# Patient Record
Sex: Female | Born: 1935 | Race: White | Hispanic: No | Marital: Married | Smoking: Never smoker
Health system: Southern US, Community
[De-identification: ages and names within clinical notes are randomized; demographics above are authoritative.]

## PROBLEM LIST (undated history)

## (undated) DIAGNOSIS — E785 Hyperlipidemia, unspecified: Secondary | ICD-10-CM

## (undated) DIAGNOSIS — I341 Nonrheumatic mitral (valve) prolapse: Secondary | ICD-10-CM

## (undated) DIAGNOSIS — Z950 Presence of cardiac pacemaker: Secondary | ICD-10-CM

## (undated) DIAGNOSIS — I4892 Unspecified atrial flutter: Secondary | ICD-10-CM

## (undated) DIAGNOSIS — I4891 Unspecified atrial fibrillation: Secondary | ICD-10-CM

## (undated) DIAGNOSIS — K219 Gastro-esophageal reflux disease without esophagitis: Secondary | ICD-10-CM

## (undated) DIAGNOSIS — I422 Other hypertrophic cardiomyopathy: Secondary | ICD-10-CM

## (undated) DIAGNOSIS — R569 Unspecified convulsions: Secondary | ICD-10-CM

## (undated) DIAGNOSIS — I495 Sick sinus syndrome: Secondary | ICD-10-CM

## (undated) DIAGNOSIS — C4491 Basal cell carcinoma of skin, unspecified: Secondary | ICD-10-CM

## (undated) DIAGNOSIS — Z8679 Personal history of other diseases of the circulatory system: Secondary | ICD-10-CM

## (undated) DIAGNOSIS — I471 Supraventricular tachycardia, unspecified: Secondary | ICD-10-CM

## (undated) DIAGNOSIS — I517 Cardiomegaly: Secondary | ICD-10-CM

## (undated) HISTORY — DX: Supraventricular tachycardia: I47.1

## (undated) HISTORY — DX: Presence of cardiac pacemaker: Z95.0

## (undated) HISTORY — PX: BASAL CELL CARCINOMA EXCISION: SHX1214

## (undated) HISTORY — DX: Sick sinus syndrome: I49.5

## (undated) HISTORY — DX: Supraventricular tachycardia, unspecified: I47.10

## (undated) HISTORY — DX: Other hypertrophic cardiomyopathy: I42.2

## (undated) HISTORY — DX: Hyperlipidemia, unspecified: E78.5

## (undated) HISTORY — DX: Basal cell carcinoma of skin, unspecified: C44.91

## (undated) HISTORY — DX: Personal history of other diseases of the circulatory system: Z86.79

## (undated) HISTORY — DX: Unspecified atrial flutter: I48.92

## (undated) HISTORY — DX: Gastro-esophageal reflux disease without esophagitis: K21.9

## (undated) HISTORY — PX: ABDOMINAL HYSTERECTOMY: SHX81

## (undated) HISTORY — DX: Cardiomegaly: I51.7

---

## 2001-04-03 ENCOUNTER — Encounter: Payer: Self-pay | Admitting: Emergency Medicine

## 2001-04-03 ENCOUNTER — Emergency Department (HOSPITAL_COMMUNITY): Admission: EM | Admit: 2001-04-03 | Discharge: 2001-04-03 | Payer: Self-pay | Admitting: Emergency Medicine

## 2002-03-29 ENCOUNTER — Encounter: Payer: Self-pay | Admitting: General Surgery

## 2002-03-30 ENCOUNTER — Observation Stay (HOSPITAL_COMMUNITY): Admission: RE | Admit: 2002-03-30 | Discharge: 2002-03-30 | Payer: Self-pay | Admitting: General Surgery

## 2002-03-30 ENCOUNTER — Encounter: Payer: Self-pay | Admitting: General Surgery

## 2006-07-12 ENCOUNTER — Ambulatory Visit: Payer: Self-pay | Admitting: *Deleted

## 2006-07-19 ENCOUNTER — Ambulatory Visit: Payer: Self-pay

## 2006-07-19 ENCOUNTER — Encounter: Payer: Self-pay | Admitting: Cardiovascular Disease

## 2006-07-19 ENCOUNTER — Ambulatory Visit: Payer: Self-pay | Admitting: *Deleted

## 2006-08-18 ENCOUNTER — Ambulatory Visit: Payer: Self-pay | Admitting: *Deleted

## 2009-03-28 ENCOUNTER — Encounter: Payer: Self-pay | Admitting: Internal Medicine

## 2009-04-04 ENCOUNTER — Encounter: Payer: Self-pay | Admitting: Internal Medicine

## 2009-05-18 ENCOUNTER — Encounter: Payer: Self-pay | Admitting: Internal Medicine

## 2009-07-06 ENCOUNTER — Encounter: Payer: Self-pay | Admitting: Internal Medicine

## 2009-09-11 ENCOUNTER — Encounter: Payer: Self-pay | Admitting: Internal Medicine

## 2009-10-01 ENCOUNTER — Ambulatory Visit: Payer: Self-pay | Admitting: Internal Medicine

## 2009-10-01 ENCOUNTER — Encounter (INDEPENDENT_AMBULATORY_CARE_PROVIDER_SITE_OTHER): Payer: Self-pay | Admitting: *Deleted

## 2009-10-01 DIAGNOSIS — R002 Palpitations: Secondary | ICD-10-CM

## 2009-10-01 HISTORY — DX: Palpitations: R00.2

## 2009-10-30 ENCOUNTER — Telehealth (INDEPENDENT_AMBULATORY_CARE_PROVIDER_SITE_OTHER): Payer: Self-pay | Admitting: *Deleted

## 2009-11-15 ENCOUNTER — Ambulatory Visit: Payer: Self-pay

## 2009-11-15 ENCOUNTER — Ambulatory Visit: Payer: Self-pay | Admitting: Internal Medicine

## 2010-01-06 ENCOUNTER — Telehealth: Payer: Self-pay | Admitting: Internal Medicine

## 2010-08-13 ENCOUNTER — Encounter: Payer: Self-pay | Admitting: Internal Medicine

## 2010-09-15 ENCOUNTER — Telehealth: Payer: Self-pay | Admitting: Internal Medicine

## 2010-10-07 ENCOUNTER — Telehealth: Payer: Self-pay | Admitting: Internal Medicine

## 2010-11-17 ENCOUNTER — Ambulatory Visit
Admission: RE | Admit: 2010-11-17 | Discharge: 2010-11-17 | Payer: Self-pay | Source: Home / Self Care | Attending: Internal Medicine | Admitting: Internal Medicine

## 2010-11-17 DIAGNOSIS — Q254 Other congenital malformations of aorta: Secondary | ICD-10-CM | POA: Insufficient documentation

## 2010-11-17 DIAGNOSIS — I10 Essential (primary) hypertension: Secondary | ICD-10-CM | POA: Insufficient documentation

## 2010-11-17 DIAGNOSIS — I471 Supraventricular tachycardia: Secondary | ICD-10-CM | POA: Insufficient documentation

## 2010-12-01 ENCOUNTER — Encounter: Payer: Self-pay | Admitting: Internal Medicine

## 2010-12-01 ENCOUNTER — Ambulatory Visit: Admission: RE | Admit: 2010-12-01 | Discharge: 2010-12-01 | Payer: Self-pay | Source: Home / Self Care

## 2010-12-02 NOTE — Progress Notes (Signed)
Summary: Carla Flowers** Monitor report   Phone Note Call from Patient Call back at Home Phone [phone] Call back at [phone]   Caller: Patient Reason for Call: Talk to Nurse Summary of Call: wants to make sure Monitor report was faxed to Dr Joellyn Quails Initial call taken by: Migdalia Dk,  January 06, 2010 12:24 PM  Follow-up for Phone Call        Pt wanting monitor results sent to another MD. The results I received is only for a baseline transmission. Per Carla Flowers, she was told the pt didn't wear the monitor and sent it back. Per the pt she wore it from Jan. 14 until Feb 11th or 12th. I will check with Carla Flowers and call the pt back.  Follow-up by: Duncan Dull, RN, BSN,  January 06, 2010 12:53 PM  Additional Follow-up for Phone Call Additional follow up Details #1::        Carla Flowers called the company and per the company the pt did not send in any events and they never received the monitor back. Passed this information along to the pt and she said she did not hit the button because she never had any events. I explained to her if there are no events then there is nothing to send to another MD. I gave her the phone number [phone] to call the company. She states she did send it back.  Additional Follow-up by: Duncan Dull, RN, BSN,  January 07, 2010 9:31 AM     Appended Document: changed provider to see dr. Vertell Novak - this patient apparently wants to switch over to see me. I don't know her and her issues look to be rhythm related. I'm not inclined to see her unless there's something I'm missing? Kathlene November  Appended Document: change provider spoke w/pt and explained that her needs required an EP Cardiologist. Pt very concerned that Dr. Excell Seltzer would not want to see her on 1/5  and will call back. Claris Gladden, RN, BSN

## 2010-12-02 NOTE — Assessment & Plan Note (Signed)
Summary: 6WK F/U EVENT MONITOR   CC:  6 week follow up on event monitor.  Pt did not get the event monitor as she wanted to wait until after Christmas.  .  History of Present Illness:  Mrs Erkkila is seen in followup for "SVT" and palpitatoins, the latter which have been quite disruptive to life. An event recorder scheduled was delayed by the patient untila fter Christmas, but she came anyway.l  ASerendipitously she had another episode of her "SVT aweek or so ago, but what she describes is 180bpm with BP 180 and spell that lasted 15 minutes or so, but in fact was 5 secs with a pause follwoed by 5-10 sec with a pause     We reviewed records from [facility] which unfortunately do not have any of the rhythm issues to rview  Current Medications (verified): 1)  Simvastatin 20 Mg Tabs (Simvastatin) .... Take One Tablet At Bedtime 2)  Coenzyme Q10 100 Mg Caps (Coenzyme Q10) .... Take One Capsule Once Daily 3)  Vitamin D (Ergocalciferol) 50000 Unit Caps (Ergocalciferol) .... Take One Capsule Once Weekly 4)  Metoprolol Succinate 25 Mg Xr24h-Tab (Metoprolol Succinate) .... Take One Tablet Once Daily 5)  Mag-Ox 400 400 Mg Tabs (Magnesium Oxide) .... Take One Tablet Daily 6)  Levetiracetam 500 Mg Tabs (Levetiracetam) .... Take One Tablet Two Times A Day 7)  Aspirin 325 Mg Tabs (Aspirin) .... Once Daily  Allergies (verified): 1)  ! Sulfa 2)  ! Cortisone 3)  ! Macrobid 4)  ! Codeine  Past History:  Past Medical History: Last updated: 10/01/2009 hyperlipidemia seizure disorder??   PSVT GERD Hx recurrent UTI and gallstones palpitations question PVCs rheumatic fever  Past Surgical History: Last updated: 10/01/2009 hysterectomy  Family History: Last updated: 09/30/2009 Son with Epilepsy  Social History: Last updated: 10/01/2009 Married-one son Retired  Tobacco Use - No.  Alcohol Use - no  Vital Signs:  Patient profile:   77 year old female Height:      65 inches Weight:       162 pounds Pulse rate:   66 / minute Pulse rhythm:   regular BP sitting:   128 / 78  (left arm) Cuff size:   regular  Vitals Entered By: Judithe Modest CMA (November 15, 2009 2:15 PM)  Physical Exam  General:  The patient was alert and oriented in no acute distress. HEENT Normal.  Neck veins were flat, carotids were brisk.  Lungs were clear.  Heart sounds were regular without murmurs or gallops.  Abdomen was soft with active bowel sounds. There is no clubbing cyanosis or edema. Skin Warm and dry    Impression & Recommendations:  Problem # 1:  PALPITATIONS (ICD-785.1) We will get hte event recorder.  The data from [facility] were unhelpful  The interrupted tachypalpiations could be anything, atrail tach, fib, svt  will have to wait and see Her updated medication list for this problem includes:    Metoprolol Succinate 25 Mg Xr24h-tab (Metoprolol succinate) .Marland Kitchen... Take one tablet once daily    Aspirin 325 Mg Tabs (Aspirin) ..... Once daily  Patient Instructions: 1)  HAVE MONITOR PLACED TODAY. 2)  Your physician recommends that you schedule a follow-up appointment in: 5 WEEKS

## 2010-12-02 NOTE — Progress Notes (Signed)
Summary: Questions about appt  Phone Note Call from Patient Call back at Home Phone [phone] Call back at [phone]   Caller: Patient Summary of Call: Pt calling with questions about seeing Dr.Cooper Initial call taken by: Judie Grieve,  October 07, 2010 9:16 AM  Follow-up for Phone Call        pt will see another EP MD in the practice and have scheduled an appt for 1/16 w/Dr. Johney Frame.  Follow-up by: Claris Gladden RN,  October 07, 2010 9:34 AM

## 2010-12-04 NOTE — Progress Notes (Addendum)
Summary: change Carla Flowers  Phone Note Call from Patient Call back at Home Phone [phone]   Caller: Patient Reason for Call: Talk to Nurse Details for Reason: pt would like to changed from dr. Graciela Husbands to see dr. Excell Seltzer. pls advise.  Initial call taken by: Lorne Skeens,  September 15, 2010 9:50 AM  Follow-up for Phone Call        spoke w/pt and adv that it is ok to switch to Dr. Excell Seltzer. Follow-up by: Claris Gladden RN,  September 15, 2010 11:48 AM     Appended Document: changed Carla Flowers to see dr. Vertell Novak - this patient apparently wants to switch over to see me. I don't know her and her issues look to be rhythm related. I'm not inclined to see her unless there's something I'm missing? Kathlene November  Appended Document: change Carla Flowers spoke w/pt and explained that her needs required an EP Cardiologist. Pt very concerned that Dr. Excell Seltzer would not want to see her on 1/5  and will call back. Claris Gladden, RN, BSN

## 2010-12-04 NOTE — Assessment & Plan Note (Signed)
Summary: nep-previously saw Dr. Graciela Husbands   Visit Type:  Follow-up Primary Provider:  Dr Fernande Boyden   History of Present Illness: The patient presents today for routine electrophysiology followup. She was last seen by Dr Graciela Husbands one year ago for adenosine sensitive SVT.  She reports doing very well with metoprolol and has had no further symptomatic SVT over the past year.   The patient denies symptoms of palpitations, chest pain, shortness of breath, orthopnea, PND, lower extremity edema, dizziness, presyncope, syncope, or neurologic sequela. The patient is tolerating medications without difficulties and is otherwise without complaint today.   She recently had an echo obtained by her PCP which revealed EF 60-65% with mild asymmetric septal hypertrophy (septum 1.3-1.5cm) with no sam or LVOT obstruction.  She also had diastolic dysfunction.  Her RV size and function were normal.  She was noted to have mild MR with redundant subvalvular apparatus.  Her ascending aorta was 3.7cm in size.  She is referred back to our office for further evaluation of these findings.  Current Medications (verified): 1)  Simvastatin 20 Mg Tabs (Simvastatin) .... Take One Tablet At Bedtime 2)  Coenzyme Q10 100 Mg Caps (Coenzyme Q10) .... Take One Capsule Once Daily 3)  Vitamin D (Ergocalciferol) 50000 Unit Caps (Ergocalciferol) .... Take One Capsule Once Weekly 4)  Metoprolol Succinate 25 Mg Xr24h-Tab (Metoprolol Succinate) .... Take One Tablet Once Daily 5)  Mag-Ox 400 400 Mg Tabs (Magnesium Oxide) .... Take One Tablet Daily 6)  Levetiracetam 500 Mg Tabs (Levetiracetam) .... Take One Tablet Two Times A Day 7)  Aspirin 325 Mg Tabs (Aspirin) .... Once Daily  Allergies: 1)  ! Sulfa 2)  ! Cortisone 3)  ! Macrobid 4)  ! Codeine  Past History:  Past Medical History: hyperlipidemia seizure disorder??   PSVT prevsiously responded to adenosine GERD Hx recurrent UTI and gallstones palpitations question  PVCs rheumatic fever ascending aorta 3.7cm asymmetric septal hypertrophy on echo  09/03/10  Past Surgical History: Reviewed history from 10/01/2009 and no changes required. hysterectomy  Family History: Son with Epilepsy denies h/o arrhythmia, sudden death, aortic aneurysm, or hypertrophy  Social History: Reviewed history from 10/01/2009 and no changes required. Married-one son Retired  Tobacco Use - No.  Alcohol Use - no  Review of Systems       All systems are reviewed and negative except as listed in the HPI.   Vital Signs:  Patient profile:   77 year old female Height:      65 inches Weight:      163 pounds BMI:     27.22 Pulse rate:   64 / minute BP sitting:   146 / 70  (left arm)  Vitals Entered By: Laurance Flatten CMA (November 17, 2010 9:01 AM)  Physical Exam  General:  Well developed, well nourished, in no acute distress. Head:  normocephalic and atraumatic Eyes:  PERRLA/EOM intact; conjunctiva and lids normal. Mouth:  Teeth, gums and palate normal. Oral mucosa normal. Neck:  Neck supple, no JVD. No masses, thyromegaly or abnormal cervical nodes. Lungs:  Clear bilaterally to auscultation and percussion. Heart:  Non-displaced PMI, chest non-tender; regular rate and rhythm, S1, S2 without murmurs, rubs or gallops. Carotid upstroke normal, no bruit. Normal abdominal aortic size, no bruits. Femorals normal pulses, no bruits. Pedals normal pulses. No edema, no varicosities. Abdomen:  Bowel sounds positive; abdomen soft and non-tender without masses, organomegaly, or hernias noted. No hepatosplenomegaly. Msk:  Back normal, normal gait. Muscle strength and tone normal. Extremities:  No clubbing or cyanosis. Neurologic:  Alert and oriented x 3. Skin:  Intact without lesions or rashes. Psych:  Normal affect.   EKG  Procedure date:  09/03/2010  Findings:      EF 60-65% with mild asymmetric septal hypertrophy (septum 1.3-1.5cm) with no sam or LVOT obstruction.  She  also had diastolic dysfunction.  Her RV size and function were normal.  She was noted to have mild MR with redundant subvalvular apparatus.  Her ascending aorta was 3.7cm in size.   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  We will perform ultrasound to formally evaluate her aorta. We will continue beta blocker therapy.  Problem # 3:  HYPERTENSION, BENIGN (ICD-401.1) stable her echo reveals mild asymmetric septal LVH, though she does not have obstructive CM We will continue to follow with serial echos We will likely repeat echo upon return.  Other Orders: Abdominal Aorta Duplex (Abd Aorta Duplex)  Patient Instructions: 1)  Your physician wants you to follow-up in: 6 months with Dr Jacquiline Doe will receive a reminder letter in the mail two months in advance. If you don't receive a letter, please call our office to schedule the follow-up appointment. 2)  Your physician has requested that you have an abdominal aorta duplex. During this test, an ultrasound is used to evaluate the aorta. Allow 30 minutes for this exam. Do not eat after midnight the day before and avoid carbonated beverages. There are no restrictions or special instructions. 3)  Your physician has requested that you limit the intake of sodium (salt) in your diet to four grams daily. Please see [facility] handout.

## 2010-12-08 ENCOUNTER — Telehealth: Payer: Self-pay | Admitting: Internal Medicine

## 2010-12-10 NOTE — Letter (Signed)
Summary: [facility] 2010-2011  [facility] 2010-2011   Imported By: Marylou Mccoy 11/27/2010 12:30:46  _____________________________________________________________________  External Attachment:    Type:   Image     Comment:   External Document

## 2010-12-18 NOTE — Progress Notes (Signed)
Summary: pt rtn call from last week  Phone Note Call from Patient Call back at [phone]   Caller: Patient Reason for Call: Talk to Nurse, Talk to Doctor, Lab or Test Results Summary of Call: pt rtn call from friday to get test results Initial call taken by: Omer Jack,  December 08, 2010 8:41 AM  Follow-up for Phone Call        Phone Call Completed PT AWARE OF U/S RESULTS. Follow-up by: Scherrie Bateman, LPN,  December 08, 2010 8:48 AM

## 2011-03-20 NOTE — Consult Note (Signed)
[facility]  Patient:    Carla Flowers, Carla Flowers Visit Number: [id number] MRN: [id number]          Service Type: SUR Location: 4W [id number] Attending Physician:  Henrene Dodge Dictated by:   Oley Balm Sung Amabile, M.D. [facility] Proc. Date: 03/30/02 Admit Date:  03/30/2002 Discharge Date: 03/30/2002   CC:         Anselm Pancoast. Zachery Dakins, M.D.  Jamey Reas, M.D., [facility], [address], [address],             [place], [place] [address]   Consultation Report  DATE OF BIRTH:  20-Aug-1932  REQUESTING PHYSICIAN:  Anselm Pancoast. Zachery Dakins, M.D.  REASON FOR CONSULTATION:  Right lower lobe infiltrate on preoperative chest x-ray.  HISTORY OF PRESENT ILLNESS:  Carla Flowers is a previously healthy 77 year old woman whose history of present illness dates back to three or four days ago when she developed right upper quadrant and right lower thoracic pain.  This was a sharp pain radiating to her right shoulder.  It was associated with nausea and vomiting.  She had fevers to 101 degrees.  She was seen in the emergency department at [facility] in [place], [place].  At that time, apparently no chest x-ray was performed.  She states that she did have "blood work" done and was told that she suffered from a gallbladder problem.  She was started on Augmentin and saw her Primary Care Physician, Dr. Cyndia Bent, on the following day.  Dr. Cyndia Bent referred her to Dr. Zachery Dakins on the day prior to this consultation, and Dr. Zachery Dakins felt that she needed cholecystectomy.  Preoperative chest x-ray and blood work were performed yesterday and she presented for surgery on the morning of this consultation. The anesthesiologist noted the right lower lobe infiltrate on chest x-ray and was concerned about pneumonia.  Subsequently, an ultrasound of the gallbladder was obtained as was a CT scan of the chest to better delineate the  primary process here.  I am asked to decide on proper management of apparent pneumonia now.  The patient describes no hemoptysis or sputum production.  She describes pleuritic chest pain underneath her right breast and radiating to her right shoulder.  She has no lower extremity edema or calf tenderness.  PAST MEDICAL HISTORY:  Also notable for mitral valve prolapse and gastroesophageal reflux disease.  CURRENT MEDICATIONS: 1. Protonix. 2. Premarin. 3. Multivitamin.  ALLERGIES: 1. MACROBID. 2. SULFA. 3. ERYTHROMYCIN and related medications.  SOCIAL HISTORY:  No significant occupational exposures.  She has never smoked.  FAMILY HISTORY:  Noncontributory.  REVIEW OF SYSTEMS:  As per the history of present illness, and otherwise is negative.  PHYSICAL EXAMINATION:  VITAL SIGNS:  Presently afebrile with normal vital signs.  Room air oxygen saturation is 89%.  GENERAL:  She is in no acute cardiac or respiratory distress.  HEAD/NECK:  No acute abnormalities.  Jugular venous pulsations are normal. There is no palpable adenopathy.  CHEST:  Dullness to percussion in the right base.  Breath sounds are full with faint crackles heard in the left base, and bronchial breath sounds noted in the right base.  No wheezes are noted.  CARDIAC:  Regular rate and rhythm with a soft systolic murmur heard along the left sternal border and radiating to the apex.  ABDOMEN:  Soft with normal bowel sounds.  Murphy sign is positive.  EXTREMITIES:  No cyanosis, clubbing or edema.  NEUROLOGIC:  No deficits.  DATA:  CHEST X-RAY:  Right lower lobe atelectasis versus infiltrate.  CT SCAN:  Very definite consolidative changes in the right lower lobe and right middle lobe with air bronchograms.  There is a very small right pleural effusion also noted.  GALLBLADDER ULTRASOUND:  Reviewed with radiology and she has gallstones but no definite findings of acute inflammation.  IMPRESSION:  She  either has cholecystitis with right lower lobe atelectasis and a sympathetic right effusion, or her initial presentation is explained by right lower lobe and right middle lobe pneumonia.  Given the gallbladder findings and the CT scan findings, I favor that this is pneumonia.  Of course, she does require cholecystectomy at some point due to the gallstones.  PLAN/RECOMMENDATIONS: 1. Tequin 400 mg a day for seven days. 2. I recommend that we postpone her procedure until early next week.  I have    discussed this with Dr. Zachery Dakins.  We both feel comfortable sending her    home as long as we assure her that she must call back if her symptoms worse    at any time.  She is to call back to Dr. Vella Redhead office on the day    following this consultation to finalize the plans for cholecystectomy next    week.  It is important to note that her chest x-ray will not have cleared    by that time but she will have had sufficient antibiotics in her to safely    undergo cholecystectomy.  If there is any question regarding this, I am    more than happy to see her again in consultation. 3. She does have quite severe pleuritic chest pain.  I recommend that she use    an over the counter nonsteroidal such as Aleve or Advil.  In addition, I    will give her a small amount of Percocet for pain that breaks through the    nonsteroidals.  She also might require the opiate analgesics to achieve a    good nights rest. Dictated by:   Oley Balm. Sung Amabile, M.D. [facility] Attending Physician:  Henrene Dodge DD:  03/30/02 TD:  03/31/02 Job: [id number]

## 2011-03-20 NOTE — Assessment & Plan Note (Signed)
[facility]                              CARDIOLOGY OFFICE NOTE   NAME:Flowers, Carla FOODY                     MRN:          [id number]  DATE:07/12/2006                            DOB:          1932-05-17    The patient is a very pleasant, 77 year old, white female, with a long  history of mitral valve prolapse, tachy-palpitations, which have increased  recently.  She was most recently seen in the Cardiology Clinic by Dr. Loraine Leriche  Pulsipher.  She also has some hypertension.  Most recent stress Cardiolite  in 2001 was negative.  The patient states that she has noticed increasing  symptoms of palpitations with some presyncopal symptoms.  This starts out as  skipped beats and then appears to be a regular rapid rate.  She states that  cold milk helps that tentatively.  It usually slows initially and then  stops.  She has no chest discomfort.   PAST MEDICAL HISTORY:  1. Total hysterectomy in 1986.  2. Polypectomies in the past.  3. Hypertension in the past three months.   ALLERGIES:  1. SULFA.  2. MACROBID.  3. CORTISONE.   She does not smoke, drink, or use other drugs.  Her only medication is  aspirin 81.   REVIEW OF SYSTEMS:  Dizziness and presyncope most of her life sometimes  associated with arrhythmia.  ENT:  History of tinnitus.  CARDIORESPIRATORY:  As noted in the Present Illness.  GI:  Constipation and reflux; gallstones.  GU:  Frequency.  GYNECOLOGIC:  Complete hysterectomy.  Weight has been  stable.  MUSCULOSKELETAL:  History of arthritis.  Seasonal allergies.  The  patient has had difficulty with her memory recently.  She has had  hyperlipidemia but is not agreeable to taking a Statin as yet.   SOCIAL HISTORY:  She is retired.  She is married and has four children.   FAMILY HISTORY:  Her father died of a CVA at 11.  Mother died at 31 of heart  failure and hypertension.  One brother died in a motor vehicle accident.  One sister is 73 and  has mitral valve prolapse.   PHYSICAL EXAMINATION:  VITAL SIGNS:  Blood pressure 160/88, pulse 64 and in  normal sinus rhythm.  GENERAL APPEARANCE:  Normal.  NECK:  JVD is not elevated.  Carotid pulses are palpable without bruit.  LUNGS:  Clear.  CARDIAC:  1/6 short systolic murmur at the apex, I do not hear a click or a  gallop.  ABDOMEN:  Unremarkable.  EXTREMITIES:  Normal.  NEUROLOGIC:  Unremarkable.   ADDITIONAL DATA:  Laboratory data from [facility] reveals a  normal renal profile.  Enzymes were negative.  EKG revealed nonspecific ST T  abnormality in 1 and aVL, and the lateral leads.  Chest x-ray was  unremarkable.  CT scan of the head revealed no significant change.  A  carotid duplex ultrasound revealed no carotid disease or no hemodynamically  significant lesions.   IMPRESSION:  1. Tachy-palpitations.  2. History of mitral valve prolapse not documented recently.  3. Hyperlipidemia, untreated.  4.  Hypertension.   The patient is scheduled for a stress test next week.  I also plan to get a  two-D echo and an event monitor, and I will see the patient back in two to  three weeks.  At that time, hopefully we can initiate a Statin as well as  antihypertensive therapy, and document her arrhythmia.                              Cecil Cranker, MD, [facility]    EJL/MedQ  DD:  07/12/2006  DT:  07/12/2006  Job #:  [id number]   cc:   Carla Flowers, M.D.

## 2011-03-20 NOTE — H&P (Signed)
[facility]  Patient:    Carla Flowers, Carla Flowers Visit Number: [id number] MRN: [id number]          Service Type: SUR Location: [id number] Attending Physician:  Henrene Dodge Dictated by:   Anselm Pancoast. Zachery Dakins, M.D. Admit Date:  03/30/2002 Discharge Date: 03/30/2002                           History and Physical  CHIEF COMPLAINT:  Gallstones with pain.  HISTORY OF PRESENT ILLNESS:  Carla Flowers is a 77 year old Caucasian female who lives now in [place], previously lived here in [place], and has a known history of gallstones since an x-ray in 1997.  The patient started having pain in the upper right quadrant of the abdomen on Sunday, and the following day had no improvement.  She was seen at the [facility] by Dr. Cyndia Bent who describes severe pain in the right upper abdomen under the rib cage and radiating towards her back, hurts to breathe, and made the diagnosis of acute cholelithiasis.  White count was 13,400.  She desired to come to [facility] for gallbladder surgery.  She was seen in the office yesterday afternoon at approximately 4 p.m.  On examination, she was afebrile with tenderness in the right upper quadrant of her abdomen.  She did not appear toxic.  She has been on Augmentin, she thinks 500 mg q.8h, is probably 875 mg.  She does not have the tablets.  She wanted to proceed with gallbladder management.  The patient questioned whether or not the stones could still be present.  I assured her they were, but she desired a repeat ultrasound, and I sent her over to x-ray for her preoperative lab studies, chest x-ray, plus I made arrangements to repeat her ultrasound of her gallbladder.  This was performed.  The gallbladder shows a big dilated gallbladder with numerous stones, one impacted in the neck.  The ultrasound otherwise was normal in regards to her liver.  Chest x-ray showed atelectasis versus early pneumonia  right lower lobe, and the x-ray report was called late in the evening.  I received a message at approximately midnight.  Dr. Gladis Riffle, the anesthesiologist, stated at that time that he was going to counsel her, and I recommended we reassess her in the morning when she arrives for her surgery as she had been scheduled for 7:30, and make the decision at that time.  The patient arrived.  Her laboratory studies done yesterday showed a white count of 12,400.  She does not smoke cigarettes.  She has not had a recent chest x-ray, and she has not had any sputum production or shortness of breath.  She says it hurts when she takes a deep breath, and the radiologist, Dr. Ronney Asters, today interpreted the films as atelectasis of the right lower lobe, cannot rule out pneumonia, but not that of a pneumonia as was questioned by the radiologist last night.  The patient has had no chronic problems with pneumonias definite COPD.  SOCIAL HISTORY:  She used to work at [employer].  I think her husband also worked at [employer], but they retired and moved to the [place], [place], area several years ago.  PAST SURGICAL HISTORY:  She has had GYN surgery through a lower midline incision, hysterectomy.  PRESENT MEDICATIONS: 1. Premarin 0.625 mg daily. 2. Protonix daily.  PAST MEDICAL HISTORY:  There is a questionable history of hiatal hernia.  ALLERGIES:  She says CORTISONE, SULFA, and "MYCINS" cause problems.  REVIEW OF SYSTEMS:  Musculoskeletal: She denies problems.  Urinary: She denies urinary symptoms.  GI: She does have a history of diverticulosis but says she has not had any episodes of acute diverticulitis or been treated with antibiotics for lower abdominal pain.  PHYSICAL EXAMINATION:  GENERAL:  She is an elderly female, cooperative, in no acute distress at this time.  VITAL SIGNS:  Here in the room, temperature 97.2, pulse 80, respirations 16, blood pressure 130/90, O2 saturation 93%.   She  is 5 feet 3 inches in height and weighs 161 pounds.  HEENT:  Appears adequately hydrated.  There is no cough or sputum production.  NECK:  No cervical, supraclavicular, or lymphadenopathy.  LUNGS:  Clear breath sounds with the exception that she has decreased to absent breath sounds in the right posterior base.  CARDIAC:  Normal sinus rhythm.  ABDOMEN:  She has a flat abdomen.  She is mildly tender with pressure in the right upper quadrant, kind of a weak Murphy sign.  She was described as having a fairly marked Murphy sign when she was seen in the physicians office on Monday.  The lower abdomen is not tender.  RECTAL:  No blood.  EXTREMITIES:  Lower extremities have adequate peripheral pulses.  No pedal edema.  CNS:  Physiologic.  DIAGNOSTIC DATA:  Laboratory studies this morning include white count of 12,600, hematocrit 37.8.  Urinalysis not done.  PT and PTT are normal.  CMET normal with glucose 123.  Liver function studies all normal with BUN 7, creatinine 0.9.  ADMISSION IMPRESSION: 1. Chronic cholelithiasis with subacute cholecystitis with secondary    splinting. 2. Probable atelectasis secondary to #1.  Doubt this is a primary pneumonia.  PLAN:  Since anesthesia refuses to put her to sleep because of the questionable findings, we have arranged for Dr. Sherene Sires or associates to see her. She has been started on antibiotics; I am going to treat her with Zosyn. Attempted to get a sputum production, but the respiratory therapist who worked with her said there is no sputum to produce, and this was after an incentive spirometer, flutter valve treatment, etc.  Dr. Sherene Sires has recommended that we do a spiral CT.  Question is whether or not the patient will be added back to the OR schedule later today after it has been decided that she does or does not have a pneumonia.  I favor this being atelectasis secondary to splinting from acute gallbladder.  This is now the fifth day that the  patient has had gallbladder pain. Dictated by:   Anselm Pancoast. Zachery Dakins, M.D.  Attending Physician:  Henrene Dodge DD:  03/30/02 TD:  03/31/02 Job: [id number]

## 2011-03-20 NOTE — Letter (Signed)
August 18, 2006     Larita Fife, M.D.  [address], [place]  [address]   RE:  [name], [name]  MRN:  [id number]  /  DOB:  [birth year]   Dear Dr. Cyndia Bent,   It was pleasure to see this patient, Carla Flowers, for followup.  As you  know, she is a very pleasant, 77 year old, white female with  tachypalpitations, systolic murmur, and hypertension related to  hyperlipidemia.  Following her cardiac evaluation July 12, 2006, she  underwent a stress Myoview which was normal.  The echocardiogram revealed  normal LV function with mild increase in LV wall thickness.  There was mild  aortic dilatation without mitral regurgitation.  No evidence of mitral valve  prolapse.  The patient states she has felt much better, has had no  tachypalpitations at her visit.  Baseline event monitor revealed no  arrhythmia.   PHYSICAL EXAM:  Blood pressure 120/82.  Pulse 60, normal sinus rhythm.  GENERAL APPEARANCE:  Normal.  JVD is not elevated.  Carotid pulses are palpated without bruits.  LUNGS:  Clear.  CARDIAC:  A 1/6 short systolic ejection murmur at the aortic area.   1. Hyperlipidemia.  2. Labile hypertension.  3. History of tachypalpitations but none in the past month or so.   I have reassured the patient concerning this.  I have suggested that she see  you for followup in order to be started on a statin as you recommended.        Should she have any recurrent symptoms, I would be happy to see her at any  other time in the future.  Thank you for the opportunity to share this nice  patient's care.    Sincerely,     ______________________________  E. Graceann Congress, MD, [facility]    EJL/MedQ  /  Job #:  [id number]  DD:  08/18/2006 / DT:  08/19/2006

## 2011-12-10 DIAGNOSIS — E559 Vitamin D deficiency, unspecified: Secondary | ICD-10-CM | POA: Insufficient documentation

## 2011-12-10 DIAGNOSIS — I341 Nonrheumatic mitral (valve) prolapse: Secondary | ICD-10-CM | POA: Insufficient documentation

## 2012-01-06 ENCOUNTER — Other Ambulatory Visit: Payer: Self-pay

## 2012-01-06 ENCOUNTER — Emergency Department (HOSPITAL_COMMUNITY)
Admission: EM | Admit: 2012-01-06 | Discharge: 2012-01-07 | Disposition: A | Discharge status: Home / Self Care | Payer: Medicare Other | Attending: Emergency Medicine | Admitting: Emergency Medicine

## 2012-01-06 ENCOUNTER — Encounter (HOSPITAL_COMMUNITY): Payer: Self-pay | Admitting: Emergency Medicine

## 2012-01-06 DIAGNOSIS — R079 Chest pain, unspecified: Secondary | ICD-10-CM | POA: Insufficient documentation

## 2012-01-06 DIAGNOSIS — R002 Palpitations: Secondary | ICD-10-CM | POA: Insufficient documentation

## 2012-01-06 DIAGNOSIS — G40909 Epilepsy, unspecified, not intractable, without status epilepticus: Secondary | ICD-10-CM | POA: Insufficient documentation

## 2012-01-06 DIAGNOSIS — R0602 Shortness of breath: Secondary | ICD-10-CM | POA: Insufficient documentation

## 2012-01-06 DIAGNOSIS — R000 Tachycardia, unspecified: Secondary | ICD-10-CM | POA: Insufficient documentation

## 2012-01-06 DIAGNOSIS — I471 Supraventricular tachycardia: Secondary | ICD-10-CM

## 2012-01-06 HISTORY — DX: Unspecified convulsions: R56.9

## 2012-01-06 HISTORY — DX: Nonrheumatic mitral (valve) prolapse: I34.1

## 2012-01-06 LAB — CBC
HCT: 43.5 % (ref 36.0–46.0)
Hemoglobin: 14.7 g/dL (ref 12.0–15.0)
RBC: 4.73 MIL/uL (ref 3.87–5.11)
RDW: 12.8 % (ref 11.5–15.5)
WBC: 11 10*3/uL — ABNORMAL HIGH (ref 4.0–10.5)

## 2012-01-06 LAB — POCT I-STAT TROPONIN I: Troponin i, poc: 0.05 ng/mL (ref 0.00–0.08)

## 2012-01-06 LAB — DIFFERENTIAL
Basophils Absolute: 0 10*3/uL (ref 0.0–0.1)
Lymphocytes Relative: 21 % (ref 12–46)
Monocytes Absolute: 0.4 10*3/uL (ref 0.1–1.0)
Neutro Abs: 8.2 10*3/uL — ABNORMAL HIGH (ref 1.7–7.7)

## 2012-01-06 LAB — BASIC METABOLIC PANEL
Chloride: 104 mEq/L (ref 96–112)
Creatinine, Ser: 1.08 mg/dL (ref 0.50–1.10)
GFR calc Af Amer: 55 mL/min — ABNORMAL LOW (ref >90)
Potassium: 4.1 mEq/L (ref 3.5–5.1)
Sodium: 140 mEq/L (ref 135–145)

## 2012-01-06 MED ORDER — ADENOSINE 6 MG/2ML IV SOLN
INTRAVENOUS | Status: AC
  Filled 2012-01-06: qty 4

## 2012-01-06 MED ORDER — DILTIAZEM HCL 25 MG/5ML IV SOLN
INTRAVENOUS | Status: AC
  Administered 2012-01-06 (×2): 15 mg via INTRAVENOUS
  Filled 2012-01-06: qty 5

## 2012-01-06 NOTE — ED Notes (Signed)
PT. REPORTS SUBSTERNAL CHEST PAIN WITH PALPITATIONS AND VOMITTING ONSET THIS EVENING , DENIES DIAPHORESIS OR COUGH.

## 2012-01-06 NOTE — ED Notes (Signed)
15 mg Diltiazem bolus did not work.  Pushing second 15 mg bolus per Dr Weldon Inches.

## 2012-01-06 NOTE — ED Notes (Signed)
HR responding with 2nd bolus of 15 mg.  Reducing from 170's to 80's.  Pt stating she feels better.

## 2012-01-07 MED ORDER — DILTIAZEM HCL 50 MG/10ML IV SOLN
15.0000 mg | Freq: Once | INTRAVENOUS | Status: DC
  Administered 2012-01-06: 15 mg via INTRAVENOUS

## 2012-01-07 MED ORDER — DILTIAZEM HCL 50 MG/10ML IV SOLN: 15.0000 mg | Freq: Once | INTRAVENOUS | Status: DC

## 2012-01-07 MED ORDER — DILTIAZEM HCL 100 MG IV SOLR
5.0000 mg/h | INTRAVENOUS | Status: AC
  Administered 2012-01-07: 5 mg/h via INTRAVENOUS
  Filled 2012-01-07: qty 100

## 2012-01-07 MED ORDER — METOPROLOL TARTRATE 50 MG PO TABS: ORAL_TABLET | ORAL | Status: DC

## 2012-01-07 MED ORDER — DILTIAZEM LOAD VIA INFUSION
15.0000 mg | Freq: Once | INTRAVENOUS | Status: DC
  Filled 2012-01-07: qty 15

## 2012-01-07 NOTE — Discharge Instructions (Signed)
Your heart rate is controlled.  Increase your Toprol to 25 mg per day.  Followup with your cardiologist, for reevaluation.  Return for worse or uncontrolled symptoms

## 2012-01-07 NOTE — ED Notes (Signed)
Depart entered on wrong patient.

## 2012-01-07 NOTE — ED Provider Notes (Signed)
History     CSN: [id number]  Arrival date & time 01/06/12  2312   First MD Initiated Contact with Patient 01/06/12 2323      Chief Complaint  Patient presents with  . Chest Pain    (Consider location/radiation/quality/duration/timing/severity/associated sxs/prior treatment) Patient is a 78 y.o. female presenting with chest pain. The history is provided by the patient. The history is limited by the condition of the patient.  Chest Pain Primary symptoms include shortness of breath and palpitations. Pertinent negatives for primary symptoms include no fever, no nausea and no vomiting.  The palpitations also occurred with shortness of breath.    the patient is a 78 year old, female, with a history of mitral valve prolapse, and seizures, who presents to emergency department complaining of a rapid heartbeat and chest pain, with mild shortness of breath.  She denies nausea, vomiting, diaphoresis, leg pain or swelling.  Her symptoms began while she was eating dinner approximately 3 hours prior to arrival in the emergency department.  She has had similar episodes in past and uses 12-1/2 mg a month.  Toprol to control her heart rate.  She denies recent illness.  She does not use caffeinated beverages or smoke cigarettes.  She denies lightheadedness.  Level V caveat applies for urgent need for intervention  Past Medical History  Diagnosis Date  . Mitral valve prolapse   . Seizure     Past Surgical History  Procedure Date  . Abdominal hysterectomy     No family history on file.  History  Substance Use Topics  . Smoking status: Not on file  . Smokeless tobacco: Not on file  . Alcohol Use:     OB History    Grav Para Term Preterm Abortions TAB SAB Ect Mult Living                  Review of Systems  Constitutional: Negative for fever and chills.  Respiratory: Positive for shortness of breath.   Cardiovascular: Positive for chest pain and palpitations.  Gastrointestinal: Negative  for nausea and vomiting.  Neurological: Negative for headaches.  Psychiatric/Behavioral: Negative for confusion.  All other systems reviewed and are negative.    Allergies  Codeine; Cortisone; Nitrofurantoin; and Sulfonamide derivatives  Home Medications  No current outpatient prescriptions on file.  BP 101/56  Pulse 64  Resp 16  SpO2 96%  Physical Exam  Vitals reviewed. Constitutional: She is oriented to person, place, and time. She appears well-developed and well-nourished.  HENT:  Head: Normocephalic and atraumatic.  Eyes: Pupils are equal, round, and reactive to light.  Neck: Normal range of motion.  Cardiovascular: Regular rhythm and normal heart sounds.   No murmur heard.      Tachycardia  Pulmonary/Chest: Effort normal and breath sounds normal. No respiratory distress. She has no wheezes. She has no rales.  Abdominal: Soft. She exhibits no distension and no mass. There is no tenderness. There is no rebound and no guarding.  Musculoskeletal: Normal range of motion. She exhibits no edema and no tenderness.  Neurological: She is alert and oriented to person, place, and time. No cranial nerve deficit.  Skin: Skin is warm and dry. No rash noted. No erythema.  Psychiatric: She has a normal mood and affect. Her behavior is normal.    ED Course  Procedures (including critical care time) The patient is a 78 year old female, who presents with PSVT.  After auscultating her carotid arteries and not hearing, elderly.  I attempted to perform carotid massage.  This was ineffective in controlling her heart rate.  Then, I had the patient Valsalva, it also was ineffective.  We gave the patient.  15 mg bolus of diltiazem.  Her heart rate, then decreased to the 70s, and she had a normal sinus rhythm.  Her pressure remains greater than 100.  Her symptoms resolved.  Labs Reviewed  BASIC METABOLIC PANEL - Abnormal; Notable for the following:    Glucose, Bld 153 (*)    GFR calc non Af Amer  48 (*)    GFR calc Af Amer 55 (*)    All other components within normal limits  CBC - Abnormal; Notable for the following:    WBC 11.0 (*)    All other components within normal limits  DIFFERENTIAL - Abnormal; Notable for the following:    Neutro Abs 8.2 (*)    All other components within normal limits  PROTIME-INR  POCT I-STAT TROPONIN I   No results found.   No diagnosis found.  I spoke with the cardiologist, Dr. Charm Barges.  He agreed with the treatment that I had provided to the patient and agreed with discharge and followup in the office.   MDM  PSVT Controlled with diltiazem.  Symptoms resolved.  No signs of cardiac ischemia        Nicholes Stairs, MD 01/07/12 0120

## 2012-01-21 ENCOUNTER — Ambulatory Visit (INDEPENDENT_AMBULATORY_CARE_PROVIDER_SITE_OTHER): Payer: Medicare Other | Admitting: Internal Medicine

## 2012-01-21 ENCOUNTER — Encounter: Payer: Self-pay | Admitting: Internal Medicine

## 2012-01-21 VITALS — BP 114/62 | HR 56 | Wt 160.0 lb

## 2012-01-21 DIAGNOSIS — I10 Essential (primary) hypertension: Secondary | ICD-10-CM

## 2012-01-21 DIAGNOSIS — I471 Supraventricular tachycardia, unspecified: Secondary | ICD-10-CM

## 2012-01-21 NOTE — Patient Instructions (Signed)
Your physician wants you to follow-up in: 6 months with Dr. Allred. You will receive a reminder letter in the mail two months in advance. If you don't receive a letter, please call our office to schedule the follow-up appointment.  

## 2012-01-24 ENCOUNTER — Encounter: Payer: Self-pay | Admitting: Internal Medicine

## 2012-01-24 NOTE — Assessment & Plan Note (Signed)
Stable No change required today  

## 2012-01-24 NOTE — Assessment & Plan Note (Signed)
She has had documented short RP SVT for which she was in the ER 01/06/12. Therapeutic strategies for supraventricular tachycardia including medicine and ablation were discussed in detail with the patient today. Risk, benefits, and alternatives to EP study and radiofrequency ablation were also discussed in detail today.  At this time, I would favor ablation.  Unfortunately, she remains very clear in her decision to decline ablation.  We will therefore continue her current medical strategy. She will contact my office should she decide to pursue ablation.

## 2012-01-24 NOTE — Progress Notes (Signed)
PCP:  Eartha Inch, MD, MD  The patient presents today for routine cardiology followup.  Since last being seen in our clinic, the patient reports doing reasonably well.  She developed recurrent sustained SVT 01/06/12 for which she presented to the ER.  This is her first episode of SVT since I saw her last.  She received diltiazem per ER report with resolution.  I do not see that adenosine was tried, though her tachycardia has previously been adenosine sensitive. Since the episode, she has done well.   Today, she denies symptoms of  Further palpitations, chest pain, shortness of breath, orthopnea, PND, lower extremity edema, dizziness, presyncope, or syncope.  The patient feels that she is tolerating medications without difficulties and is otherwise without complaint today.   Past Medical History  Diagnosis Date  . Mitral valve prolapse   . Seizure   . SVT (supraventricular tachycardia)     documented short RP tachycardia, previously adenosine sensitive  . GERD (gastroesophageal reflux disease)   . Hyperlipidemia   . Asymmetric septal hypertrophy   . H/O: rheumatic fever    Past Surgical History  Procedure Date  . Abdominal hysterectomy     Current Outpatient Prescriptions  Medication Sig Dispense Refill  . aspirin 325 MG tablet Take 325 mg by mouth daily.      . calcium-vitamin D (OSCAL WITH D) 250-125 MG-UNIT per tablet Take 1 tablet by mouth every 21 ( twenty-one) days.      Marland Kitchen co-enzyme Q-10 30 MG capsule Take 30 mg by mouth daily.      Marland Kitchen levETIRAcetam (KEPPRA) 500 MG tablet Take 500 mg by mouth Twice daily.      . magnesium oxide (MAG-OX) 400 MG tablet Take 400 mg by mouth daily.      . metoprolol (LOPRESSOR) 50 MG tablet Take 1 tablet by mouth daily  14 tablet  0  . simvastatin (ZOCOR) 20 MG tablet Take 20 mg by mouth every evening.        Allergies  Allergen Reactions  . Codeine Other (See Comments)    unknown  . Cortisone Other (See Comments)    Unknown   .  Nitrofurantoin Other (See Comments)    aches  . Sulfonamide Derivatives Other (See Comments)    unknown    History   Social History  . Marital Status: Single    Spouse Name: N/A    Number of Children: N/A  . Years of Education: N/A   Occupational History  . Not on file.   Social History Main Topics  . Smoking status: Never Smoker   . Smokeless tobacco: Not on file  . Alcohol Use: No  . Drug Use: No  . Sexually Active: Not on file   Other Topics Concern  . Not on file   Social History Narrative  . No narrative on file    Physical Exam: Filed Vitals:   01/21/12 1154  BP: 114/62  Pulse: 56  Weight: 160 lb (72.576 kg)    GEN- The patient is well appearing, alert and oriented x 3 today.   Head- normocephalic, atraumatic Eyes-  Sclera clear, conjunctiva pink Ears- hearing intact Oropharynx- clear Neck- supple, no JVP Lymph- no cervical lymphadenopathy Lungs- Clear to ausculation bilaterally, normal work of breathing Heart- Regular rate and rhythm, no murmurs, rubs or gallops, PMI not laterally displaced GI- soft, NT, ND, + BS Extremities- no clubbing, cyanosis, or edema  ekg 01/06/12- short RP SVT  Assessment and Plan:

## 2012-07-20 ENCOUNTER — Ambulatory Visit: Payer: Medicare Other | Admitting: Internal Medicine

## 2012-08-19 ENCOUNTER — Ambulatory Visit: Payer: Medicare Other | Admitting: Internal Medicine

## 2012-08-22 ENCOUNTER — Ambulatory Visit (INDEPENDENT_AMBULATORY_CARE_PROVIDER_SITE_OTHER): Payer: Medicare Other | Admitting: Internal Medicine

## 2012-08-22 VITALS — BP 140/72 | HR 54 | Ht 65.0 in | Wt 162.0 lb

## 2012-08-22 DIAGNOSIS — I471 Supraventricular tachycardia: Secondary | ICD-10-CM

## 2012-08-22 NOTE — Patient Instructions (Signed)
Your physician wants you to follow-up in: 12 months with Dr Allred You will receive a reminder letter in the mail two months in advance. If you don't receive a letter, please call our office to schedule the follow-up appointment.  

## 2012-08-22 NOTE — Progress Notes (Signed)
PCP: Eartha Inch, MD and also Dr Terrill Mohr  Carla Flowers is a 78 y.o. female who presents today for routine electrophysiology followup.  Since last being seen in our clinic, the patient reports doing well. She remains active for her age.  She had an episode of SVT 7/13 for which she required adenosine to terminate. She has had shorter episodes which have all terminated with vagal maneuvers.   Today, she denies symptoms of chest pain, shortness of breath,  lower extremity edema, dizziness, presyncope, or syncope.  The patient is otherwise without complaint today.   Past Medical History  Diagnosis Date  . Mitral valve prolapse   . Seizure   . SVT (supraventricular tachycardia)     documented short RP tachycardia, previously adenosine sensitive  . GERD (gastroesophageal reflux disease)   . Hyperlipidemia   . Asymmetric septal hypertrophy   . H/O: rheumatic fever    Past Surgical History  Procedure Date  . Abdominal hysterectomy     Current Outpatient Prescriptions  Medication Sig Dispense Refill  . aspirin 325 MG tablet Take 325 mg by mouth daily.      . calcium-vitamin D (OSCAL WITH D) 250-125 MG-UNIT per tablet Take 1 tablet by mouth every 14 (fourteen) days.       Marland Kitchen co-enzyme Q-10 30 MG capsule Take 30 mg by mouth daily.      Marland Kitchen levETIRAcetam (KEPPRA) 500 MG tablet Take 500 mg by mouth Twice daily.      . magnesium oxide (MAG-OX) 400 MG tablet Take 400 mg by mouth daily.      . metoprolol (LOPRESSOR) 50 MG tablet Take 12.5 mg by mouth 2 (two) times daily.      . simvastatin (ZOCOR) 20 MG tablet Take 20 mg by mouth every evening.      Marland Kitchen DISCONTD: metoprolol (LOPRESSOR) 50 MG tablet Take 1 tablet by mouth daily  14 tablet  0    Physical Exam: Filed Vitals:   08/22/12 1042  BP: 140/72  Pulse: 54  Height: 5\' 5"  (1.651 m)  Weight: 162 lb (73.483 kg)  SpO2: 95%    GEN- The patient is well appearing, alert and oriented x 3 today.   Head- normocephalic, atraumatic Eyes-   Sclera clear, conjunctiva pink Ears- hearing intact Oropharynx- clear Lungs- Clear to ausculation bilaterally, normal work of breathing Heart- Regular rate and rhythm, no murmurs, rubs or gallops, PMI not laterally displaced GI- soft, NT, ND, + BS Extremities- no clubbing, cyanosis, or edema  ekg today reveals sinus bradycardia 54 bpm, incomplete RBBB, lateral T wave changes  Assessment and Plan:

## 2012-08-22 NOTE — Assessment & Plan Note (Signed)
She has had documented short RP SVT for which she has required adenosine previously. Therapeutic strategies for supraventricular tachycardia including medicine and ablation were discussed in detail with the patient today. Risk, benefits, and alternatives to EP study and radiofrequency ablation were also discussed in detail today.  At this time, I would favor ablation.  Unfortunately, she remains very clear in her decision to decline ablation.  We will therefore continue her current medical strategy.  She will contact my office should she decide to pursue ablation.  Return in 1year

## 2013-05-01 ENCOUNTER — Ambulatory Visit (INDEPENDENT_AMBULATORY_CARE_PROVIDER_SITE_OTHER): Payer: Medicare Other | Admitting: Internal Medicine

## 2013-05-01 ENCOUNTER — Encounter: Payer: Self-pay | Admitting: Internal Medicine

## 2013-05-01 VITALS — BP 128/62 | HR 50 | Ht 65.5 in | Wt 165.6 lb

## 2013-05-01 DIAGNOSIS — I495 Sick sinus syndrome: Secondary | ICD-10-CM

## 2013-05-01 DIAGNOSIS — I471 Supraventricular tachycardia: Secondary | ICD-10-CM

## 2013-05-01 DIAGNOSIS — E785 Hyperlipidemia, unspecified: Secondary | ICD-10-CM

## 2013-05-01 MED ORDER — METOPROLOL TARTRATE 25 MG PO TABS: ORAL_TABLET | ORAL | Status: DC

## 2013-05-01 NOTE — Patient Instructions (Addendum)
Your physician recommends that you schedule a follow-up appointment in 3 months with Dr Allred    

## 2013-05-07 DIAGNOSIS — E785 Hyperlipidemia, unspecified: Secondary | ICD-10-CM | POA: Insufficient documentation

## 2013-05-07 DIAGNOSIS — I495 Sick sinus syndrome: Secondary | ICD-10-CM | POA: Insufficient documentation

## 2013-05-07 NOTE — Progress Notes (Signed)
PCP: Eartha Inch, MD and also Dr Terrill Mohr  Carla Flowers is a 79 y.o. female who presents today for routine electrophysiology followup.  Since last being seen in our clinic, the patient reports doing reasonably well. She remains active for her age.  She had an episode of SVT 5/14 for which she required adenosine to terminate.  Medical therapy with toprol has caused occasional bradycardia.  She has since reduced her toprol and feels that her bradycardic symptoms are limited.   Today, she denies symptoms of chest pain, shortness of breath,  lower extremity edema, dizziness, presyncope, or syncope.  The patient is otherwise without complaint today.   Past Medical History  Diagnosis Date  . Mitral valve prolapse   . Seizure   . SVT (supraventricular tachycardia)     documented short RP tachycardia, previously adenosine sensitive  . GERD (gastroesophageal reflux disease)   . Hyperlipidemia   . Asymmetric septal hypertrophy   . H/O: rheumatic fever    Past Surgical History  Procedure Laterality Date  . Abdominal hysterectomy      Current Outpatient Prescriptions  Medication Sig Dispense Refill  . aspirin 325 MG tablet Take 325 mg by mouth daily.      . calcium-vitamin D (OSCAL WITH D) 250-125 MG-UNIT per tablet Take 1 tablet by mouth every 14 (fourteen) days.       Marland Kitchen co-enzyme Q-10 30 MG capsule Take 30 mg by mouth daily.      Marland Kitchen levETIRAcetam (KEPPRA) 500 MG tablet Take 500 mg by mouth Twice daily.      . magnesium oxide (MAG-OX) 400 MG tablet Take 400 mg by mouth daily.      . metoprolol (LOPRESSOR) 25 MG tablet Take 25mg  in the am and 12.5mg  in the pm      . simvastatin (ZOCOR) 20 MG tablet Take 20 mg by mouth every evening.       No current facility-administered medications for this visit.    Physical Exam: Filed Vitals:   05/01/13 1420  BP: 128/62  Pulse: 50  Height: 5' 5.5" (1.664 m)  Weight: 165 lb 9.6 oz (75.116 kg)    GEN- The patient is well appearing, alert  and oriented x 3 today.   Head- normocephalic, atraumatic Eyes-  Sclera clear, conjunctiva pink Ears- hearing intact Oropharynx- clear Lungs- Clear to ausculation bilaterally, normal work of breathing Heart- Regular rate and rhythm, no murmurs, rubs or gallops, PMI not laterally displaced GI- soft, NT, ND, + BS Extremities- no clubbing, cyanosis, or edema  ekg today reveals sinus bradycardia 50 bpm, incomplete RBBB,LVH  lateral T wave changes (similar to prior ekg)  Assessment and Plan:  1. SVT She continues to have episodes of adenosine sensitive SVT. Therapeutic strategies for supraventricular tachycardia including medicine and ablation were discussed in detail with the patient today. Risk, benefits, and alternatives to EP study and radiofrequency ablation were also discussed in detail today. She remains clear that she would like to avoid procedures at this time.  2. Tachy/brady She has SVT for which she takes metoprolol.  On metoprolol, she has had bradycardia.  The appropriate treatment coarse for her would be to proceed with ablation for SVT with subsequent stopping metoprolol.  Off of beta blockers, she has no symptoms of bradycardia and her bradycardia improves.  It would be inappropriate I believe to pursue pacemaker implantation at this time as this would be a more high risk procedure than ablation (which would provide definitive treatment for her  recurrent SVT).  I have again recommended ablation.  She is not ready to proceed.  She will continue her present watchful waiting strategy and will contact me if she decides to proceed with ablation.  3. HL Stable No change required today

## 2013-06-15 ENCOUNTER — Encounter: Payer: Self-pay | Admitting: Internal Medicine

## 2013-08-24 ENCOUNTER — Ambulatory Visit: Payer: Medicare Other | Admitting: Internal Medicine

## 2013-08-28 ENCOUNTER — Ambulatory Visit: Payer: Medicare Other | Admitting: Internal Medicine

## 2013-10-02 ENCOUNTER — Ambulatory Visit (INDEPENDENT_AMBULATORY_CARE_PROVIDER_SITE_OTHER): Payer: Medicare Other | Admitting: Internal Medicine

## 2013-10-02 ENCOUNTER — Encounter: Payer: Self-pay | Admitting: Internal Medicine

## 2013-10-02 ENCOUNTER — Encounter (INDEPENDENT_AMBULATORY_CARE_PROVIDER_SITE_OTHER): Payer: Self-pay

## 2013-10-02 VITALS — BP 142/72 | HR 58 | Ht 65.5 in | Wt 172.0 lb

## 2013-10-02 DIAGNOSIS — I10 Essential (primary) hypertension: Secondary | ICD-10-CM

## 2013-10-02 DIAGNOSIS — R079 Chest pain, unspecified: Secondary | ICD-10-CM

## 2013-10-02 DIAGNOSIS — R0602 Shortness of breath: Secondary | ICD-10-CM | POA: Insufficient documentation

## 2013-10-02 DIAGNOSIS — E785 Hyperlipidemia, unspecified: Secondary | ICD-10-CM

## 2013-10-02 DIAGNOSIS — I471 Supraventricular tachycardia: Secondary | ICD-10-CM

## 2013-10-02 NOTE — Patient Instructions (Signed)
Your physician wants you to follow-up in: 6 months Norma Fredrickson.NP and 12 months with Dr Jacquiline Doe will receive a reminder letter in the mail two months in advance. If you don't receive a letter, please call our office to schedule the follow-up appointment.  Your physician has requested that you have an echocardiogram. Echocardiography is a painless test that uses sound waves to create images of your heart. It provides your doctor with information about the size and shape of your heart and how well your heart's chambers and valves are working. This procedure takes approximately one hour. There are no restrictions for this procedure.  Your physician has requested that you have a lexiscan myoview. For further information please visit [email or web address] Please follow instruction sheet, as given.

## 2013-10-02 NOTE — Progress Notes (Signed)
PCP: Eartha Inch, MD and also Dr Terrill Mohr  [name] is a 79 y.o. female who presents today for routine electrophysiology followup.  Since last being seen in our clinic, the patient reports doing reasonably well. She remains active for her age.  She has had no further SVT.  Her concern today is with SOB with exertion.  Though this is chronic, she feels that this has worsened recently.  She also reports chest discomfort.  This occurs mostly at night and is occasionally improved with belching. Today, she denies symptoms of  lower extremity edema, dizziness, presyncope, or syncope.  The patient is otherwise without complaint today.   Past Medical History  Diagnosis Date  . Mitral valve prolapse   . Seizure   . SVT (supraventricular tachycardia)     documented short RP tachycardia, previously adenosine sensitive  . GERD (gastroesophageal reflux disease)   . Hyperlipidemia   . Asymmetric septal hypertrophy   . H/O: rheumatic fever    Past Surgical History  Procedure Laterality Date  . Abdominal hysterectomy      Current Outpatient Prescriptions  Medication Sig Dispense Refill  . aspirin 325 MG tablet Take 325 mg by mouth daily.      . calcium-vitamin D (OSCAL WITH D) 250-125 MG-UNIT per tablet Take 1 tablet by mouth every 14 (fourteen) days.       Marland Kitchen co-enzyme Q-10 30 MG capsule Take 30 mg by mouth daily.      . cyanocobalamin 100 MCG tablet Take 100 mcg by mouth daily.      Marland Kitchen levETIRAcetam (KEPPRA) 500 MG tablet Take 500 mg by mouth Twice daily.      Marland Kitchen LORazepam (ATIVAN) 0.5 MG tablet Take 0.5 mg by mouth as needed for seizure.      . magnesium oxide (MAG-OX) 400 MG tablet Take 400 mg by mouth daily.      . metoprolol (LOPRESSOR) 25 MG tablet Take 25mg  in the am and 12.5mg  in the pm      . simvastatin (ZOCOR) 20 MG tablet Take 20 mg by mouth every evening.       No current facility-administered medications for this visit.    Physical Exam: Filed Vitals:   10/02/13 1002   BP: 142/72  Pulse: 58  Height: 5' 5.5" (1.664 m)  Weight: 172 lb (78.019 kg)    GEN- The patient is well appearing, alert and oriented x 3 today.   Head- normocephalic, atraumatic Eyes-  Sclera clear, conjunctiva pink Ears- hearing intact Oropharynx- clear Lungs- Clear to ausculation bilaterally, normal work of breathing Heart- Regular rate and rhythm, no murmurs, rubs or gallops, PMI not laterally displaced GI- soft, NT, ND, + BS Extremities- no clubbing, cyanosis, or edema  ekg today reveals sinus bradycardia 58 bpm, incomplete RBBB,LVH  lateral T wave changes (similar to prior ekg)  Assessment and Plan:  1. SVT Well controlled She wishes to avoid ablation  2. SOB/ chest discomfort Further CV risk stratification is recommended I will obtain an echo to evaluate her SOB In addition, I will obtain a lexiscan myoview to evaluate for ischemia.  Her baseline ekg is abnormal.  3. HL Stable No change required today   Return to see Norma Fredrickson in 6 months (unless above tests are abnormal) I will see again in 1 year

## 2013-10-29 ENCOUNTER — Emergency Department (HOSPITAL_COMMUNITY)
Admission: EM | Admit: 2013-10-29 | Discharge: 2013-10-29 | Disposition: A | Discharge status: Home / Self Care | Payer: Medicare Other | Attending: Emergency Medicine | Admitting: Emergency Medicine

## 2013-10-29 ENCOUNTER — Emergency Department (HOSPITAL_COMMUNITY): Payer: Medicare Other

## 2013-10-29 ENCOUNTER — Encounter (HOSPITAL_COMMUNITY): Payer: Self-pay | Admitting: Emergency Medicine

## 2013-10-29 DIAGNOSIS — Z8619 Personal history of other infectious and parasitic diseases: Secondary | ICD-10-CM | POA: Insufficient documentation

## 2013-10-29 DIAGNOSIS — I471 Supraventricular tachycardia, unspecified: Secondary | ICD-10-CM | POA: Insufficient documentation

## 2013-10-29 DIAGNOSIS — R110 Nausea: Secondary | ICD-10-CM | POA: Insufficient documentation

## 2013-10-29 DIAGNOSIS — Z888 Allergy status to other drugs, medicaments and biological substances status: Secondary | ICD-10-CM | POA: Insufficient documentation

## 2013-10-29 DIAGNOSIS — I059 Rheumatic mitral valve disease, unspecified: Secondary | ICD-10-CM | POA: Insufficient documentation

## 2013-10-29 DIAGNOSIS — Z8679 Personal history of other diseases of the circulatory system: Secondary | ICD-10-CM | POA: Insufficient documentation

## 2013-10-29 DIAGNOSIS — K219 Gastro-esophageal reflux disease without esophagitis: Secondary | ICD-10-CM | POA: Insufficient documentation

## 2013-10-29 DIAGNOSIS — R079 Chest pain, unspecified: Secondary | ICD-10-CM | POA: Insufficient documentation

## 2013-10-29 DIAGNOSIS — I422 Other hypertrophic cardiomyopathy: Secondary | ICD-10-CM | POA: Insufficient documentation

## 2013-10-29 DIAGNOSIS — R609 Edema, unspecified: Secondary | ICD-10-CM | POA: Insufficient documentation

## 2013-10-29 DIAGNOSIS — E785 Hyperlipidemia, unspecified: Secondary | ICD-10-CM | POA: Insufficient documentation

## 2013-10-29 DIAGNOSIS — I4510 Unspecified right bundle-branch block: Secondary | ICD-10-CM | POA: Insufficient documentation

## 2013-10-29 DIAGNOSIS — R569 Unspecified convulsions: Secondary | ICD-10-CM | POA: Insufficient documentation

## 2013-10-29 DIAGNOSIS — Z79899 Other long term (current) drug therapy: Secondary | ICD-10-CM | POA: Insufficient documentation

## 2013-10-29 DIAGNOSIS — Z882 Allergy status to sulfonamides status: Secondary | ICD-10-CM | POA: Insufficient documentation

## 2013-10-29 DIAGNOSIS — Z7982 Long term (current) use of aspirin: Secondary | ICD-10-CM | POA: Insufficient documentation

## 2013-10-29 DIAGNOSIS — Z885 Allergy status to narcotic agent status: Secondary | ICD-10-CM | POA: Insufficient documentation

## 2013-10-29 LAB — BASIC METABOLIC PANEL
CO2: 25 mEq/L (ref 19–32)
Creatinine, Ser: 0.88 mg/dL (ref 0.50–1.10)
GFR calc Af Amer: 71 mL/min — ABNORMAL LOW (ref >90)
GFR calc non Af Amer: 61 mL/min — ABNORMAL LOW (ref >90)
Glucose, Bld: 163 mg/dL — ABNORMAL HIGH (ref 70–99)
Potassium: 3.6 mEq/L (ref 3.5–5.1)
Sodium: 138 mEq/L (ref 135–145)

## 2013-10-29 LAB — CBC
Hemoglobin: 14.5 g/dL (ref 12.0–15.0)
MCHC: 33.1 g/dL (ref 30.0–36.0)
Platelets: 223 10*3/uL (ref 150–400)
RBC: 4.65 MIL/uL (ref 3.87–5.11)

## 2013-10-29 LAB — POCT I-STAT TROPONIN I: Troponin i, poc: 0.01 ng/mL (ref 0.00–0.08)

## 2013-10-29 MED ORDER — ADENOSINE 6 MG/2ML IV SOLN
INTRAVENOUS | Status: AC
  Administered 2013-10-29: 12 mg
  Filled 2013-10-29: qty 4

## 2013-10-29 MED ORDER — ADENOSINE 6 MG/2ML IV SOLN
INTRAVENOUS | Status: AC
  Administered 2013-10-29: 6 mg
  Filled 2013-10-29: qty 2

## 2013-10-29 NOTE — ED Notes (Signed)
Pts HR noted to be in the 170s and in SVT. EDP made aware. Zoll pads applied and 2 liters of O2 given via nasal cannula. Pt pale but alert and oriented. Pt beared down as though to have a BM per request. Pt still in SVT in the 170s. EDP verbally ordered 6 mg of Adenocard. 6 mg given rapid IV push with 10 cc flush immediately after. Pt noted to still be in SVT still at a rate of 160s-170s. EDP verbally ordered 12 mg of Adenocard. 12 mg given rapid IV push with saline flush immediately after. Pts then converted to NSR at a rate of 70s. EDP at bedside throughout and airway maintained. VSS at this time. Skin color now normal color and pt reports she "feels better."

## 2013-10-29 NOTE — ED Provider Notes (Signed)
CSN: [id number]     Arrival date & time 10/29/13  1549 History   First MD Initiated Contact with Patient 10/29/13 1609     Chief Complaint  Patient presents with  . Palpitations   (Consider location/radiation/quality/duration/timing/severity/associated sxs/prior Treatment) Patient is a 79 y.o. female presenting with palpitations. The history is provided by the patient.  Palpitations Palpitations quality:  Regular Onset quality:  Sudden Duration:  1 hour Timing:  Intermittent Progression:  Unchanged Chronicity:  Recurrent Context comment:  While standing in her kitchen Relieved by:  Nothing Worsened by:  Nothing tried Ineffective treatments: pt took an extra half table of her 25mg  lopressor. Associated symptoms: chest pain and nausea   Associated symptoms: no back pain, no cough, no dizziness, no shortness of breath and no vomiting     Past Medical History  Diagnosis Date  . Mitral valve prolapse   . Seizure   . SVT (supraventricular tachycardia)     documented short RP tachycardia, previously adenosine sensitive  . GERD (gastroesophageal reflux disease)   . Hyperlipidemia   . Asymmetric septal hypertrophy   . H/O: rheumatic fever    Past Surgical History  Procedure Laterality Date  . Abdominal hysterectomy     History reviewed. No pertinent family history. History  Substance Use Topics  . Smoking status: Never Smoker   . Smokeless tobacco: Not on file  . Alcohol Use: No   OB History   Grav Para Term Preterm Abortions TAB SAB Ect Mult Living                 Review of Systems  Constitutional: Negative for fever and fatigue.  HENT: Negative for congestion and drooling.   Eyes: Negative for pain.  Respiratory: Negative for cough and shortness of breath.   Cardiovascular: Positive for chest pain and palpitations.  Gastrointestinal: Positive for nausea. Negative for vomiting, abdominal pain and diarrhea.  Genitourinary: Negative for dysuria and hematuria.   Musculoskeletal: Negative for back pain, gait problem and neck pain.  Skin: Negative for color change.  Neurological: Negative for dizziness and headaches.  Hematological: Negative for adenopathy.  Psychiatric/Behavioral: Negative for behavioral problems.  All other systems reviewed and are negative.    Allergies  Codeine; Cortisone; Nitrofurantoin; and Sulfonamide derivatives  Home Medications   Current Outpatient Rx  Name  Route  Sig  Dispense  Refill  . aspirin 325 MG tablet   Oral   Take 325 mg by mouth daily.         . calcium-vitamin D (OSCAL WITH D) 250-125 MG-UNIT per tablet   Oral   Take 1 tablet by mouth every 14 (fourteen) days.          Marland Kitchen co-enzyme Q-10 30 MG capsule   Oral   Take 30 mg by mouth daily.         . cyanocobalamin 100 MCG tablet   Oral   Take 100 mcg by mouth daily.         Marland Kitchen levETIRAcetam (KEPPRA) 500 MG tablet   Oral   Take 500 mg by mouth Twice daily.         Marland Kitchen LORazepam (ATIVAN) 0.5 MG tablet   Oral   Take 0.5 mg by mouth as needed for seizure.         . magnesium oxide (MAG-OX) 400 MG tablet   Oral   Take 400 mg by mouth daily.         . metoprolol (LOPRESSOR) 25 MG tablet  Take 25mg  in the am and 12.5mg  in the pm         . simvastatin (ZOCOR) 20 MG tablet   Oral   Take 20 mg by mouth every evening.          BP 141/76  Pulse 169  Temp(Src) 97.8 F (36.6 C) (Oral)  Resp 19  SpO2 86% Physical Exam  Nursing note and vitals reviewed. Constitutional: She is oriented to person, place, and time. She appears well-developed and well-nourished.  Pale appearing  HENT:  Head: Normocephalic.  Mouth/Throat: Oropharynx is clear and moist. No oropharyngeal exudate.  Eyes: Conjunctivae and EOM are normal. Pupils are equal, round, and reactive to light.  Neck: Normal range of motion. Neck supple.  Cardiovascular: Regular rhythm, normal heart sounds and intact distal pulses.  Exam reveals no gallop and no friction  rub.   No murmur heard. HR 170, SVT  Pulmonary/Chest: Effort normal and breath sounds normal. No respiratory distress. She has no wheezes.  Abdominal: Soft. Bowel sounds are normal. There is no tenderness. There is no rebound and no guarding.  Musculoskeletal: Normal range of motion. She exhibits edema (trace pitting edema in bilateral lower distal extremities.). She exhibits no tenderness.  Neurological: She is alert and oriented to person, place, and time.  Skin: Skin is warm and dry.  Psychiatric: She has a normal mood and affect. Her behavior is normal.    ED Course  Procedures (including critical care time) Labs Review Labs Reviewed  BASIC METABOLIC PANEL - Abnormal; Notable for the following:    Glucose, Bld 163 (*)    GFR calc non Af Amer 61 (*)    GFR calc Af Amer 71 (*)    All other components within normal limits  CBC  POCT I-STAT TROPONIN I   Imaging Review Dg Chest 2 View  10/29/2013   CLINICAL DATA:  Cardiac palpitations  EXAM: CHEST  2 VIEW  COMPARISON:  Report of prior chest radiograph Mar 29, 2002 available; images from that study not available.  FINDINGS: There is underlying emphysematous change. There is no edema or consolidation. Heart is borderline prominent with normal pulmonary vascularity. No adenopathy. There is atherosclerotic change in the aorta. No bone lesions.  IMPRESSION: No edema or consolidation.  Underlying emphysematous change.   Electronically Signed   By: Bretta Bang M.D.   On: 10/29/2013 18:39    EKG Interpretation    Date/Time:  Sunday October 29 2013 15:52:57 EST Ventricular Rate:  71 PR Interval:  176 QRS Duration: 102 QT Interval:  420 QTC Calculation: 456 R Axis:   -16 Text Interpretation:  Normal sinus rhythm Incomplete right bundle branch block Left ventricular hypertrophy with repolarization abnormality No significant change since last tracing Confirmed by Glendola Friedhoff  MD, [place] ([id number]) on 10/29/2013 4:02:34 PM            CRITICAL CARE Performed by: Purvis Sheffield, S Total critical care time: 30 min Critical care time was exclusive of separately billable procedures and treating other patients. Critical care was necessary to treat or prevent imminent or life-threatening deterioration. Critical care was time spent personally by me on the following activities: development of treatment plan with patient and/or surrogate as well as nursing, discussions with consultants, evaluation of patient's response to treatment, examination of patient, obtaining history from patient or surrogate, ordering and performing treatments and interventions, ordering and review of laboratory studies, ordering and review of radiographic studies, pulse oximetry and re-evaluation of patient's condition.  MDM  1. SVT (supraventricular tachycardia)    4:25 PM 79 y.o. female with a history of SVT presents with palpitations. She states that at approximately 2:30 PM she was standing in her kitchen when she had sudden onset palpitations. She also had a dull chest ache which began with palpitations that radiated to her left arm. She states that her symptoms lasted approximately one hour and spontaneously abated when she got to the hospital. Her initial EKG showed a normal sinus rhythm. While waiting to be seen she states that she became flushed and began having palpitations again. Her EKG now shows SVT with a heart rate of 170. An initial 6 mg of adenosine was given without any resolution. She was then given 12 mg of adenosine with resolution of her SVT. She currently denies any chest pain but feels nauseous. Will get screening labwork.    7:40 PM: Pt remains asx and has been monitored here for 2-3 hours. I discussed case w/ on call cards ([place]). Family comfortable going home, pt remains asx. Will rec close f/u w/ her cardiologist Dr. Johney Frame.  I have discussed the diagnosis/risks/treatment options with the patient and family and believe the pt  to be eligible for discharge home to follow-up with her cardiologist within the next 1-2 days. We also discussed returning to the ED immediately if new or worsening sx occur. We discussed the sx which are most concerning (e.g., return of palp's, cp, sob, dizziness, syncope) that necessitate immediate return. Any new prescriptions provided to the patient are listed below.   Junius Argyle, MD 10/30/13 1049

## 2013-10-29 NOTE — ED Notes (Signed)
Pt c/o palpitations today with hx of SVT; pt sts took extra toprol today after started; pt sts CP during even but denies at present; right after triage pt had possible syncopal episode with some twitching per EMT; pt to [facility] for eval

## 2013-12-07 ENCOUNTER — Telehealth: Payer: Self-pay | Admitting: Internal Medicine

## 2013-12-07 NOTE — Telephone Encounter (Signed)
New message    Have appt on Monday---have questions and want to talk to a nurse

## 2013-12-07 NOTE — Telephone Encounter (Signed)
Spoke with patient, she is concerned about having the chemical stress test as she has read on the Internet that it can be "deadly and cause a heat attack"  She was under the impression that she was having both the echo and the stress test the same day.  I let her know the echo is scheduled for Monday at 8:30 and the stress test Wed at 7:15  She can discuss her concerns with them on Mon and out what she wants to do.  She will decide what she wants to do and will prob not have the stress test but proceed with echo

## 2013-12-11 ENCOUNTER — Ambulatory Visit (HOSPITAL_COMMUNITY): Payer: Medicare Other | Attending: Cardiology | Admitting: Radiology

## 2013-12-11 ENCOUNTER — Encounter: Payer: Self-pay | Admitting: Cardiology

## 2013-12-11 DIAGNOSIS — R0602 Shortness of breath: Secondary | ICD-10-CM

## 2013-12-11 DIAGNOSIS — I079 Rheumatic tricuspid valve disease, unspecified: Secondary | ICD-10-CM | POA: Insufficient documentation

## 2013-12-11 DIAGNOSIS — I379 Nonrheumatic pulmonary valve disorder, unspecified: Secondary | ICD-10-CM | POA: Insufficient documentation

## 2013-12-11 DIAGNOSIS — I059 Rheumatic mitral valve disease, unspecified: Secondary | ICD-10-CM | POA: Insufficient documentation

## 2013-12-11 DIAGNOSIS — I10 Essential (primary) hypertension: Secondary | ICD-10-CM

## 2013-12-11 DIAGNOSIS — I359 Nonrheumatic aortic valve disorder, unspecified: Secondary | ICD-10-CM | POA: Insufficient documentation

## 2013-12-11 NOTE — Progress Notes (Signed)
Echocardiogram performed.  

## 2013-12-12 ENCOUNTER — Encounter: Payer: Self-pay | Admitting: Cardiology

## 2013-12-13 ENCOUNTER — Encounter (HOSPITAL_COMMUNITY): Payer: Medicare Other

## 2014-03-07 ENCOUNTER — Telehealth: Payer: Self-pay | Admitting: Internal Medicine

## 2014-03-07 NOTE — Telephone Encounter (Signed)
Left message for patient to return my call.

## 2014-03-07 NOTE — Telephone Encounter (Signed)
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  Having surgery soon. Talk to a nurse

## 2014-03-07 NOTE — Telephone Encounter (Signed)
Spoke with patient and she is having a basal cell taken off her nose.  Wants to make sure this is okay.  This is to be done on 03/13/14 in the office.  Will use Novocain to numb area and remove.  Just worried due to her history of SVT.   Please advise if okay

## 2014-03-07 NOTE — Telephone Encounter (Signed)
Patient is returning your call. Please call back.  °

## 2014-03-11 NOTE — Telephone Encounter (Signed)
OK to remove BCC.  Would avoid epinephrine.

## 2014-03-13 NOTE — Telephone Encounter (Signed)
Had it done today.  She is doing okay.

## 2014-04-26 ENCOUNTER — Telehealth: Payer: Self-pay | Admitting: Internal Medicine

## 2014-04-26 NOTE — Telephone Encounter (Signed)
New Message  Pt called states that she had an SVT episode last night// [place]. She says they gave her medicine to slow her heart rate. Pt requests a call back to discuss further with the nurse. Unable to make an appt because she will be there for 5-6 more months. Please call

## 2014-04-27 NOTE — Telephone Encounter (Signed)
Spoke with patient and [place].  She will call to make an appointment for Oct in Aug so she can be sure to get in.  She will go to [place] if continues to have problems

## 2014-07-04 DIAGNOSIS — R778 Other specified abnormalities of plasma proteins: Secondary | ICD-10-CM | POA: Insufficient documentation

## 2014-07-04 DIAGNOSIS — R7989 Other specified abnormal findings of blood chemistry: Secondary | ICD-10-CM

## 2014-07-30 ENCOUNTER — Encounter: Payer: Self-pay | Admitting: Cardiology

## 2014-07-30 ENCOUNTER — Ambulatory Visit (INDEPENDENT_AMBULATORY_CARE_PROVIDER_SITE_OTHER): Payer: Medicare Other | Admitting: Cardiology

## 2014-07-30 VITALS — BP 110/60 | HR 60 | Ht 60.5 in | Wt 161.0 lb

## 2014-07-30 DIAGNOSIS — I10 Essential (primary) hypertension: Secondary | ICD-10-CM

## 2014-07-30 MED ORDER — DILTIAZEM HCL ER COATED BEADS 120 MG PO CP24: 120.0000 mg | ORAL_CAPSULE | Freq: Every day | ORAL | Status: DC

## 2014-07-30 NOTE — Progress Notes (Signed)
Patient ID: Carla Flowers, female   DOB: 05/17/1932, 80 y.o.   MRN: [id number]    07/30/2014 Carla Flowers   04-24-1932  [id number]  Primary Physician: [name], MD Primary Cardiologist: Dr. [name]  HPI:  The patient is an 80 year old female followed by Dr. [name] in [place] clinic for SVT. She reports to clinic today for followup.  She was last seen by Dr. [name] 10/02/2013. At that time she had been doing fairly well and had declined ablation therapies.  Since that time, she has had 3 episodes of recurrent SVT. The first was 10/29/2013 which required a trip to the [facility] Emergency department. Rate control was achieved with adenosine and she was discharged home the same day. Her second episode occurred in June of this year while staying at her summer home in [place], [place]. She was evaluated in the emergency department there and was sent home the same day after rate control was achieved with adenosine. The third episode was earlier this month and also occurred in [place]. However this episode resulted in a 2 day hospitalization. She reports that she was admitted to the ICU. Per records, they were also concerned for tachy-brady syndrome as she had reported bradycardia as well as some hypotension following administration of adenosine. It was also noted in her records that she had EKG abnormalities with ST/T-wave changes. During her hospitalization cardiac enzymes were cycled and were negative. Due to issues with bradycardia, her metoprolol was also decreased. Prior to admission, she was on 25 mg of Lopressor in the a.m. and 12.5 mg in the p.m. However at time of discharge, she was instructed to reduce her morning dose to 12.5 mg and to hold if her heart rate was less than 60 beats per minute.  She presents back to clinic today for followup. She reports she has been doing fairly well since being discharged from [facility]. She denies any recurrent episodes of SVT since that time, however  she has experienced intermittent palpitations. No chest pain, dizziness, lightheadedness, syncope/near-syncope. She reports full medication compliance, although she states that she has had to hold her blocker on several occasions due to mild bradycardia.  After further discussion, she is still a bit reluctant regarding undergoing SVT ablation as she has concerns regarding being sedated with anesthesia.    Current Outpatient Prescriptions  Medication Sig Dispense Refill  . aspirin 325 MG tablet Take 325 mg by mouth daily.      Marland Kitchen co-enzyme Q-10 30 MG capsule Take 30 mg by mouth daily. Hold while in hospital      . levETIRAcetam (KEPPRA) 500 MG tablet Take 500 mg by mouth Twice daily.      . magnesium oxide (MAG-OX) 400 MG tablet Take 400 mg by mouth daily.      . metoprolol (LOPRESSOR) 25 MG tablet Take 3m in the am and 12.528min the pm      . simvastatin (ZOCOR) 20 MG tablet Take 20 mg by mouth every evening.      . vitamin B-12 (CYANOCOBALAMIN) 500 MCG tablet Take 500 mcg by mouth daily.      . Vitamin D, Ergocalciferol, (DRISDOL) 50000 UNITS CAPS capsule Take 1 tablet by mouth.       No current facility-administered medications for this visit.    Allergies  Allergen Reactions  . Codeine Other (See Comments)    unknown  . Cortisone Other (See Comments) and Hives    Childhood reaction Unknown   . Nitrofurantoin Other (  See Comments)    'achy all over', chills aches  . Sulfa Antibiotics     'couldn't breathe good'  . Sulfonamide Derivatives Other (See Comments)    unknown    History   Social History  . Marital Status: Single    Spouse Name: N/A    Number of Children: N/A  . Years of Education: N/A   Occupational History  . Not on file.   Social History Main Topics  . Smoking status: Never Smoker   . Smokeless tobacco: Not on file  . Alcohol Use: No  . Drug Use: No  . Sexual Activity: Not on file   Other Topics Concern  . Not on file   Social History Narrative    . No narrative on file     Review of Systems: General: negative for chills, fever, night sweats or weight changes.  Cardiovascular: negative for chest pain, dyspnea on exertion, edema, orthopnea, palpitations, paroxysmal nocturnal dyspnea or shortness of breath Dermatological: negative for rash Respiratory: negative for cough or wheezing Urologic: negative for hematuria Abdominal: negative for nausea, vomiting, diarrhea, bright red blood per rectum, melena, or hematemesis Neurologic: negative for visual changes, syncope, or dizziness All other systems reviewed and are otherwise negative except as noted above.    Height 5' 0.5" (1.537 m), weight 161 lb (73.029 kg).  General appearance: alert, cooperative and no distress Neck: no carotid bruit and no JVD Lungs: clear to auscultation bilaterally Heart: regular rate and rhythm Extremities: no LEE Pulses: 2+ and symmetric Skin: warm and dry Neurologic: Grossly normal  EKG Sinus rhythm with PACs. Ventricular rate 60 beats per minute.  ASSESSMENT AND PLAN:   1. Recurrent SVT: Patient has had 3 episodes in the past year, the last of which required hospitalization at an outside hospital. I discussed case with Dr. [name]. He also met with the patient today to discuss treatment goals going forward. Since she remains hesitant regarding ablation therapies, he has recommended that we continue with medical therapy, however we will change her rate control agent from metoprolol to Cardizem. We will start her at 120 mg of Cardizem daily. He has recommended that she followup with an extender in 4 weeks for reassessment and followup with him in EP clinic in 3 months.   Sylis Ketchum, BRITTAINYPA-C 07/30/2014 11:30 AM

## 2014-07-30 NOTE — Patient Instructions (Signed)
Your physician has recommended you make the following change in your medication: 1. Stop Metoprolol 2. Start Cardizem 120 MG 1 tablet daily  Your physician recommends that you schedule a follow-up appointment in: 4 weeks with a NP or PA  Your physician recommends that you schedule a follow-up appointment in: 3 Months with Dr [name]

## 2014-08-31 ENCOUNTER — Encounter: Payer: Self-pay | Admitting: Physician Assistant

## 2014-08-31 ENCOUNTER — Ambulatory Visit (INDEPENDENT_AMBULATORY_CARE_PROVIDER_SITE_OTHER): Payer: Medicare Other | Admitting: Physician Assistant

## 2014-08-31 VITALS — BP 152/78 | HR 59 | Ht 60.5 in | Wt 163.0 lb

## 2014-08-31 DIAGNOSIS — I10 Essential (primary) hypertension: Secondary | ICD-10-CM

## 2014-08-31 DIAGNOSIS — I471 Supraventricular tachycardia: Secondary | ICD-10-CM

## 2014-08-31 DIAGNOSIS — E785 Hyperlipidemia, unspecified: Secondary | ICD-10-CM

## 2014-08-31 MED ORDER — SIMVASTATIN 20 MG PO TABS: 10.0000 mg | ORAL_TABLET | Freq: Every day | ORAL | Status: DC

## 2014-08-31 NOTE — Progress Notes (Signed)
Cardiology Office Note   Date:  08/31/2014   ID:  Carla, Flowers Mar 22, 1932, MRN [id number]  PCP:  [name], MD  Cardiologist/Electrophysiologist:  Dr. Thompson Grayer     History of Present Illness: Carla Flowers is a 80 y.o. female with a history of recurrent SVT. She's been hospitalized several times over the last 10 months. She required adenosine last December in the [facility] emergency room. She had another episode in June 2015 in [place], [place] where she received adenosine. She had another episode in September 2015 also in [place], [place]. She was admitted for 2 days. Cardiac enzymes were normal. Beta blocker was reduced because of bradycardia. Last seen here 07/30/14. Patient was hesitant to proceed with RFCA. Metoprolol was DC'd. Diltiazem 120 mg daily was started. She returns for follow-up.  The patient developed palpitations described as a skipping sensation shortly after starting on diltiazem. She was told that there was an interaction with diltiazem and simvastatin. She stopped taking simvastatin and her skipping sensation immediately stopped. She denies myalgias. She denies any rapid palpitations. She denies chest pain, significant dyspnea, syncope, edema. She sleeps on 2 pillows secondary to acid reflux.  Studies:  - Echo (8/15):  EF 93-81%, grade 4 diastolic dysfunction, mild LAE, mild MR, mild to moderate TR, RVSP 43 mmHg (mild pulmo HTN), mild aortic sclerosis, mild AI, mild asymmetric LVH, mild apical hypertrophy  - Nuclear (9/07):  Normal study, EF 82%   Recent Labs/Images:  Recent Labs  10/29/13 1604  NA 138  K 3.6  BUN 18  CREATININE 0.88  HGB 14.5     Wt Readings from Last 3 Encounters:  08/31/14 163 lb (73.936 kg)  07/30/14 161 lb (73.029 kg)  10/02/13 172 lb (78.019 kg)     Past Medical History  Diagnosis Date  . Mitral valve prolapse   . Seizure   . SVT (supraventricular tachycardia)     documented short RP tachycardia, previously  adenosine sensitive  . GERD (gastroesophageal reflux disease)   . Hyperlipidemia   . Asymmetric septal hypertrophy   . H/O: rheumatic fever     Current Outpatient Prescriptions  Medication Sig Dispense Refill  . aspirin 325 MG tablet Take 325 mg by mouth daily.      Marland Kitchen co-enzyme Q-10 30 MG capsule Take 30 mg by mouth daily. Hold while in hospital      . diltiazem (CARDIZEM CD) 120 MG 24 hr capsule Take 1 capsule (120 mg total) by mouth daily.  90 capsule  3  . levETIRAcetam (KEPPRA) 500 MG tablet Take 500 mg by mouth Twice daily.      . magnesium oxide (MAG-OX) 400 MG tablet Take 400 mg by mouth daily.      . simvastatin (ZOCOR) 20 MG tablet Take 20 mg by mouth every evening.      . vitamin B-12 (CYANOCOBALAMIN) 500 MCG tablet Take 500 mcg by mouth daily.      . Vitamin D, Ergocalciferol, (DRISDOL) 50000 UNITS CAPS capsule Take 1 tablet by mouth.       No current facility-administered medications for this visit.     Allergies:   Codeine; Cortisone; Nitrofurantoin; Sulfa antibiotics; and Sulfonamide derivatives   Social History:  The patient  reports that she has never smoked. She does not have any smokeless tobacco history on file. She reports that she does not drink alcohol or use illicit drugs.   Family History:  The patient's family history includes Heart attack in her  mother.   ROS:  Please see the history of present illness.       All other systems reviewed and negative.    PHYSICAL EXAM: VS:  BP 152/78  Pulse 59  Ht 5' 0.5" (1.537 m)  Wt 163 lb (73.936 kg)  BMI 31.30 kg/m2 Well nourished, well developed, in no acute distress HEENT: normal Neck:  no JVD Cardiac:  normal S1, S2;  RRR; no murmur Lungs:   clear to auscultation bilaterally, no wheezing, rhonchi or rales Abd: soft, nontender, no hepatomegaly Ext:  no edema Skin: warm and dry Neuro:  CNs 2-12 intact, no focal abnormalities noted  EKG:  Sinus bradycardia, HR 59, incomplete RBBB, LVH, TWI 1, aVL, V5-V6, no  change from prior tracing      ASSESSMENT AND PLAN:  1.  PSVT:  Quiescent on current dose of Diltiazem.  She did have more palpitations while on Simvastatin in conjunction with Diltiazem.  This stopped when she stopped the Simvastatin.      -  Continue Diltiazem.    -  FU with EP as planned.  2.  HYPERTENSION, BENIGN:  Borderline control.  Monitor. 3.  Hyperlipidemia:  Maximum dose of Simvastatin while on Diltiazem is 10 mg.    -  Change Simvastatin to 10 mg QHS.    -  If palpitations recur, DC Simvastatin and start Lipitor.  Disposition:   FU with Dr. Thompson Grayer as planned.    Signed, Versie Starks, MHS 08/31/2014 11:48 AM    [facility] [address], [place],   [address] Phone: [phone]; Fax: [phone]

## 2014-08-31 NOTE — Patient Instructions (Signed)
Keep scheduled follow up with Dr. [name] for January.  Your physician has recommended you make the following change in your medication:  Decrease simvastatin to 10 mg by mouth daily. You can break the 20 mg tablets in half.  Stop this medication and call our office if palpitations return.

## 2014-10-23 ENCOUNTER — Encounter: Payer: Self-pay | Admitting: Internal Medicine

## 2014-11-12 ENCOUNTER — Ambulatory Visit: Payer: Medicare Other | Admitting: Internal Medicine

## 2014-11-28 ENCOUNTER — Emergency Department (HOSPITAL_COMMUNITY)
Admission: EM | Admit: 2014-11-28 | Discharge: 2014-11-28 | Disposition: A | Discharge status: Home / Self Care | Payer: Medicare Other | Attending: Emergency Medicine | Admitting: Emergency Medicine

## 2014-11-28 ENCOUNTER — Emergency Department (HOSPITAL_COMMUNITY): Payer: Medicare Other

## 2014-11-28 ENCOUNTER — Encounter (HOSPITAL_COMMUNITY): Payer: Self-pay | Admitting: Emergency Medicine

## 2014-11-28 DIAGNOSIS — I471 Supraventricular tachycardia: Secondary | ICD-10-CM

## 2014-11-28 DIAGNOSIS — Z79899 Other long term (current) drug therapy: Secondary | ICD-10-CM | POA: Diagnosis not present

## 2014-11-28 DIAGNOSIS — E785 Hyperlipidemia, unspecified: Secondary | ICD-10-CM | POA: Insufficient documentation

## 2014-11-28 DIAGNOSIS — R002 Palpitations: Secondary | ICD-10-CM | POA: Diagnosis present

## 2014-11-28 DIAGNOSIS — G40909 Epilepsy, unspecified, not intractable, without status epilepticus: Secondary | ICD-10-CM | POA: Diagnosis not present

## 2014-11-28 DIAGNOSIS — Z7982 Long term (current) use of aspirin: Secondary | ICD-10-CM | POA: Insufficient documentation

## 2014-11-28 DIAGNOSIS — K219 Gastro-esophageal reflux disease without esophagitis: Secondary | ICD-10-CM | POA: Insufficient documentation

## 2014-11-28 LAB — BASIC METABOLIC PANEL
ANION GAP: 9 (ref 5–15)
BUN: 16 mg/dL (ref 6–23)
CO2: 27 mmol/L (ref 19–32)
Calcium: 9.7 mg/dL (ref 8.4–10.5)
Chloride: 106 mmol/L (ref 96–112)
Creatinine, Ser: 1.15 mg/dL — ABNORMAL HIGH (ref 0.50–1.10)
GFR calc Af Amer: 51 mL/min — ABNORMAL LOW (ref >90)
GFR, EST NON AFRICAN AMERICAN: 44 mL/min — AB (ref >90)
Glucose, Bld: 179 mg/dL — ABNORMAL HIGH (ref 70–99)
POTASSIUM: 4.2 mmol/L (ref 3.5–5.1)
SODIUM: 142 mmol/L (ref 135–145)

## 2014-11-28 LAB — CBC WITH DIFFERENTIAL/PLATELET
BASOS ABS: 0.1 10*3/uL (ref 0.0–0.1)
Basophils Relative: 1 % (ref 0–1)
EOS PCT: 3 % (ref 0–5)
Eosinophils Absolute: 0.3 10*3/uL (ref 0.0–0.7)
HEMATOCRIT: 47 % — AB (ref 36.0–46.0)
HEMOGLOBIN: 15.7 g/dL — AB (ref 12.0–15.0)
LYMPHS ABS: 3.3 10*3/uL (ref 0.7–4.0)
LYMPHS PCT: 41 % (ref 12–46)
MCH: 30.8 pg (ref 26.0–34.0)
MCHC: 33.4 g/dL (ref 30.0–36.0)
MCV: 92.3 fL (ref 78.0–100.0)
MONOS PCT: 8 % (ref 3–12)
Monocytes Absolute: 0.7 10*3/uL (ref 0.1–1.0)
NEUTROS ABS: 3.7 10*3/uL (ref 1.7–7.7)
Neutrophils Relative %: 47 % (ref 43–77)
Platelets: 264 10*3/uL (ref 150–400)
RBC: 5.09 MIL/uL (ref 3.87–5.11)
RDW: 12.7 % (ref 11.5–15.5)
WBC: 8 10*3/uL (ref 4.0–10.5)

## 2014-11-28 LAB — MAGNESIUM: Magnesium: 2.2 mg/dL (ref 1.5–2.5)

## 2014-11-28 LAB — TROPONIN I

## 2014-11-28 NOTE — ED Notes (Addendum)
Pt from home for eval of tachycardia that started this morning, pt states last night she felt like "my heart was skipping beats". Pt denies any chest pain at this time, states that chest pain went away after vomiting twice. HR noted to be 169 a-flutter in triage.

## 2014-11-28 NOTE — ED Provider Notes (Addendum)
CSN: [id number]     Arrival date & time 11/28/14  1324 History   First MD Initiated Contact with Patient 11/28/14 1337     Chief Complaint  Patient presents with  . Tachycardia     (Consider location/radiation/quality/duration/timing/severity/associated sxs/prior Treatment) Patient is a 81 y.o. female presenting with palpitations. The history is provided by the patient.  Palpitations Palpitations quality:  Fast Onset quality:  Gradual Duration:  5 hours Timing:  Constant Progression:  Unchanged Chronicity:  Recurrent Context: not anxiety, not bronchodilators, not caffeine and not stimulant use   Context comment:  Felt heart skipping beats last night and became fast this morning Relieved by:  Nothing Worsened by:  Nothing tried Ineffective treatments:  None tried Associated symptoms: dizziness, nausea and shortness of breath   Associated symptoms: no chest pain, no chest pressure, no diaphoresis, no near-syncope and no weakness   Risk factors comment:  Hx of svt   Past Medical History  Diagnosis Date  . Mitral valve prolapse   . Seizure   . SVT (supraventricular tachycardia)     documented short RP tachycardia, previously adenosine sensitive  . GERD (gastroesophageal reflux disease)   . Hyperlipidemia   . Asymmetric septal hypertrophy   . H/O: rheumatic fever    Past Surgical History  Procedure Laterality Date  . Abdominal hysterectomy     Family History  Problem Relation Age of Onset  . Heart attack Mother    History  Substance Use Topics  . Smoking status: Never Smoker   . Smokeless tobacco: Not on file  . Alcohol Use: No   OB History    No data available     Review of Systems  Constitutional: Negative for diaphoresis.  Respiratory: Positive for shortness of breath.   Cardiovascular: Positive for palpitations. Negative for chest pain and near-syncope.  Gastrointestinal: Positive for nausea.  Neurological: Positive for dizziness.  All other systems  reviewed and are negative.     Allergies  Codeine; Cortisone; Nitrofurantoin; Sulfa antibiotics; and Sulfonamide derivatives  Home Medications   Prior to Admission medications   Medication Sig Start Date End Date Taking? Authorizing Provider  aspirin 325 MG tablet Take 325 mg by mouth daily.    Historical Provider, MD  co-enzyme Q-10 30 MG capsule Take 30 mg by mouth daily. Hold while in hospital    Historical Provider, MD  diltiazem (CARDIZEM CD) 120 MG 24 hr capsule Take 1 capsule (120 mg total) by mouth daily. 07/30/14   Brittainy Erie Noe, PA-C  levETIRAcetam (KEPPRA) 500 MG tablet Take 500 mg by mouth Twice daily. 12/10/11   Historical Provider, MD  magnesium oxide (MAG-OX) 400 MG tablet Take 400 mg by mouth daily.    Historical Provider, MD  simvastatin (ZOCOR) 20 MG tablet Take 0.5 tablets (10 mg total) by mouth at bedtime. 08/31/14   Liliane Shi, PA-C  vitamin B-12 (CYANOCOBALAMIN) 500 MCG tablet Take 500 mcg by mouth daily.    Historical Provider, MD  Vitamin D, Ergocalciferol, (DRISDOL) 50000 UNITS CAPS capsule Take 1 tablet by mouth.    Historical Provider, MD   BP 101/45 mmHg  Pulse 83  Temp(Src) 98.2 F (36.8 C) (Oral)  Resp 17  SpO2 94% Physical Exam  Constitutional: She is oriented to person, place, and time. She appears well-developed and well-nourished. No distress.  HENT:  Head: Normocephalic and atraumatic.  Mouth/Throat: Oropharynx is clear and moist.  Eyes: Conjunctivae and EOM are normal. Pupils are equal, round, and reactive to light.  Neck: Normal range of motion. Neck supple.  Cardiovascular: Normal rate, regular rhythm and intact distal pulses.   No murmur heard. Pulmonary/Chest: Effort normal and breath sounds normal. No respiratory distress. She has no wheezes. She has no rales.  Abdominal: Soft. She exhibits no distension. There is no tenderness. There is no rebound and no guarding.  Musculoskeletal: Normal range of motion. She exhibits no edema or  tenderness.  Neurological: She is alert and oriented to person, place, and time.  Skin: Skin is warm and dry. No rash noted. No erythema.  Psychiatric: She has a normal mood and affect. Her behavior is normal.  Nursing note and vitals reviewed.   ED Course  Procedures (including critical care time) Labs Review Labs Reviewed  CBC WITH DIFFERENTIAL/PLATELET - Abnormal; Notable for the following:    Hemoglobin 15.7 (*)    HCT 47.0 (*)    All other components within normal limits  BASIC METABOLIC PANEL - Abnormal; Notable for the following:    Glucose, Bld 179 (*)    Creatinine, Ser 1.15 (*)    GFR calc non Af Amer 44 (*)    GFR calc Af Amer 51 (*)    All other components within normal limits  TROPONIN I  MAGNESIUM    Imaging Review Dg Chest Port 1 View  11/28/2014   CLINICAL DATA:  Palpitations.  EXAM: PORTABLE CHEST - 1 VIEW  COMPARISON:  October 29, 2013.  FINDINGS: The heart size and mediastinal contours are within normal limits. Both lungs are clear. No pneumothorax or pleural effusion is noted. The visualized skeletal structures are unremarkable.  IMPRESSION: No acute cardiopulmonary abnormality seen.   Electronically Signed   By: Sabino Dick M.D.   On: 11/28/2014 14:33     EKG Interpretation   Date/Time:  Wednesday November 28 2014 13:36:17 EST Ventricular Rate:  83 PR Interval:  181 QRS Duration: 108 QT Interval:  390 QTC Calculation: 458 R Axis:   -45 Text Interpretation:  Sinus rhythm LAD, consider left anterior fascicular  block Abnormal R-wave progression, early transition LVH with secondary  repolarization abnormality resolution of atrial flutter Confirmed by  Maryan Rued  MD, Loree Fee ([id number]) on 11/28/2014 1:39:03 PM      MDM   Final diagnoses:  Palpitation  SVT (supraventricular tachycardia)   Pt with hx of svt who presents with palitations with initial ekg showing svt vs aflutter who spontaneously converted prior to intervention.  Denies cp but some  sob,n/v that is now resolved.  Taking cardizem and no missed doses.  2:44 PM All labs are wnl.  Pt is feeling fine.  Will d/c home to continue cardizem and f/u with Dr. Rosalita Levan, MD 11/28/14 [place], MD 11/28/14 [id number]

## 2014-11-28 NOTE — ED Notes (Signed)
Pt placed on heart monitor,  Noted to be NSR 87 HR.

## 2014-12-13 ENCOUNTER — Ambulatory Visit (INDEPENDENT_AMBULATORY_CARE_PROVIDER_SITE_OTHER): Payer: Medicare Other | Admitting: Internal Medicine

## 2014-12-13 ENCOUNTER — Encounter: Payer: Self-pay | Admitting: Internal Medicine

## 2014-12-13 VITALS — BP 120/64 | HR 65 | Ht 65.5 in | Wt 169.0 lb

## 2014-12-13 DIAGNOSIS — I471 Supraventricular tachycardia: Secondary | ICD-10-CM

## 2014-12-13 DIAGNOSIS — I495 Sick sinus syndrome: Secondary | ICD-10-CM

## 2014-12-13 MED ORDER — DILTIAZEM HCL 60 MG PO TABS: ORAL_TABLET | ORAL | Status: DC

## 2014-12-13 NOTE — Patient Instructions (Addendum)
  Your physician has recommended you make the following change in your medication:   Continue diltiazem CD 120 mg daily If you get palpitations, OK take diltiazem 60 mg if they do not resolve quickly. Diltizazem 60 mg is a short-acting medication, NOT a substitute for your daily diltiazem CD.  For constipation, OK to take Miralax or Metamucil daily. Generics are fine. Do not take at the same time as your other medications.   Your physician wants you to follow-up in: 12 months with Dr Vallery Ridge will receive a reminder letter in the mail two months in advance. If you don't receive a letter, please call our office to schedule the follow-up appointment.

## 2014-12-13 NOTE — Progress Notes (Signed)
Cardiology Office Note   Date:  12/13/2014   ID:  Carla Flowers, DOB [birth year], MRN [id number]  PCP:  [name], MD  Cardiologist:  Dr. Madolyn Frieze, PA-C   Chief Complaint  Patient presents with  . Follow-up    PSVT    History of Present Illness: Carla Flowers is a 81 y.o. female who presents for f/u after ER visit for SVT.  08/2013 ER visit at [facility] for SVT, converted w/ Adenosine 04/2014 ER visit at [place], [place], Adenosine 06/2014 Hospitalized in [place], [place] for recurrent SVT for 2 days. Metoprolol decreased due to bradycardia. 07/2014 Office visit, RFCA offered, pt refused due to hx problems w/ anesthesia. Recurrent palpitations so BB d/c'd and diltiazem added. 08/2014 Seen in office, pt had skips, stopped simva, palps improved. Simva restarted at 10 mg/day.  11/28/2014 Seen in ER w/ onset of tachypalps after having N&V x 2 days. HR 169, symptoms resolved before ECG obtained.   12/13/2014 Office visit today. Pt has been doing well. On diltiazem, has only had 2 brief episodes of palps since October. The longer one lasted about an hour, both resolved spontaneously. When she gets palps, she tries ValSalva, but when it does not work, she goes to the ER. No palps since ER visit. Generally doing well, GI illness resolved but has chronic problems w/ constipation (hx diverticulosis) and occasional diarrhea, no LE edema, no chest pain, no SOB except when she climbs a long flight of steps.   Past Medical History  Diagnosis Date  . Mitral valve prolapse   . Seizure   . SVT (supraventricular tachycardia)     documented short RP tachycardia, previously adenosine sensitive  . GERD (gastroesophageal reflux disease)   . Hyperlipidemia   . Asymmetric septal hypertrophy   . H/O: rheumatic fever     Past Surgical History  Procedure Laterality Date  . Abdominal hysterectomy     Current Outpatient Prescriptions  Medication Sig Dispense Refill  . aspirin 325  MG tablet Take 325 mg by mouth daily.    Marland Kitchen co-enzyme Q-10 30 MG capsule Take 30 mg by mouth daily. Hold while in hospital    . diltiazem (CARDIZEM CD) 120 MG 24 hr capsule Take 1 capsule (120 mg total) by mouth daily. 90 capsule 3  . levETIRAcetam (KEPPRA) 500 MG tablet Take 500 mg by mouth Twice daily.    . magnesium oxide (MAG-OX) 400 MG tablet Take 400 mg by mouth daily.    . simvastatin (ZOCOR) 20 MG tablet Take 0.5 tablets (10 mg total) by mouth at bedtime. 15 tablet 3  . vitamin B-12 (CYANOCOBALAMIN) 500 MCG tablet Take 500 mcg by mouth daily.    . Vitamin D, Ergocalciferol, (DRISDOL) 50000 UNITS CAPS capsule Take 1 tablet by mouth every 14 (fourteen) days.     Marland Kitchen diltiazem (CARDIZEM) 60 MG tablet Take 1-2 tablets daily as needed for palpitations 30 tablet 2   No current facility-administered medications for this visit.    Allergies:   Sulfa antibiotics; Codeine; Cortisone; Nitrofurantoin; and Sulfonamide derivatives    Social History:  The patient  reports that she has never smoked. She does not have any smokeless tobacco history on file. She reports that she does not drink alcohol or use illicit drugs.   Family History:  The patient's family history includes Heart attack in her mother.    ROS:  Please see the history of present illness.   Otherwise, review of systems are positive  for none.   All other systems are reviewed and negative.    PHYSICAL EXAM: VS:  BP 120/64 mmHg  Pulse 65  Ht 5' 5.5" (1.664 m)  Wt 169 lb (76.658 kg)  BMI 27.69 kg/m2 , BMI Body mass index is 27.69 kg/(m^2). GEN: Well nourished, well developed, in no acute distress HEENT: normal Neck: no JVD, carotid bruits, or masses Cardiac: RRR; 2/6 murmur, no rubs, or gallops,no edema  Respiratory:  clear to auscultation bilaterally, normal work of breathing GI: soft, nontender, nondistended, + BS MS: no deformity or atrophy Skin: warm and dry, no rash Neuro:  Strength and sensation are intact Psych:  euthymic mood, full affect   EKG:  EKG is ordered today. The ekg ordered today demonstrates SR, no acute ischemic changes   Recent Labs: 11/28/2014: BUN 16; Creatinine 1.15*; Hemoglobin 15.7*; Magnesium 2.2; Platelets 264; Potassium 4.2; Sodium 142    Lipid Panel No results found for: CHOL, TRIG, HDL, CHOLHDL, VLDL, LDLCALC, LDLDIRECT    Wt Readings from Last 3 Encounters:  12/13/14 169 lb (76.658 kg)  08/31/14 163 lb (73.936 kg)  07/30/14 161 lb (73.029 kg)     Other studies Reviewed: Additional studies/ records that were reviewed today include: ER visit and office notes. Review of the above records demonstrates: no ECG from recent ER visit in SVT, HR documented as 169 in triage.   ASSESSMENT AND PLAN:  1. PSVT (paroxysmal supraventricular tachycardia) - Pt has generally done well on the Cardizem, is concerned that her constipation (chronic problem) has worsened on this med. - did not tolerate metoprolol due to bradycardia and ineffectiveness - since pt rarely has episodes on the Cardizem and BP is controlled, would not increase daily dose - will give her prn short-acting Cardizem 60 mg to take when she gets palpitations - She still does not wish to undergo ablation due to concerns about the anesthesia - if the Cardizem does not work, consider pill-in-pocket  Flecainide for breakthrough episodes   Current medicines are reviewed at length with the patient today.  The patient has concerns regarding medicines. Reassured her OK to take simva 10 mg daily with the cardizem.  The following changes have been made:  Cardizem 60 mg 1-2 x day PRN for palpitations  Labs/ tests ordered today include:  No orders of the defined types were placed in this encounter.     Disposition:   FU with Dr. [name] in 1 year or as needed.   Carla Speak, PA-C  12/13/2014 12:18 PM    [facility] [place], [place], [place]  [address] Phone: [phone];  Fax: [phone]    I have seen, examined the patient, and reviewed the above assessment and plan.  Changes to above are made where necessary.  SVT is better controlled. She continues to want to avoid ablation.    Co Sign: Thompson Grayer, MD 12/13/2014 10:11 PM

## 2015-03-14 ENCOUNTER — Telehealth: Payer: Self-pay | Admitting: Internal Medicine

## 2015-03-14 NOTE — Telephone Encounter (Signed)
New Prob   Pt c/o medication issue:  1. Name of Medication: Diltiazem  2. How are you currently taking this medication (dosage and times per day)? 120 mg once daily  3. Are you having a reaction (difficulty breathing--STAT)? No  4. What is your medication issue? Pt states she is having palpitations and suspects this is related to this medication.   Please call.

## 2015-03-14 NOTE — Telephone Encounter (Signed)
Spoke with patient and let her know Dr Jackalyn Lombard recommendation:  1. PSVT (paroxysmal supraventricular tachycardia) - Pt has generally done well on the Cardizem, is concerned that her constipation (chronic problem) has worsened on this med. - did not tolerate metoprolol due to bradycardia and ineffectiveness - since pt rarely has episodes on the Cardizem and BP is controlled, would not increase daily dose - will give her prn short-acting Cardizem 60 mg to take when she gets palpitations - She still does not wish to undergo ablation due to concerns about the anesthesia - if the Cardizem does not work, consider pill-in-pocket  Flecainide for breakthrough episodes

## 2015-07-26 ENCOUNTER — Other Ambulatory Visit: Payer: Self-pay | Admitting: Cardiology

## 2015-07-26 NOTE — Telephone Encounter (Signed)
Thompson Grayer, MD at 12/13/2014 12:03 PM  diltiazem (CARDIZEM CD) 120 MG 24 hr capsuleTake 1 capsule (120 mg total) by mouth daily Your physician has recommended you make the following change in your medication:   Continue diltiazem CD 120 mg daily If you get palpitations, OK take diltiazem 60 mg if they do not resolve quickly.

## 2015-09-16 ENCOUNTER — Telehealth: Payer: Self-pay | Admitting: Internal Medicine

## 2015-09-16 NOTE — Telephone Encounter (Signed)
New message    Pt C/O medication issue:  1. Name of Medication: atorvastatin 10 mg - prescribe by PCP  / Diltiazem 120 mg -prescribe by allred   2. How are you currently taking this medication (dosage and times per day)? Atorvastatin At night  /   3. Are you having a reaction (difficulty breathing--STAT)? Could not sleep, heart skip beat   4. What is your medication issue? Would like Dr [name] to prescribe her something per conversation with her PCP  - patient states she has stop taken medication

## 2015-09-16 NOTE — Telephone Encounter (Signed)
Spoke with patient and she says she had SE with Atorvastatin and Diltiazem   Dr Melford Aase and Dr [phone]) both follow her as PCP's.  She stopped the Atorvastatin and all her symptoms went away.  She is requesting Dr [name] prescribe something for her cholesterol.  I let her know that as Dr [name] does not follow her cholesterol her would like for Dr Abran Duke to call in for her.  I have spoken to Faith  at Dr Abran Duke office and they will handle.  Patient aware

## 2015-09-16 NOTE — Telephone Encounter (Signed)
New Message    Pt calling stating that the prescriptions she previously spoke to [place] about need to be called in to the [facility] [address] in [place]. Please call back and advise.

## 2015-09-16 NOTE — Telephone Encounter (Signed)
Called patient back and let her know it's Dr Abran Duke office that will be calling in prescription

## 2015-10-09 ENCOUNTER — Encounter: Payer: Self-pay | Admitting: Internal Medicine

## 2015-10-09 ENCOUNTER — Encounter: Payer: Self-pay | Admitting: Cardiovascular Disease

## 2016-01-13 ENCOUNTER — Other Ambulatory Visit: Payer: Self-pay

## 2016-01-13 MED ORDER — DILTIAZEM HCL ER COATED BEADS 120 MG PO CP24: 120.0000 mg | ORAL_CAPSULE | Freq: Every day | ORAL | Status: DC

## 2016-01-16 ENCOUNTER — Encounter: Payer: Self-pay | Admitting: Internal Medicine

## 2016-02-05 ENCOUNTER — Ambulatory Visit: Payer: Medicare Other | Admitting: Internal Medicine

## 2016-02-24 ENCOUNTER — Encounter: Payer: Self-pay | Admitting: Internal Medicine

## 2016-02-24 ENCOUNTER — Ambulatory Visit (INDEPENDENT_AMBULATORY_CARE_PROVIDER_SITE_OTHER): Payer: Medicare Other | Admitting: Internal Medicine

## 2016-02-24 VITALS — BP 128/74 | HR 63 | Ht 65.5 in | Wt 158.2 lb

## 2016-02-24 DIAGNOSIS — I471 Supraventricular tachycardia: Secondary | ICD-10-CM | POA: Diagnosis not present

## 2016-02-24 MED ORDER — ASPIRIN 81 MG PO TABS: 81.0000 mg | ORAL_TABLET | Freq: Every day | ORAL | Status: DC

## 2016-02-24 NOTE — Patient Instructions (Signed)
Medication Instructions:  Your physician has recommended you make the following change in your medication:  1) Decrease Aspirin to 81 mg daily   Labwork: None ordered   Testing/Procedures: None ordered   Follow-Up: Your physician wants you to follow-up in: 12 months with Dr Vallery Ridge will receive a reminder letter in the mail two months in advance. If you don't receive a letter, please call our office to schedule the follow-up appointment.   Any Other Special Instructions Will Be Listed Below (If Applicable).     If you need a refill on your cardiac medications before your next appointment, please call your pharmacy.

## 2016-02-24 NOTE — Progress Notes (Signed)
Cardiology Office Note   Date:  02/24/2016   ID:  Carla Flowers, DOB May 15, 1932, MRN [id number]  PCP:  [name], MD  Cardiologist:  Dr. Rivka Spring, MD   Chief Complaint  Patient presents with  . svt    History of Present Illness: Carla Flowers is a 82 y.o. female who presents for f/u of SVT. She is doing well.  Only 2 episodes of SVT in past year.  Resolved with prn cardizem. Denies CP, SOB, palpitations, presyncope, syncope or other concerns.   Past Medical History  Diagnosis Date  . Mitral valve prolapse   . Seizure ([place])   . SVT (supraventricular tachycardia) ([place])     documented short RP tachycardia, previously adenosine sensitive  . GERD (gastroesophageal reflux disease)   . Hyperlipidemia   . Asymmetric septal hypertrophy (H[facility])   . H/O: rheumatic fever     Past Surgical History  Procedure Laterality Date  . Abdominal hysterectomy     Current Outpatient Prescriptions  Medication Sig Dispense Refill  . aspirin 325 MG tablet Take 325 mg by mouth daily.    . Coenzyme Q10 (COQ-10) 100 MG CAPS Take 1 capsule by mouth daily.    Marland Kitchen diltiazem (CARDIZEM CD) 120 MG 24 hr capsule Take 1 capsule (120 mg total) by mouth daily. 90 capsule 0  . diltiazem (CARDIZEM) 60 MG tablet Take 1-2 tablets daily as needed for palpitations 30 tablet 2  . fluconazole (DIFLUCAN) 150 MG tablet Take 150 mg by mouth daily.    Marland Kitchen levETIRAcetam (KEPPRA) 500 MG tablet Take 500 mg by mouth Twice daily.    . rosuvastatin (CRESTOR) 20 MG tablet Take 20 mg by mouth daily. Take at bedtime    . simvastatin (ZOCOR) 20 MG tablet Take 0.5 tablets (10 mg total) by mouth at bedtime. 15 tablet 3  . vitamin B-12 (CYANOCOBALAMIN) 500 MCG tablet Take 500 mcg by mouth daily.    . Vitamin D, Ergocalciferol, (DRISDOL) 50000 UNITS CAPS capsule Take 1 tablet by mouth every 14 (fourteen) days.      No current facility-administered medications for this visit.    Allergies:   Sulfa  antibiotics; Codeine; Cortisone; Nitrofurantoin; and Sulfonamide derivatives    Social History:  The patient  reports that she has never smoked. She does not have any smokeless tobacco history on file. She reports that she does not drink alcohol or use illicit drugs.   Family History:  The patient's family history includes Heart attack in her mother.    ROS:  Please see the history of present illness.   Otherwise, review of systems are positive for none.   All other systems are reviewed and negative.    PHYSICAL EXAM: VS:  BP 128/74 mmHg  Pulse 63  Ht 5' 5.5" (1.664 m)  Wt 158 lb 3.2 oz (71.759 kg)  BMI 25.92 kg/m2 , BMI Body mass index is 25.92 kg/(m^2). GEN: Well nourished, well developed, in no acute distress HEENT: normal Neck: no JVD, carotid bruits, or masses Cardiac: RRR; 2/6 murmur, no rubs, or gallops,no edema  Respiratory:  clear to auscultation bilaterally, normal work of breathing GI: soft, nontender, nondistended, + BS MS: no deformity or atrophy Skin: warm and dry, no rash Neuro:  Strength and sensation are intact Psych: euthymic mood, full affect   EKG:  EKG is ordered today. The ekg ordered today demonstrates SR, no acute ischemic changes   Recent Labs: No results found for requested labs within  last 365 days.    Lipid Panel No results found for: CHOL, TRIG, HDL, CHOLHDL, VLDL, LDLCALC, LDLDIRECT    Wt Readings from Last 3 Encounters:  02/24/16 158 lb 3.2 oz (71.759 kg)  12/13/14 169 lb (76.658 kg)  08/31/14 163 lb (73.936 kg)     ASSESSMENT AND PLAN:  1. SVT Well controlled Wishes to avoid ablation Continue daily cardizem CD with prn short-acting Cardizem 60 mg to take when she gets palpitations If the Cardizem does not work, consider pill-in-pocket Flecainide for breakthrough episodes  2. Cataracts Ok to proceed with surgery without further CV testing  3. HL Followed by PCP Reduce ASA to 81mg  daily  Disposition:   FU with me in 1 year or  as needed.   Signed, Thompson Grayer, MD  02/24/2016 4:09 PM    [facility] [place], [place], [place]  [address] Phone: [phone]; Fax: [phone]

## 2016-04-17 ENCOUNTER — Other Ambulatory Visit: Payer: Self-pay | Admitting: *Deleted

## 2016-04-17 MED ORDER — DILTIAZEM HCL ER COATED BEADS 120 MG PO CP24: 120.0000 mg | ORAL_CAPSULE | Freq: Every day | ORAL | Status: DC

## 2016-05-26 ENCOUNTER — Encounter (HOSPITAL_COMMUNITY): Payer: Self-pay | Admitting: Emergency Medicine

## 2016-05-26 ENCOUNTER — Emergency Department (HOSPITAL_COMMUNITY): Payer: Medicare Other

## 2016-05-26 ENCOUNTER — Telehealth: Payer: Self-pay | Admitting: Internal Medicine

## 2016-05-26 ENCOUNTER — Emergency Department (HOSPITAL_COMMUNITY)
Admission: EM | Admit: 2016-05-26 | Discharge: 2016-05-26 | Disposition: A | Discharge status: Home / Self Care | Payer: Medicare Other | Attending: Emergency Medicine | Admitting: Emergency Medicine

## 2016-05-26 DIAGNOSIS — Z79899 Other long term (current) drug therapy: Secondary | ICD-10-CM | POA: Diagnosis not present

## 2016-05-26 DIAGNOSIS — I471 Supraventricular tachycardia: Secondary | ICD-10-CM | POA: Insufficient documentation

## 2016-05-26 DIAGNOSIS — R000 Tachycardia, unspecified: Secondary | ICD-10-CM | POA: Diagnosis present

## 2016-05-26 DIAGNOSIS — I10 Essential (primary) hypertension: Secondary | ICD-10-CM | POA: Insufficient documentation

## 2016-05-26 DIAGNOSIS — Z7982 Long term (current) use of aspirin: Secondary | ICD-10-CM | POA: Insufficient documentation

## 2016-05-26 LAB — CBC
HCT: 45.7 % (ref 36.0–46.0)
Hemoglobin: 14.8 g/dL (ref 12.0–15.0)
MCH: 30.1 pg (ref 26.0–34.0)
MCHC: 32.4 g/dL (ref 30.0–36.0)
MCV: 93.1 fL (ref 78.0–100.0)
PLATELETS: 262 10*3/uL (ref 150–400)
RBC: 4.91 MIL/uL (ref 3.87–5.11)
RDW: 13.2 % (ref 11.5–15.5)
WBC: 9.7 10*3/uL (ref 4.0–10.5)

## 2016-05-26 LAB — BASIC METABOLIC PANEL
Anion gap: 6 (ref 5–15)
BUN: 12 mg/dL (ref 6–20)
CALCIUM: 10.3 mg/dL (ref 8.9–10.3)
CO2: 26 mmol/L (ref 22–32)
CREATININE: 1.02 mg/dL — AB (ref 0.44–1.00)
Chloride: 109 mmol/L (ref 101–111)
GFR calc Af Amer: 58 mL/min — ABNORMAL LOW (ref >60)
GFR, EST NON AFRICAN AMERICAN: 50 mL/min — AB (ref >60)
Glucose, Bld: 142 mg/dL — ABNORMAL HIGH (ref 65–99)
POTASSIUM: 4.2 mmol/L (ref 3.5–5.1)
SODIUM: 141 mmol/L (ref 135–145)

## 2016-05-26 NOTE — ED Notes (Signed)
Pt back from X-ray.  

## 2016-05-26 NOTE — Telephone Encounter (Signed)
New message     The pt is having tachycardia and really wants to seen today.    Pt c/o of Chest Pain: STAT if CP now or developed within 24 hours  1. Are you having CP right now? Not right now 2. Are you experiencing any other symptoms (ex. SOB, nausea, vomiting, sweating)? Nausea,vomiting  3. How long have you been experiencing CP? Since 8 am this mrning  4. Is your CP continuous or coming and going? Coming and going quickly  5. Have you taken Nitroglycerin? No

## 2016-05-26 NOTE — Telephone Encounter (Signed)
Pt calling to inform Dr [name] and Claiborne Billings RN that she has been experiencing tachycardia at a rate of 180 bpm, and her BP is 150/98.  Pt states she also has left sided arm pain , midsternal chest pain and jaw pain.  Pt states she is sob as well.  Pt states she has vomited x 2 and is still very nauseated.  Pt states she has taken all her meds prescribed, even her PRN diltiazem for palpitations.  Pt states that even with taking all meds prescribed, her HR and symptoms remain the same. Pt would like advice on what she should do.  Pt states she is currently at [facility] with her Spouse, who is receiving chemo at this time.  Informed the pt that Dr [name] and Claiborne Billings are both out of the office today.  Advised the pt that she should go down to the ER at [facility] and have them do an EKG and treat her accordingly.  Informed the pt that if she is acute and needs further intervention, they will stabilize her and send her to [facility].  Informed the pt that I will make Dr [name] and Claiborne Billings aware of this call and her complaints.  Pt verbalized understanding and agrees with this plan.  Pt states she will go down to the ER now at [facility].

## 2016-05-26 NOTE — ED Triage Notes (Signed)
Pt here for tachycardia; pt sts hx of same

## 2016-05-26 NOTE — ED Provider Notes (Signed)
[facility] DEPT Provider Note   CSN: [id number] Arrival date & time: 05/26/16  1211  First Provider Contact:  First MD Initiated Contact with Patient 05/26/16 1230        History   Chief Complaint Chief Complaint  Patient presents with  . Tachycardia    HPI Carla Flowers is a 82 y.o. female.  HPI Patient has a history of paroxysmal SVT.  She presents the emergency department with palpitations that began this morning.  She took her normal dose of Cardizem but did not take her additional as needed dose of Cardizem.  She denies recent change in her medications.  She's otherwise been compliant with her medications.  She denies chest pain or chest tightness.  On arrival to the emergency department her rhythm converted to normal sinus rhythm.  She is asymptomatic at this time    Past Medical History:  Diagnosis Date  . Asymmetric septal hypertrophy (HCC)   . GERD (gastroesophageal reflux disease)   . H/O: rheumatic fever   . Hyperlipidemia   . Mitral valve prolapse   . Seizure ([place])   . SVT (supraventricular tachycardia) ([facility])    documented short RP tachycardia, previously adenosine sensitive    Patient Active Problem List   Diagnosis Date Noted  . Shortness of breath 10/02/2013  . Chest pain 10/02/2013  . Hyperlipidemia 05/07/2013  . 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 HYPERTENSION, BENIGN 11/17/2010  . PSVT 11/17/2010  . 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 PALPITATIONS 10/01/2009    Past Surgical History:  Procedure Laterality Date  . ABDOMINAL HYSTERECTOMY      OB History    No data available       Home Medications    Prior to Admission medications   Medication Sig Start Date End Date Taking? Authorizing Provider  aspirin 81 MG tablet Take 1 tablet (81 mg total) by mouth daily. 02/24/16   Thompson Grayer, MD  Coenzyme Q10 (COQ-10) 100 MG CAPS Take 1 capsule by mouth daily.    Historical Provider, MD  diltiazem  (CARDIZEM CD) 120 MG 24 hr capsule Take 1 capsule (120 mg total) by mouth daily. 04/17/16   Thompson Grayer, MD  diltiazem (CARDIZEM) 60 MG tablet Take 1-2 tablets daily as needed for palpitations 12/13/14   Thompson Grayer, MD  fluconazole (DIFLUCAN) 150 MG tablet Take 150 mg by mouth daily.    Historical Provider, MD  levETIRAcetam (KEPPRA) 500 MG tablet Take 500 mg by mouth Twice daily. 12/10/11   Historical Provider, MD  rosuvastatin (CRESTOR) 20 MG tablet Take 20 mg by mouth daily. Take at bedtime    Historical Provider, MD  vitamin B-12 (CYANOCOBALAMIN) 500 MCG tablet Take 500 mcg by mouth daily.    Historical Provider, MD  Vitamin D, Ergocalciferol, (DRISDOL) 50000 UNITS CAPS capsule Take 1 tablet by mouth every 14 (fourteen) days.     Historical Provider, MD    Family History Family History  Problem Relation Age of Onset  . Heart attack Mother     Social History Social History  Substance Use Topics  . Smoking status: Never Smoker  . Smokeless tobacco: Not on file  . Alcohol use No     Allergies   Sulfa antibiotics; Codeine; Cortisone; Nitrofurantoin; and Sulfonamide derivatives   Review of Systems Review of Systems  All other systems reviewed and are negative.    Physical Exam Updated Vital Signs BP 123/70   Pulse 65   Temp 97.4 F (36.3 C) (  Oral)   Resp 19   SpO2 95%   Physical Exam  Constitutional: She is oriented to person, place, and time. She appears well-developed and well-nourished. No distress.  HENT:  Head: Normocephalic and atraumatic.  Eyes: EOM are normal.  Neck: Normal range of motion.  Cardiovascular: Normal rate, regular rhythm and normal heart sounds.   Pulmonary/Chest: Effort normal and breath sounds normal.  Abdominal: Soft. She exhibits no distension. There is no tenderness.  Musculoskeletal: Normal range of motion.  Neurological: She is alert and oriented to person, place, and time.  Skin: Skin is warm and dry.  Psychiatric: She has a normal  mood and affect. Judgment normal.  Nursing note and vitals reviewed.    ED Treatments / Results  Labs (all labs ordered are listed, but only abnormal results are displayed) Labs Reviewed  BASIC METABOLIC PANEL - Abnormal; Notable for the following:       Result Value   Glucose, Bld 142 (*)    Creatinine, Ser 1.02 (*)    GFR calc non Af Amer 50 (*)    GFR calc Af Amer 58 (*)    All other components within normal limits  CBC    EKG  EKG Interpretation #1 Date/Time:  Tuesday May 26 2016 12:14:23 EDT Ventricular Rate:  171 PR Interval:    QRS Duration: 100 QT Interval:  276 QTC Calculation: 465 R Axis:   -43 Text Interpretation:  Supraventricular tachycardia Left axis deviation Incomplete right bundle branch block Left ventricular hypertrophy Cannot rule out Septal infarct , age undetermined Marked ST abnormality, possible lateral subendocardial injury Abnormal ECG changed from prior ecg Confirmed by Alejos Reinhardt  MD, Lennette Bihari ([id number]) on 05/26/2016 3:19:16 PM       ECG interpretation #2  Date: 05/26/2016  1216pm  Rate: 93  Rhythm: normal sinus rhythm  QRS Axis: normal  Intervals: normal  ST/T Wave abnormalities: normal  Conduction Disutrbances: none  Narrative Interpretation:   Old EKG Reviewed: svt no longer present    Radiology Dg Chest 2 View  Result Date: 05/26/2016 CLINICAL DATA:  82 year old with an episode of supraventricular tachycardia which began earlier today causing her to have dizziness. Current history of mitral valve prolapse. EXAM: CHEST  2 VIEW COMPARISON:  11/28/2014, 10/29/2013. FINDINGS: Cardiac silhouette mildly enlarged, unchanged. Thoracic aorta mildly atherosclerotic, unchanged. Hilar and mediastinal contours otherwise unremarkable. Lungs clear. Bronchovascular markings normal. Pulmonary vascularity normal. No visible pleural effusions. No pneumothorax. Osseous demineralization. IMPRESSION: Stable mild cardiomegaly. No acute cardiopulmonary disease. Mild  thoracic aortic atherosclerosis. Electronically Signed   By: Evangeline Dakin M.D.   On: 05/26/2016 14:00   Procedures Procedures (including critical care time)  Medications Ordered in ED Medications - No data to display   Initial Impression / Assessment and Plan / ED Course  I have reviewed the triage vital signs and the nursing notes.  Pertinent labs & imaging results that were available during my care of the patient were reviewed by me and considered in my medical decision making (see chart for details).  Clinical Course    3:20 PM Patient continues to be a simple maggot this time.  Workup without abnormality.  She converted on her own without any medications.  Outpatient primary care and cardiology follow-up.  No change in her medications warranted.  Asymptomatic now.  She was F Ln. SVT earlier this morning however.  no symptoms to suggest heart failure   Final Clinical Impressions(s) / ED Diagnoses   Final diagnoses:  SVT (supraventricular tachycardia) (  [facility])    New Prescriptions New Prescriptions   No medications on file     Jola Schmidt, MD 05/26/16 1521

## 2016-05-26 NOTE — ED Notes (Signed)
Patient transported to X-ray 

## 2016-05-26 NOTE — ED Notes (Signed)
Carla Flowers; Transporter, transporting pt to x-ray.  

## 2016-09-30 ENCOUNTER — Other Ambulatory Visit: Payer: Self-pay | Admitting: Internal Medicine

## 2017-02-08 ENCOUNTER — Encounter: Payer: Self-pay | Admitting: Internal Medicine

## 2017-02-24 ENCOUNTER — Encounter: Payer: Self-pay | Admitting: Internal Medicine

## 2017-02-24 ENCOUNTER — Ambulatory Visit (INDEPENDENT_AMBULATORY_CARE_PROVIDER_SITE_OTHER): Payer: Medicare Other | Admitting: Internal Medicine

## 2017-02-24 ENCOUNTER — Encounter (INDEPENDENT_AMBULATORY_CARE_PROVIDER_SITE_OTHER): Payer: Self-pay

## 2017-02-24 VITALS — BP 122/66 | HR 68 | Ht 65.5 in | Wt 156.0 lb

## 2017-02-24 DIAGNOSIS — I471 Supraventricular tachycardia: Secondary | ICD-10-CM

## 2017-02-24 NOTE — Patient Instructions (Signed)

## 2017-02-24 NOTE — Progress Notes (Signed)
Cardiology Office Note   Date:  02/24/2017   ID:  Carla Flowers, DOB 27-Feb-1932, MRN [id number]  PCP:  [name], MD  Cardiologist:  Dr. Rivka Spring, MD   Chief Complaint  Patient presents with  . Follow-up    PSVT    History of Present Illness: Carla Flowers is a 83 y.o. female who presents for f/u of SVT.  She has done well.  Last episode of SVT was 7/18.   She is primarily concerned with her husband who has advanced esophageal cancer and is about to start another round of chemo.  They have moved to [place] to live near her son.  Denies CP, SOB, palpitations, presyncope, syncope or other concerns.   Past Medical History:  Diagnosis Date  . Asymmetric septal hypertrophy (HCC)   . GERD (gastroesophageal reflux disease)   . H/O: rheumatic fever   . Hyperlipidemia   . Mitral valve prolapse   . Seizure ([place])   . SVT (supraventricular tachycardia) (HCC)    documented short RP tachycardia, previously adenosine sensitive    Past Surgical History:  Procedure Laterality Date  . ABDOMINAL HYSTERECTOMY     Current Outpatient Prescriptions  Medication Sig Dispense Refill  . aspirin 325 MG tablet Take 325 mg by mouth as directed.    Marland Kitchen aspirin EC 81 MG tablet Take 81 mg by mouth as directed. Alternates with one 325 mg tab once daily due to HA    . Coenzyme Q10 (COQ-10) 100 MG CAPS Take 1 capsule by mouth daily.    Marland Kitchen diltiazem (CARDIZEM CD) 120 MG 24 hr capsule Take 1 capsule (120 mg total) by mouth daily. 90 capsule 3  . diltiazem (CARDIZEM) 60 MG tablet TAKE 1-2 TABLETS DAILY AS NEEDED FOR PALPITATIONS 30 tablet 2  . levETIRAcetam (KEPPRA) 500 MG tablet Take 500 mg by mouth Twice daily.    . rosuvastatin (CRESTOR) 20 MG tablet Take 20 mg by mouth daily. Take at bedtime    . vitamin B-12 (CYANOCOBALAMIN) 500 MCG tablet Take 500 mcg by mouth daily.     No current facility-administered medications for this visit.     Allergies:   Sulfa antibiotics;  Amoxicillin; Codeine; Cortisone; Nitrofurantoin; and Sulfonamide derivatives    Social History:  The patient  reports that she has never smoked. She has never used smokeless tobacco. She reports that she does not drink alcohol or use drugs.   Family History:  The patient's family history includes Heart attack in her mother.    ROS:  Please see the history of present illness.   Otherwise, review of systems are positive for none.   All other systems are reviewed and negative.    PHYSICAL EXAM: VS:  BP 122/66   Pulse 68   Ht 5' 5.5" (1.664 m)   Wt 156 lb (70.8 kg)   SpO2 96%   BMI 25.56 kg/m  , BMI Body mass index is 25.56 kg/m. GEN: Well nourished, well developed, in no acute distress  HEENT: normal  Neck: no JVD, carotid bruits, or masses Cardiac: RRR; 2/6 murmur, no rubs, or gallops,no edema  Respiratory:  clear to auscultation bilaterally, normal work of breathing GI: soft, nontender, nondistended, + BS MS: age appropriate atrophy  Skin: warm and dry, no rash Neuro:  Strength and sensation are intact Psych: euthymic mood, full affect   EKG:  EKG is ordered today. The ekg ordered today demonstrates SR 68 bpm, PR interval 192 msec, incomplete  RBBB, nonspecific ST/T changes   Recent Labs: 05/26/2016: BUN 12; Creatinine, Ser 1.02; Hemoglobin 14.8; Platelets 262; Potassium 4.2; Sodium 141    Lipid Panel No results found for: CHOL, TRIG, HDL, CHOLHDL, VLDL, LDLCALC, LDLDIRECT    Wt Readings from Last 3 Encounters:  02/24/17 156 lb (70.8 kg)  02/24/16 158 lb 3.2 oz (71.8 kg)  12/13/14 169 lb (76.7 kg)     ASSESSMENT AND PLAN:  1. SVT Adenosine sensitive short RP SVT.  EKG from 7/18 reviewed Therapeutic strategies for supraventricular tachycardia including medicine and ablation were discussed in detail with the patient today. Risk, benefits, and alternatives to EP study and radiofrequency ablation were also discussed in detail today.  At this time, she continues to wish  to avoid ablation.  Given her husband's illness, she states "I cannot do that right now". Continue daily cardizem CD with prn short-acting Cardizem 60 mg to take when she gets palpitations If the Cardizem does not work, consider pill-in-pocket Flecainide for breakthrough episodes  2. Cataracts Ok to proceed with surgery without further CV testing  3. HL Stable No change required today  Disposition:   FU with me in 1 year or as needed.   Signed, Thompson Grayer, MD  02/24/2017 2:17 PM    [facility] [place], [place], [place]  [address] Phone: [phone]; Fax: [phone]

## 2017-04-02 ENCOUNTER — Other Ambulatory Visit: Payer: Self-pay | Admitting: Internal Medicine

## 2017-12-09 ENCOUNTER — Emergency Department (HOSPITAL_COMMUNITY): Payer: Medicare Other

## 2017-12-09 ENCOUNTER — Emergency Department (HOSPITAL_COMMUNITY)
Admission: EM | Admit: 2017-12-09 | Discharge: 2017-12-09 | Disposition: A | Discharge status: Home / Self Care | Payer: Medicare Other | Attending: Emergency Medicine | Admitting: Emergency Medicine

## 2017-12-09 ENCOUNTER — Encounter (HOSPITAL_COMMUNITY): Payer: Self-pay | Admitting: Emergency Medicine

## 2017-12-09 DIAGNOSIS — I4891 Unspecified atrial fibrillation: Secondary | ICD-10-CM | POA: Diagnosis not present

## 2017-12-09 DIAGNOSIS — R000 Tachycardia, unspecified: Secondary | ICD-10-CM | POA: Diagnosis not present

## 2017-12-09 DIAGNOSIS — Z7982 Long term (current) use of aspirin: Secondary | ICD-10-CM | POA: Diagnosis not present

## 2017-12-09 DIAGNOSIS — I10 Essential (primary) hypertension: Secondary | ICD-10-CM | POA: Insufficient documentation

## 2017-12-09 DIAGNOSIS — R300 Dysuria: Secondary | ICD-10-CM

## 2017-12-09 DIAGNOSIS — Z79899 Other long term (current) drug therapy: Secondary | ICD-10-CM | POA: Diagnosis not present

## 2017-12-09 DIAGNOSIS — E785 Hyperlipidemia, unspecified: Secondary | ICD-10-CM | POA: Insufficient documentation

## 2017-12-09 DIAGNOSIS — R002 Palpitations: Secondary | ICD-10-CM

## 2017-12-09 HISTORY — DX: Unspecified atrial fibrillation: I48.91

## 2017-12-09 LAB — URINALYSIS, ROUTINE W REFLEX MICROSCOPIC
Bilirubin Urine: NEGATIVE
GLUCOSE, UA: NEGATIVE mg/dL
HGB URINE DIPSTICK: NEGATIVE
Ketones, ur: NEGATIVE mg/dL
Leukocytes, UA: NEGATIVE
Nitrite: NEGATIVE
PH: 6 (ref 5.0–8.0)
PROTEIN: NEGATIVE mg/dL
Specific Gravity, Urine: 1.014 (ref 1.005–1.030)

## 2017-12-09 LAB — BASIC METABOLIC PANEL
Anion gap: 10 (ref 5–15)
BUN: 15 mg/dL (ref 6–20)
CHLORIDE: 108 mmol/L (ref 101–111)
CO2: 24 mmol/L (ref 22–32)
CREATININE: 0.87 mg/dL (ref 0.44–1.00)
Calcium: 10 mg/dL (ref 8.9–10.3)
GFR calc Af Amer: >60 mL/min (ref >60)
GFR calc non Af Amer: 59 mL/min — ABNORMAL LOW (ref >60)
Glucose, Bld: 129 mg/dL — ABNORMAL HIGH (ref 65–99)
Potassium: 4 mmol/L (ref 3.5–5.1)
Sodium: 142 mmol/L (ref 135–145)

## 2017-12-09 LAB — I-STAT TROPONIN, ED: Troponin i, poc: 0.04 ng/mL (ref 0.00–0.08)

## 2017-12-09 LAB — CBC
HCT: 46.7 % — ABNORMAL HIGH (ref 36.0–46.0)
Hemoglobin: 15.5 g/dL — ABNORMAL HIGH (ref 12.0–15.0)
MCH: 30.9 pg (ref 26.0–34.0)
MCHC: 33.2 g/dL (ref 30.0–36.0)
MCV: 93.2 fL (ref 78.0–100.0)
PLATELETS: 252 10*3/uL (ref 150–400)
RBC: 5.01 MIL/uL (ref 3.87–5.11)
RDW: 12.8 % (ref 11.5–15.5)
WBC: 6.6 10*3/uL (ref 4.0–10.5)

## 2017-12-09 MED ORDER — CEPHALEXIN 500 MG PO CAPS: 500.0000 mg | ORAL_CAPSULE | Freq: Two times a day (BID) | ORAL | 0 refills | Status: AC

## 2017-12-09 NOTE — ED Provider Notes (Signed)
[facility] EMERGENCY DEPARTMENT Provider Note   CSN: [id number] Arrival date & time: 12/09/17  1224     History   Chief Complaint Chief Complaint  Patient presents with  . Atrial Fibrillation    HPI Carla Flowers is a 84 y.o. female presenting for evaluation of tachycardia.  Patient states she has a history of A. fib, and this morning, she had tachycardia.  Heart rate was in the 180s.  Blood pressure elevated in the 160s over upper 90s.  She tried Valsalva maneuvers and taking an extra half of diltiazem without improvement of heart rate.  Upon arrival to the ER, her heart rate had improved, and continued to improve throughout her stay.  She reports over the past several days, she has noticed that her heart rate is more irregular.  She denies fevers, chills, cough, SOB, CP, N/V, abd pain, or abnormal BMS. Pt state she has had 2 days of suprapubic pressure, urinary frequency, and dysuria, denies hematuria. She reports that when she has a UTI, she often goes into A Fib. She is on ASA, no blood thinners. She follows with Dr. [name] from cardiology.   HPI  Past Medical History:  Diagnosis Date  . A-fib ([place])   . Asymmetric septal hypertrophy (HCC)   . GERD (gastroesophageal reflux disease)   . H/O: rheumatic fever   . Hyperlipidemia   . Mitral valve prolapse   . Seizure ([place])   . SVT (supraventricular tachycardia) (HCC)    documented short RP tachycardia, previously adenosine sensitive    Patient Active Problem List   Diagnosis Date Noted  . Shortness of breath 10/02/2013  . Chest pain 10/02/2013  . Hyperlipidemia 05/07/2013  . 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 HYPERTENSION, BENIGN 11/17/2010  . PSVT 11/17/2010  . 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 PALPITATIONS 10/01/2009    Past Surgical History:  Procedure Laterality Date  . ABDOMINAL HYSTERECTOMY      OB History    No data available       Home  Medications    Prior to Admission medications   Medication Sig Start Date End Date Taking? Authorizing Provider  aspirin 325 MG tablet Take 325 mg by mouth as directed.    [provider]  aspirin EC 81 MG tablet Take 81 mg by mouth as directed. Alternates with one 325 mg tab once daily due to HA    [provider]  cephALEXin (KEFLEX) 500 MG capsule Take 1 capsule (500 mg total) by mouth 2 (two) times daily for 3 days. 12/09/17 12/12/17  Dijuan Sleeth, PA-C  Coenzyme Q10 (COQ-10) 100 MG CAPS Take 1 capsule by mouth daily.    [provider]  diltiazem (CARDIZEM CD) 120 MG 24 hr capsule take 1 capsule by mouth once daily 04/02/17   Allred, Jeneen Rinks, MD  diltiazem (CARDIZEM) 60 MG tablet TAKE 1-2 TABLETS DAILY AS NEEDED FOR PALPITATIONS 09/30/16   Allred, Jeneen Rinks, MD  levETIRAcetam (KEPPRA) 500 MG tablet Take 500 mg by mouth Twice daily. 12/10/11   [provider]  rosuvastatin (CRESTOR) 20 MG tablet Take 20 mg by mouth daily. Take at bedtime    [provider]  vitamin B-12 (CYANOCOBALAMIN) 500 MCG tablet Take 500 mcg by mouth daily.    [provider]    Family History Family History  Problem Relation Age of Onset  . Heart attack Mother     Social History Social History   Tobacco Use  .  Smoking status: Never Smoker  . Smokeless tobacco: Never Used  Substance Use Topics  . Alcohol use: No  . Drug use: No     Allergies   Sulfa antibiotics; Amoxicillin; Codeine; Cortisone; Nitrofurantoin; and Sulfonamide derivatives   Review of Systems Review of Systems  Cardiovascular: Positive for palpitations.  Genitourinary: Positive for dysuria and frequency.  All other systems reviewed and are negative.    Physical Exam Updated Vital Signs BP 122/71 (BP Location: Left Arm)   Pulse 66   Temp 97.7 F (36.5 C) (Oral)   Resp 18   SpO2 95%   Physical Exam  Constitutional: She is oriented to person, place, and time. She appears  well-developed and well-nourished. No distress.  HENT:  Head: Normocephalic and atraumatic.  Eyes: Conjunctivae and EOM are normal. Pupils are equal, round, and reactive to light.  Neck: Normal range of motion. Neck supple.  Cardiovascular: Normal rate and intact distal pulses. An irregularly irregular rhythm present.  Pulmonary/Chest: Effort normal and breath sounds normal. No respiratory distress. She has no wheezes.  Abdominal: Soft. She exhibits no distension and no mass. There is no tenderness. There is no guarding.  Musculoskeletal: Normal range of motion.  Neurological: She is alert and oriented to person, place, and time.  Skin: Skin is warm and dry.  Psychiatric: She has a normal mood and affect.  Nursing note and vitals reviewed.    ED Treatments / Results  Labs (all labs ordered are listed, but only abnormal results are displayed) Labs Reviewed  BASIC METABOLIC PANEL - Abnormal; Notable for the following components:      Result Value   Glucose, Bld 129 (*)    GFR calc non Af Amer 59 (*)    All other components within normal limits  CBC - Abnormal; Notable for the following components:   Hemoglobin 15.5 (*)    HCT 46.7 (*)    All other components within normal limits  URINE CULTURE  URINALYSIS, ROUTINE W REFLEX MICROSCOPIC  I-STAT TROPONIN, ED    EKG  EKG Interpretation  Date/Time:  Thursday December 09 2017 20:00:04 EST Ventricular Rate:  68 PR Interval:  206 QRS Duration: 102 QT Interval:  414 QTC Calculation: 440 R Axis:   2 Text Interpretation:  Sinus rhythm with Premature supraventricular complexes Left ventricular hypertrophy with repolarization abnormality Abnormal ECG ST/T changes similar to July 2017 Confirmed by Sherwood Gambler [phone]) on 12/09/2017 8:25:10 PM       Radiology Dg Chest 2 View  Result Date: 12/09/2017 CLINICAL DATA:  Palpitations.  History of atrial fibrillation. EXAM: CHEST  2 VIEW COMPARISON:  Chest x-ray dated May 26, 2016.  FINDINGS: Stable mild cardiomegaly. Normal pulmonary vascularity. No focal consolidation, pleural effusion, or pneumothorax. No acute osseous abnormality. Unchanged mild superior endplate compression deformity of an upper thoracic vertebral body. IMPRESSION: No active cardiopulmonary disease. Electronically Signed   By: Titus Dubin M.D.   On: 12/09/2017 14:23    Procedures Procedures (including critical care time)  Medications Ordered in ED Medications - No data to display   Initial Impression / Assessment and Plan / ED Course  I have reviewed the triage vital signs and the nursing notes.  Pertinent labs & imaging results that were available during my care of the patient were reviewed by me and considered in my medical decision making (see chart for details).     Patient presenting for evaluation of tachycardia and irregular heartbeat.  Physical exam reassuring, patient is no longer tachycardic.  Heart rate is irregularly regular, history of A. fib.  Patient without chest pain or shortness of breath, no signs of respiratory distress.  Labs reassuring, no leukocytosis.  UA negative.  Urine culture pending.  I-STAT troponin ordered.  Initial EKG tachycardic, will obtain repeat as patient heart rate has slowed.   Troponin negative.  EKG shows irregular rhythm. CHA2DS2-VASc score of 3.  Discussed with patient, discussed importance of follow-up with cardiology for further evaluation of heart rate, rhythm, and possible need for anticoagulation.  As patient has symptoms of UTI, and history of frequent UTIs, will give short course of abx. Case discussed with attending, Dr. Regenia Skeeter agrees to plan.  At this time, doubt ACS, PE, pneumonia, endocarditis, or infection as cause for initial heart rate.  Patient appears safe for discharge.  Encourage close follow-up with cardiology.  Return precautions given.  Patient states she understands and agrees to plan.   Final Clinical Impressions(s) / ED  Diagnoses   Final diagnoses:  Palpitations  Tachycardia  Dysuria    ED Discharge Orders        Ordered    cephALEXin (KEFLEX) 500 MG capsule  2 times daily     12/09/17 2042       Franchot Heidelberg, PA-C 12/09/17 2154    Sherwood Gambler, MD 12/10/17 2218

## 2017-12-09 NOTE — ED Triage Notes (Signed)
Patient to ED from home c/o palpitations beginning at 10am this morning. Patient has hx A-Fib and takes diltiazem - took an extra half-pill PTA. A-Fib with rate b/w 70-150. Patient denies CP/SOB, no lightheadedness. Resp e/u, skin warm/dry.

## 2017-12-09 NOTE — ED Notes (Addendum)
Pt was a little dizz when coming out to sit in lobby. However pt is not complaining of dizz at this time. Pt also did not complain of SOB or CP at this time

## 2017-12-09 NOTE — Discharge Instructions (Signed)
It is important that you follow up with your cardiologist. Call them tomorrow to set up and appointment.  Continue taking all your medications as prescribed.  Return to the ER if you develop chest pain, difficulty breathing, persistent high heart rat,e or any new or worsening symptoms.

## 2017-12-10 LAB — URINE CULTURE: CULTURE: NO GROWTH

## 2018-01-03 ENCOUNTER — Other Ambulatory Visit: Payer: Self-pay | Admitting: Internal Medicine

## 2018-01-05 ENCOUNTER — Telehealth: Payer: Self-pay | Admitting: Internal Medicine

## 2018-01-05 NOTE — Telephone Encounter (Signed)
Carla Flowers is calling to see if its okay for her to have Cataract Surgery on March the 18, 2019.She is inquiring on her own ,there was no request from her doctor for a clearance . Please call   Thanks

## 2018-01-05 NOTE — Telephone Encounter (Signed)
Spoke with Pt.  Pt with upcoming cataract surgery, wants to know if Dr. [name] would approve cataract surgery.  Wants Dr. [name] to see hospital notes from Pt February admission to ED.   Dr. [name] reviewed Pt admission note.  Dr. [name] suggests Pt follow up with him or afib clinic soon for f/u.  Notified Pt.  Pt asks if there are any available appts with Dr. [name] before her scheduled cataract surgery.  Will give to scheduling to work Pt in on January 12, 2018.

## 2018-01-12 ENCOUNTER — Encounter: Payer: Self-pay | Admitting: Internal Medicine

## 2018-01-12 ENCOUNTER — Ambulatory Visit: Payer: Medicare Other | Admitting: Internal Medicine

## 2018-01-12 VITALS — BP 124/72 | HR 67 | Ht 65.5 in | Wt 160.0 lb

## 2018-01-12 DIAGNOSIS — I480 Paroxysmal atrial fibrillation: Secondary | ICD-10-CM | POA: Diagnosis not present

## 2018-01-12 DIAGNOSIS — I471 Supraventricular tachycardia: Secondary | ICD-10-CM | POA: Diagnosis not present

## 2018-01-12 NOTE — Patient Instructions (Addendum)
Medication Instructions:  STOP Aspirin 325 mg   Labwork: NONE   Follow-Up: Your physician wants you to follow-up in: [place] will receive a reminder letter in the mail two months in advance. If you don't receive a letter, please call our office to schedule the follow-up appointment.  Any Other Special Instructions Will Be Listed Below (If Applicable).  If you need a refill on your cardiac medications before your next appointment, please call your pharmacy.

## 2018-01-12 NOTE — Progress Notes (Signed)
   PCP: [name], MD  Primary EP: Dr Corbin Ade is a 84 y.o. female who presents today for routine electrophysiology followup.  Since last being seen in our clinic, the patient reports doing reasonably well.  She was in the ED with afib in 2023-01-20.  Her husband died of esophageal cancer in 08/21/2023.  Today, she denies symptoms of palpitations, chest pain, shortness of breath,  lower extremity edema, dizziness, presyncope, or syncope.  The patient is otherwise without complaint today.   Past Medical History:  Diagnosis Date  . A-fib ([place])   . Asymmetric septal hypertrophy (HCC)   . GERD (gastroesophageal reflux disease)   . H/O: rheumatic fever   . Hyperlipidemia   . Mitral valve prolapse   . Seizure ([place])   . SVT (supraventricular tachycardia) (HCC)    documented short RP tachycardia, previously adenosine sensitive   Past Surgical History:  Procedure Laterality Date  . ABDOMINAL HYSTERECTOMY      ROS- all systems are reviewed and negatives except as per HPI above  Current Outpatient Medications  Medication Sig Dispense Refill  . aspirin 325 MG tablet Take 325 mg by mouth as directed.    Marland Kitchen aspirin EC 81 MG tablet Take 81 mg by mouth as directed. Alternates with one 325 mg tab once daily due to HA    . Coenzyme Q10 (COQ-10) 100 MG CAPS Take 1 capsule by mouth daily.    Marland Kitchen diltiazem (CARDIZEM) 60 MG tablet TAKE 1-2 TABLETS DAILY AS NEEDED FOR PALPITATIONS 30 tablet 2  . diltiazem (TIAZAC) 120 MG 24 hr capsule Take 1 capsule (120 mg total) by mouth daily. 90 capsule 0  . levETIRAcetam (KEPPRA) 500 MG tablet Take 500 mg by mouth Twice daily.    . rosuvastatin (CRESTOR) 20 MG tablet Take 10 mg by mouth every other day. Take at bedtime     . vitamin B-12 (CYANOCOBALAMIN) 500 MCG tablet Take 500 mcg by mouth daily.     No current facility-administered medications for this visit.     Physical Exam: Vitals:   01/12/18 0853  BP: 124/72  Pulse: 67  Weight: 160  lb (72.6 kg)  Height: 5' 5.5" (1.664 m)    GEN- The patient is well appearing, alert and oriented x 3 today.   Head- normocephalic, atraumatic Eyes-  Sclera clear, conjunctiva pink Ears- hearing intact Oropharynx- clear Lungs- Clear to ausculation bilaterally, normal work of breathing Heart- Regular rate and rhythm, no murmurs, rubs or gallops, PMI not laterally displaced GI- soft, NT, ND, + BS Extremities- no clubbing, cyanosis, or edema  EKG tracing ordered today is personally reviewed and shows sinus rhythm with premature supraventricular complexes, LVH with repolarization abnormality  Assessment and Plan:  1. Previously documented adenosine sensitive short RP SVT (documented 7/17) Seems to be well controlled  2. Paroxysmal atrial fibrillation Presented 12/09/17 with afib (ED notes and ekgs reviewed) On ASA.  chads2vasc score is 3.  We discussed anticoagulation today.  She would prefer to continue ASA for now Does not feel that AF burden is high enough to warrant AAD therapy.  Would add flecainide 50mg  BID should her afib increase  Return to see EP PA in 3 months  Thompson Grayer MD, [facility] 01/12/2018 9:08 AM

## 2018-02-24 ENCOUNTER — Telehealth: Payer: Self-pay | Admitting: Internal Medicine

## 2018-02-24 NOTE — Telephone Encounter (Signed)
New message    Pt c/o medication issue:  1. Name of Medication: Eliquis  2. How are you currently taking this medication (dosage and times per day)? n/a  3. Are you having a reaction (difficulty breathing--STAT)? n/a 4. What is your medication issue?  Patient requesting prescription for Eliquis, states Dr [name] mentioned taking medication after her eye surgery had been done. Patient also has questions about taking Aspirin

## 2018-02-24 NOTE — Telephone Encounter (Signed)
Pt states Dr. [name] wanted her to take Eliquis after cataract surgery.  She had cataract surgery (2nd eye) 2 weeks ago.   She also wants to know if she would continue aspirin when she start Eliquis.  Pt aware Dr. [name] is not in the office and that I will send message to him and his nurse and she will receive a call back with recommendations early next week.

## 2018-03-02 NOTE — Telephone Encounter (Signed)
Would stop ASA and then start eliquis 5mg  BID

## 2018-03-03 MED ORDER — APIXABAN 5 MG PO TABS: 5.0000 mg | ORAL_TABLET | Freq: Two times a day (BID) | ORAL | 11 refills | Status: DC

## 2018-03-03 NOTE — Telephone Encounter (Signed)
Call returned to Pt.  Advised Pt to start Eliquis 5 mg one tablet by mouth twice a day.  Notified to stop ASA after she starts Eliquis.  Pt has dermatologist appt on 03/08/2018 and believes she may need something removed.  Pt will wait to start Eliquis until after that appt.  Pt will pick up new start samples on Mar 08, 2018. No further needs at this time.

## 2018-03-09 ENCOUNTER — Telehealth: Payer: Self-pay | Admitting: Internal Medicine

## 2018-03-09 NOTE — Telephone Encounter (Signed)
Pt c/o medication issue:  1. Name of Medication:   apixaban (ELIQUIS) 5 MG TABS tablet .   2. How are you currently taking this medication (dosage and times per day)?  Take 1 tablet (5 mg total) by mouth 2 (two) times daily  3. Are you having a reaction (difficulty breathing--STAT)?  no  4. What is your medication issue?  Pt Dr.Wentworth at [facility] dermatology put pt on a lip cream, Fluorouracil she want to know should she start the Eliquis or wait until she is done with the cream  Will the medications cause a drug interaction?

## 2018-03-10 NOTE — Telephone Encounter (Signed)
Returned call to Pt.  Notified Pt ok to use medication prescribed by dermatologist topically with Eliquis. Pt indicates understanding.

## 2018-03-19 ENCOUNTER — Other Ambulatory Visit: Payer: Self-pay | Admitting: Internal Medicine

## 2018-11-23 ENCOUNTER — Telehealth: Payer: Self-pay | Admitting: Internal Medicine

## 2018-11-23 NOTE — Telephone Encounter (Signed)
  Patient is calling because Carla Flowers wanted to make Dr [name] aware that Carla Flowers is having an episode of SVT's and is going to the ER.

## 2018-11-24 NOTE — Telephone Encounter (Signed)
Please schedule a follow-up visit with me.  Not urgent.

## 2018-11-29 NOTE — Telephone Encounter (Signed)
Call placed to Pt by scheduling.  Pt refused f/u appt at this time.  Pt states she will call office to schedule at her convenience.

## 2018-12-14 ENCOUNTER — Other Ambulatory Visit: Payer: Self-pay | Admitting: Internal Medicine

## 2019-01-16 ENCOUNTER — Encounter: Payer: Self-pay | Admitting: Internal Medicine

## 2019-01-25 ENCOUNTER — Ambulatory Visit: Payer: Medicare Other | Admitting: Internal Medicine

## 2019-02-16 ENCOUNTER — Telehealth: Payer: Self-pay

## 2019-02-16 ENCOUNTER — Telehealth: Payer: Self-pay | Admitting: Internal Medicine

## 2019-02-16 NOTE — Telephone Encounter (Signed)
Spoke with pt regarding appt on 02/17/19. Pt stated she will check vitals prior to appt. Pt questions and concerns were address.

## 2019-02-16 NOTE — Telephone Encounter (Signed)
Returned call to DIL.  Pt took her long acting diltiazem 120 mg this am.    Then when she went into SVT she took an additional 30 mg of short acting diltiazem at around 12.  Her pulse continues to run about 115 at this time.  Pt refused to go by ambulance to hospital.  Advised Pt to take a whole tablet of her short acting diltiazem 60 mg right now.  Will check back on family in an hour to see if this has helped.  Also scheduled Pt and daughter with virtual visit tomorrow with Dr. [name].  Will advise Dr. [name] and return call to family with any further advisement.

## 2019-02-16 NOTE — Telephone Encounter (Signed)
Spoke with the pt and her daughter, Barnett Applebaum,  They had EMS there this morning because the pt felt her heart racing... they said they did an EKG and told them it was abnormal... her HR was 147 and BP 136/77... she says they wanted to take her to the ER but she declined because she was afraid to go with COVID19 fear... now her BP is the same but her HR is 118.. she says has had a total of 180mg  of Diltiazem... she denies dizziness, sob, chest pain... just feels anxious about her HR being elevated.   Advised her I will forward to Jenny/ Dr. [name] for advice.. pt tried to cough hard earlier but it did not give her relief... I asked her to try and relax and we will call her back with a recommendation.   Will call EMS to try and get copy of EKG.

## 2019-02-16 NOTE — Telephone Encounter (Signed)
DIL called. Pt had EMS come to the house this morning because her BP and HR were high. EMS did an ECG and the results came back abnormal. Pt did not want to go to the ED because of the virus. DIL says at time of call, BP 136/77 HR 147 and her HR is abnormally high. In the past when this happens, the pt is given a shot to bring her HR down. She has also been told to take an extra 1/2 dose of her medication. She has done that too.  Pt wants to know if she can be checked out at the office without going to the hospital. DIL says this has been happening frequently over the past month, but for some reason today it doesn't seem to be going down.

## 2019-02-16 NOTE — Telephone Encounter (Signed)
Returned call to family.  Pt heart rate is now down into the 60's and patient is feeling better.  Clarified short term diltiazem instructions 60 mg 1-2 tablets as needed daily.  Daughter indicates understanding.  Pt with f/u with Dr. [name] tomorrow.  Advised to keep appt.

## 2019-02-17 ENCOUNTER — Telehealth (INDEPENDENT_AMBULATORY_CARE_PROVIDER_SITE_OTHER): Payer: Medicare Other | Admitting: Internal Medicine

## 2019-02-17 VITALS — BP 135/67 | HR 62 | Wt 158.0 lb

## 2019-02-17 DIAGNOSIS — I471 Supraventricular tachycardia: Secondary | ICD-10-CM | POA: Diagnosis not present

## 2019-02-17 DIAGNOSIS — I480 Paroxysmal atrial fibrillation: Secondary | ICD-10-CM | POA: Diagnosis not present

## 2019-02-17 NOTE — Progress Notes (Signed)
Electrophysiology TeleHealth Note   Due to national recommendations of social distancing due to COVID 19, an audio/video telehealth visit is felt to be most appropriate for this patient at this time.  See MyChart message from today for the patient's consent to telehealth for Carla Flowers.   Date:  02/17/2019   ID:  Carla Flowers, DOB [birth year], MRN [id number]  Location: patient's home  Provider location: [address], [place]  Evaluation Performed: Follow-up visit  PCP:  [name], MD   Electrophysiologist:  Dr [name]  Chief Complaint:  palpitations  History of Present Illness:    Carla Flowers is a 85 y.o. female who presents via audio/video conferencing for a telehealth visit today.  Since last being seen in our clinic, the patient reports doing very well.  She had abrupt onset tachypalpitations yesterday.  EMS was called.  By the time that they arrives, she was in sinus rhythm.  She feels washout out today.  She has had several episodes over the past few months which were similar.  She has waterbrash/ reflux and things that this may be at times a trigger for her events. Today, she denies symptoms of palpitations, chest pain, shortness of breath,  lower extremity edema, dizziness, presyncope, or syncope.  The patient is otherwise without complaint today.  The patient denies symptoms of fevers, chills, cough, or new SOB worrisome for COVID 19.  Past Medical History:  Diagnosis Date  . A-fib (Carla Flowers)   . Asymmetric septal hypertrophy (Carla Flowers)   . GERD (gastroesophageal reflux disease)   . H/O: rheumatic fever   . Hyperlipidemia   . Mitral valve prolapse   . Seizure (Carla Flowers)   . SVT (supraventricular tachycardia) (Carla Flowers)    documented short RP tachycardia, previously adenosine sensitive    Past Surgical History:  Procedure Laterality Date  . ABDOMINAL HYSTERECTOMY      Current Outpatient Medications  Medication Sig Dispense Refill  . apixaban  (ELIQUIS) 5 MG TABS tablet Take 1 tablet (5 mg total) by mouth 2 (two) times daily. 60 tablet 11  . Coenzyme Q10 (COQ-10) 100 MG CAPS Take 1 capsule by mouth daily.    Marland Kitchen diltiazem (CARDIZEM) 60 MG tablet TAKE 1-2 TABLETS DAILY AS NEEDED FOR PALPITATIONS 30 tablet 2  . diltiazem (TIAZAC) 120 MG 24 hr capsule Take 1 capsule (120 mg total) by mouth daily. 90 capsule 0  . levETIRAcetam (KEPPRA) 500 MG tablet Take 500 mg by mouth Twice daily.    . rosuvastatin (CRESTOR) 20 MG tablet Take 10 mg by mouth every other day. Take at bedtime     . vitamin B-12 (CYANOCOBALAMIN) 500 MCG tablet Take 500 mcg by mouth daily.     No current facility-administered medications for this visit.     Allergies:   Sulfa antibiotics; Amoxicillin; Codeine; Cortisone; Nitrofurantoin; and Sulfonamide derivatives   Social History:  The patient  reports that she has never smoked. She has never used smokeless tobacco. She reports that she does not drink alcohol or use drugs.   Family History:  The patient's  family history includes Heart attack in her mother.   ROS:  Please see the history of present illness.   All other systems are personally reviewed and negative.    Exam:    Vital Signs:  BP 135/67   Pulse 62   Wt 158 lb (71.7 kg)   BMI 25.89 kg/m   Well appearing, alert and conversant, regular work of breathing,  good skin color Eyes- anicteric, neuro- grossly intact, skin- no apparent rash or lesions or cyanosis, mouth- oral mucosa is pink   Labs/Other Tests and Data Reviewed:    Recent Labs: No results found for requested labs within last 8760 hours.   Wt Readings from Last 3 Encounters:  02/17/19 158 lb (71.7 kg)  01/12/18 160 lb (72.6 kg)  02/24/17 156 lb (70.8 kg)     Other studies personally reviewed: Additional studies/ records that were reviewed today include: ekg from ems (in epic media), my prior notes, recent phone notes  Review of the above records today demonstrates: as above     ASSESSMENT & PLAN:    1.  SVT (short RP) and paroxysmal atrial fibrillation She wishes to continue her current approach and use additional diltiazem prn I have offered increasing diltiazem CD to 180mg  daily I have also offered flecainide 50mg  BID She is on eliquis  3. COVID 19 screen The patient denies symptoms of COVID 19 at this time.  The importance of social distancing was discussed today.  Follow-up with me with telehealth visit in 4 weeks  Current medicines are reviewed at length with the patient today.   The patient does not have concerns regarding her medicines.  The following changes were made today:  none  Labs/ tests ordered today include:  No orders of the defined types were placed in this encounter.    Patient Risk:  after full review of this patients clinical status, I feel that they are at moderate to high risk at this time.  Today, I have spent 15 minutes with the patient with telehealth technology discussing palpitations .    Army Fossa, MD  02/17/2019 1:57 PM     Carla Flowers [place] [address] [phone] (office) [phone] (fax)

## 2019-03-11 ENCOUNTER — Other Ambulatory Visit: Payer: Self-pay | Admitting: Internal Medicine

## 2019-03-13 NOTE — Telephone Encounter (Signed)
Eliquis 5mg  refill request received; pt is 85 yrs old, wt-72.6kg, Crea-0.97 on 07/14/2018 via [place] at [facility], last seen by Dr. [name] virtually on 02/17/2019; refill sent.

## 2019-03-14 ENCOUNTER — Other Ambulatory Visit: Payer: Self-pay | Admitting: Internal Medicine

## 2019-03-16 ENCOUNTER — Telehealth: Payer: Self-pay

## 2019-03-16 NOTE — Telephone Encounter (Signed)
Spoke with pt regarding appt on 03/20/19. Pt stated she does not have any concerns about appt.

## 2019-03-20 ENCOUNTER — Encounter: Payer: Self-pay | Admitting: Internal Medicine

## 2019-03-20 ENCOUNTER — Telehealth (INDEPENDENT_AMBULATORY_CARE_PROVIDER_SITE_OTHER): Payer: Medicare Other | Admitting: Internal Medicine

## 2019-03-20 VITALS — BP 121/75 | HR 66 | Ht 65.5 in | Wt 159.0 lb

## 2019-03-20 DIAGNOSIS — I480 Paroxysmal atrial fibrillation: Secondary | ICD-10-CM

## 2019-03-20 DIAGNOSIS — I471 Supraventricular tachycardia: Secondary | ICD-10-CM

## 2019-03-20 NOTE — Progress Notes (Signed)
Electrophysiology TeleHealth Note   Due to national recommendations of social distancing due to COVID 19, an audio/video telehealth visit is felt to be most appropriate for this patient at this time.  See MyChart message from today for the patient's consent to telehealth for [facility].   Date:  03/20/2019   ID:  Carla Flowers, DOB [birth year], MRN [id number]  Location: patient's home  Provider location: [address] [place]  Evaluation Performed: Follow-up visit  PCP:  [name], MD   Electrophysiologist:  Dr [name]  Chief Complaint:  SVT  History of Present Illness:    Carla Flowers is a 85 y.o. female who presents via audio/video conferencing for a telehealth visit today.  Since last being seen in our clinic, the patient reports doing very well.  She is pleased that she has had no further SVT.  Today, she denies symptoms of palpitations, chest pain, shortness of breath,  lower extremity edema, dizziness, presyncope, or syncope.  The patient is otherwise without complaint today.  The patient denies symptoms of fevers, chills, cough, or new SOB worrisome for COVID 19.  Past Medical History:  Diagnosis Date  . A-fib ([place])   . Asymmetric septal hypertrophy ([facility])   . GERD (gastroesophageal reflux disease)   . H/O: rheumatic fever   . Hyperlipidemia   . Mitral valve prolapse   . Seizure ([place])   . SVT (supraventricular tachycardia) ([facility])    documented short RP tachycardia, previously adenosine sensitive    Past Surgical History:  Procedure Laterality Date  . ABDOMINAL HYSTERECTOMY      Current Outpatient Medications  Medication Sig Dispense Refill  . Coenzyme Q10 (COQ-10) 100 MG CAPS Take 1 capsule by mouth daily.    Marland Kitchen diltiazem (CARDIZEM) 60 MG tablet TAKE 1-2 TABLETS DAILY AS NEEDED FOR PALPITATIONS 30 tablet 2  . diltiazem (TIAZAC) 120 MG 24 hr capsule TAKE 1 CAPSULE(120 MG) BY MOUTH DAILY 90 capsule 0  . ELIQUIS 5 MG TABS tablet TAKE 1  TABLET(5 MG) BY MOUTH TWICE DAILY 60 tablet 5  . levETIRAcetam (KEPPRA) 500 MG tablet Take 500 mg by mouth Twice daily.    . pravastatin (PRAVACHOL) 40 MG tablet Take 40 mg by mouth daily.     No current facility-administered medications for this visit.     Allergies:   Sulfa antibiotics; Amoxicillin; Codeine; Cortisone; Nitrofurantoin; and Sulfonamide derivatives   Social History:  The patient  reports that she has never smoked. She has never used smokeless tobacco. She reports that she does not drink alcohol or use drugs.   Family History:  The patient's  family history includes Heart attack in her mother.   ROS:  Please see the history of present illness.   All other systems are personally reviewed and negative.    Exam:    Vital Signs:  BP 121/75   Pulse 66   Ht 5' 5.5" (1.664 m)   Wt 159 lb (72.1 kg)   BMI 26.06 kg/m   Well appearing, alert and conversant, regular work of breathing,  good skin color Eyes- anicteric, neuro- grossly intact, skin- no apparent rash or lesions or cyanosis, mouth- oral mucosa is pink   Labs/Other Tests and Data Reviewed:    Recent Labs: No results found for requested labs within last 8760 hours.   Wt Readings from Last 3 Encounters:  03/20/19 159 lb (72.1 kg)  02/17/19 158 lb (71.7 kg)  01/12/18 160 lb (72.6 kg)  Other studies personally reviewed: Additional studies/ records that were reviewed today include: my prior notes  Review of the above records today demonstrates: as above    ASSESSMENT & PLAN:    1.  SVT Short RP SVT, previously adenosine sensitive Improved I have offered flecainide previously which she desclines She is on eliquis  2. afib On eliquis Controlled  3. COVID 19 screen The patient denies symptoms of COVID 19 at this time.  The importance of social distancing was discussed today.  Follow-up:  3 months with me   Current medicines are reviewed at length with the patient today.   The patient does not  have concerns regarding her medicines.  The following changes were made today:  none  Labs/ tests ordered today include:  No orders of the defined types were placed in this encounter.  Patient Risk:  after full review of this patients clinical status, I feel that they are at moderate risk at this time.  Today, I have spent 15 minutes with the patient with telehealth technology discussing SVT .    Army Fossa, MD  03/20/2019 11:21 AM     [facility] [address] [place] [address] [phone] (office) [phone] (fax)

## 2019-04-26 ENCOUNTER — Other Ambulatory Visit: Payer: Self-pay | Admitting: Internal Medicine

## 2019-04-26 MED ORDER — DILTIAZEM HCL 60 MG PO TABS: ORAL_TABLET | ORAL | 6 refills | Status: DC

## 2019-04-26 NOTE — Telephone Encounter (Signed)
 *  STAT* If patient is at the pharmacy, call can be transferred to refill team.   1. Which medications need to be refilled? (please list name of each medication and dose if known) diltiazem (CARDIZEM) 60 MG tablet  2. Which pharmacy/location (including street and city if local pharmacy) is medication to be sent to? [address]  3. Do they need a 30 day or 90 day supply? [place]

## 2019-04-26 NOTE — Telephone Encounter (Signed)
Pt's medication was sent to pt's pharmacy as requested. Confirmation received.  °

## 2019-06-12 ENCOUNTER — Telehealth: Payer: Self-pay

## 2019-06-12 ENCOUNTER — Other Ambulatory Visit: Payer: Self-pay | Admitting: Internal Medicine

## 2019-06-12 NOTE — Telephone Encounter (Signed)
Spoke with pt regarding covid-19 screening prior to appt. Pt stated she has not been in contact with anyone who may have covid-19 and does not have any symptoms.

## 2019-06-14 ENCOUNTER — Ambulatory Visit (INDEPENDENT_AMBULATORY_CARE_PROVIDER_SITE_OTHER): Payer: Medicare Other | Admitting: Internal Medicine

## 2019-06-14 ENCOUNTER — Encounter: Payer: Self-pay | Admitting: Internal Medicine

## 2019-06-14 ENCOUNTER — Other Ambulatory Visit: Payer: Self-pay

## 2019-06-14 VITALS — BP 124/60 | HR 69 | Ht 65.5 in | Wt 160.6 lb

## 2019-06-14 DIAGNOSIS — I471 Supraventricular tachycardia: Secondary | ICD-10-CM | POA: Diagnosis not present

## 2019-06-14 DIAGNOSIS — I480 Paroxysmal atrial fibrillation: Secondary | ICD-10-CM | POA: Diagnosis not present

## 2019-06-14 NOTE — Progress Notes (Signed)
   PCP: [name], MD   Primary EP: Dr Corbin Ade is a 85 y.o. female who presents today for routine electrophysiology followup.  Since last being seen in our clinic, the patient reports doing reasonably well.  She has occasional tachypalpitations.  She takes cardizem short acting and typically finds that episodes will resolve.  She attributes recent episodes to GERD.  She plans to follow up with Dr Melford Aase about this.  Today, she denies symptoms of   chest pain, shortness of breath,  lower extremity edema, dizziness, presyncope, or syncope.  The patient is otherwise without complaint today.   Past Medical History:  Diagnosis Date  . A-fib ([place])   . Asymmetric septal hypertrophy (H[facility])   . GERD (gastroesophageal reflux disease)   . H/O: rheumatic fever   . Hyperlipidemia   . Mitral valve prolapse   . Seizure ([place])   . SVT (supraventricular tachycardia) ([facility])    documented short RP tachycardia, previously adenosine sensitive   Past Surgical History:  Procedure Laterality Date  . ABDOMINAL HYSTERECTOMY      ROS- all systems are reviewed and negatives except as per HPI above  Current Outpatient Medications  Medication Sig Dispense Refill  . Coenzyme Q10 (COQ-10) 100 MG CAPS Take 1 capsule by mouth daily.    Marland Kitchen diltiazem (CARDIZEM) 60 MG tablet TAKE 1-2 TABLETS DAILY AS NEEDED FOR PALPITATIONS 30 tablet 6  . diltiazem (TIAZAC) 120 MG 24 hr capsule TAKE 1 CAPSULE(120 MG) BY MOUTH DAILY 90 capsule 0  . ELIQUIS 5 MG TABS tablet TAKE 1 TABLET(5 MG) BY MOUTH TWICE DAILY 60 tablet 5  . levETIRAcetam (KEPPRA) 500 MG tablet Take 500 mg by mouth Twice daily.    . pravastatin (PRAVACHOL) 40 MG tablet Take 40 mg by mouth daily.     No current facility-administered medications for this visit.     Physical Exam: Vitals:   06/14/19 1506  BP: 124/60  Pulse: 69  SpO2: 95%  Weight: 160 lb 9.6 oz (72.8 kg)  Height: 5' 5.5" (1.664 m)    GEN- The patient is well appearing,  alert and oriented x 3 today.   Head- normocephalic, atraumatic Eyes-  Sclera clear, conjunctiva pink Ears- hearing intact Oropharynx- clear Lungs-   normal work of breathing Heart- Regular rate and rhythm  GI- soft, NT, ND, + BS Extremities- no clubbing, cyanosis, or edema  Wt Readings from Last 3 Encounters:  06/14/19 160 lb 9.6 oz (72.8 kg)  03/20/19 159 lb (72.1 kg)  02/17/19 158 lb (71.7 kg)    EKG tracing ordered today is personally reviewed and shows sinus  Assessment and Plan:  1. SVT Short RP, adenosine sensitive We discussed ablation again today.  She states "at my age I dont have any desire to do that".  I have also offered flecainide which she declines today. No changes  2. Paroxysmal atrial fibrillation On eliquis Controlled  Return to see EP PA every 6 months   Thompson Grayer MD, [facility] 06/14/2019 3:35 PM

## 2019-06-14 NOTE — Patient Instructions (Addendum)
Medication Instructions:  Your physician recommends that you continue on your current medications as directed. Please refer to the Current Medication list given to you today.  * If you need a refill on your cardiac medications before your next appointment, please call your pharmacy.   Labwork: None ordered  Testing/Procedures: None ordered  Follow-Up: Your physician wants you to follow-up in: 6 months with Tommye Standard, PA.  You will receive a reminder letter in the mail two months in advance. If you don't receive a letter, please call our office to schedule the follow-up appointment.  Thank you for choosing [facility]!!

## 2019-06-21 ENCOUNTER — Telehealth: Payer: Self-pay | Admitting: Internal Medicine

## 2019-06-21 NOTE — Telephone Encounter (Signed)
New Message   Patient is calling in to go over her EKG results. Please give patient a call back.

## 2019-06-22 ENCOUNTER — Telehealth: Payer: Self-pay | Admitting: Internal Medicine

## 2019-06-22 NOTE — Telephone Encounter (Signed)
New Message     Pt is calling because she said she has questions about an EKG she had done on the 12th. She would like for a nurse to call her back   Please call

## 2019-06-22 NOTE — Telephone Encounter (Signed)
Pt was confused regarding a message she received saying her EKG results were ready. I confirmed that the EKG showed normal sinus rhythm. Pt was grateful for the call.

## 2019-06-22 NOTE — Telephone Encounter (Signed)
Follow-up:   Patient is calling again to go over the results of her EKG. She saw her results on her MyChart, but does not understand what they mean.  Please discuss results with the patient.

## 2019-07-31 DIAGNOSIS — N183 Chronic kidney disease, stage 3 unspecified: Secondary | ICD-10-CM | POA: Insufficient documentation

## 2019-07-31 DIAGNOSIS — E538 Deficiency of other specified B group vitamins: Secondary | ICD-10-CM | POA: Insufficient documentation

## 2019-08-14 ENCOUNTER — Encounter: Payer: Self-pay | Admitting: Gastroenterology

## 2019-09-12 ENCOUNTER — Other Ambulatory Visit: Payer: Self-pay | Admitting: Internal Medicine

## 2019-09-12 NOTE — Telephone Encounter (Signed)
Pt last saw Dr [name] 06/14/19, last labs 07/31/19 Creat 1.0 per care everywhere, age 85, weight 72.8kg, based on specified criteria pt is on appropriate dosage of Eliquis 5mg  BID.  Will refill rx.

## 2019-09-14 ENCOUNTER — Ambulatory Visit: Payer: Medicare Other | Admitting: Gastroenterology

## 2019-09-21 ENCOUNTER — Other Ambulatory Visit: Payer: Self-pay | Admitting: Internal Medicine

## 2019-09-21 MED ORDER — DILTIAZEM HCL ER BEADS 120 MG PO CP24: ORAL_CAPSULE | ORAL | 2 refills | Status: DC

## 2019-10-18 ENCOUNTER — Ambulatory Visit: Payer: Medicare Other | Admitting: Gastroenterology

## 2019-11-13 ENCOUNTER — Ambulatory Visit: Payer: Medicare Other | Admitting: Gastroenterology

## 2019-11-13 ENCOUNTER — Other Ambulatory Visit: Payer: Self-pay

## 2019-11-13 ENCOUNTER — Encounter: Payer: Self-pay | Admitting: Gastroenterology

## 2019-11-13 DIAGNOSIS — R1310 Dysphagia, unspecified: Secondary | ICD-10-CM | POA: Diagnosis not present

## 2019-11-13 DIAGNOSIS — R1013 Epigastric pain: Secondary | ICD-10-CM

## 2019-11-13 DIAGNOSIS — R933 Abnormal findings on diagnostic imaging of other parts of digestive tract: Secondary | ICD-10-CM | POA: Diagnosis not present

## 2019-11-13 DIAGNOSIS — G8929 Other chronic pain: Secondary | ICD-10-CM | POA: Diagnosis not present

## 2019-11-13 MED ORDER — FAMOTIDINE 20 MG PO TABS: 20.0000 mg | ORAL_TABLET | Freq: Two times a day (BID) | ORAL | 3 refills | Status: DC

## 2019-11-13 NOTE — Patient Instructions (Addendum)
You have been scheduled for a CT scan of the abdomen and pelvis at [facility].  You are scheduled on 11/20/2019 at 1:30pm. You should arrive 15 minutes prior to your appointment time for registration. Please follow the written instructions below on the day of your exam:  WARNING: IF YOU ARE ALLERGIC TO IODINE/X-RAY DYE, PLEASE NOTIFY RADIOLOGY IMMEDIATELY AT [phone]. YOU WILL BE GIVEN A 13 HOUR PREMEDICATION PREP.  1) Do not eat or drink anything after 9:30am (4 hours prior to your test) 2) You have been given 2 bottles of oral contrast to drink. The solution may taste better if refrigerated, but do NOT add ice or any other liquid to this solution. Shake well before drinking.    Drink 1 bottle of contrast @ 11:30am (2 hours prior to your exam)  Drink 1 bottle of contrast @ 12:30pm (1 hour prior to your exam)  You may take any medications as prescribed with a small amount of water, if necessary. If you take any of the following medications: METFORMIN, GLUCOPHAGE, GLUCOVANCE, AVANDAMET, RIOMET, FORTAMET, [place] MET, JANUMET, GLUMETZA or METAGLIP, you MAY be asked to HOLD this medication 48 hours AFTER the exam.  The purpose of you drinking the oral contrast is to aid in the visualization of your intestinal tract. The contrast solution may cause some diarrhea. Depending on your individual set of symptoms, you may also receive an intravenous injection of x-ray contrast/dye. Plan on being at [facility] for 30 minutes or longer, depending on the type of exam you are having performed.  This test typically takes 30-45 minutes to complete.  If you have any questions regarding your exam or if you need to reschedule, you may call the CT department at [phone]. between the hours of 8:00 am and 5:00 pm, Monday-Friday.  _________________________________________________________  We have sent the following medications to your pharmacy for you to pick up at your  convenience:  Famotidine 20 mg one tablet twice daily  If you are age 25 or older, your body mass index should be between 23-30. Your Body mass index is 29.22 kg/m. If this is out of the aforementioned range listed, please consider follow up with your Primary Care Provider.  If you are age 40 or younger, your body mass index should be between 19-25. Your Body mass index is 29.22 kg/m. If this is out of the aformentioned range listed, please consider follow up with your Primary Care Provider.  Due to recent changes in healthcare laws, you may see the results of your imaging and laboratory studies on MyChart before your provider has had a chance to review them.  We understand that in some cases there may be results that are confusing or concerning to you. Not all laboratory results come back in the same time frame and the provider may be waiting for multiple results in order to interpret others.  Please give [place] 48 hours in order for your provider to thoroughly review all the results before contacting the office for clarification of your results.   Your provider has requested that you go to the basement level for lab work before leaving today. Press "B" on the elevator. The lab is located at the first door on the left as you exit the elevator.  Follow up with Dr Tarri Glenn on 12/11/2019 at 11:10am

## 2019-11-13 NOTE — Progress Notes (Signed)
Referring Provider: [name], MD Primary Care Physician:  [name], MD  Reason for Consultation: Dysphagia   IMPRESSION:  Epigastric pain with associated nausea x 2-3 months Reflux using PPI PRN, Tums more frequently Eructation Abnormal upper GI series Atrial fibrillation on Eliquis No known family of GI disease  Multiple GI issues. Unclear if they are related. In particular, her UGI series findings do not seem to explain her recent abdominal pain, nausea, and eructation.  Ideally, would proceed with EGD to evaluate for her epigastric pain and abnormal UGI series. If negative, proceed with esophageal manometry given her suspected esophageal dysmotility and cross-sectional abdominal imaging.  But, she does not want to pursue endoscopy.  Resistant to any testing or medications beyond x-rays due to risks.  She ultimately agreed to proceed with a CT scan.   Her reflux is inadequately controlled. Discussed options including a trial of prescription-strength famotidine. She agrees to try this. Does not want to continue with PPI.    PLAN: CT abd/pelvis with contrast Famotidine 20 mg BID Follow-up in 4-6 weeks  Please see the "Patient Instructions" section for addition details about the plan.  HPI: Carla Flowers is a 86 y.o. female referred by Dr. Melford Aase for further evaluation of dysphagia.  The history is obtained through the patient and review of CareEverywhere.  She has hyperlipidemia, hypertension, paroxysmal supraventricular tachycardia, stage III chronic kidney disease, and vitamin B12 deficiency.  She is on chronic Eliquis for paroxysmal atrial fibrillation  Patient has years of acid reflux including frequent nocturnal symptoms. No dysphagia or odynophagia. No sore throat, dysphonia, or neck pain. No evidence for GI bleeding, iron deficiency anemia, anorexia, unexplained weight loss,  persistent vomiting, or gastrointestinal cancer in a first-degree relative. Gained  weight over Christmas. Finds her diet very limited to the acid reflux.   Two months ago developed severe dull ache, nonradiating epigastric abdominal pain with associated nausea.  Associated eructation. She attributes the pain and nausea to gallstones. But, notes, this pain is different than any pain that she has had in the past. No early satiety. No change in bowel habits.   Was prescribed pantoprazole 40 mg QHS. Is using it 3 times a week at most because she felt it caused palpitations.  Finds just as helpful as the Tums.   Notes that she has tried "multiple" medications over the years including PeptoBismal and Rolaids.   An upper GI series 08/03/2019 to evaluate for reflux, bloating, and belching revealed mild esophageal dysmotility with pooling of contrast in the distal third of the esophagus that cleared with a combination of dry swallows and gravity.  There were no associated symptoms with this finding.  There was no evidence of hernia, ring, stricture, or reflux.  The stomach and proximal small intestines appeared normal.  Labs from 07/31/2019 show normal comprehensive metabolic panel except for a glucose of 103 and an estimated GFR of 51.  B12 was 1000, 101.  The last available CBC is from 08/2017 shows a hemoglobin of 14.0, MCV 90, RDW 13.5.  Distant colonoscopy, over 10 years ago. She remembers a normal exam.  Thinks she had a negative Cologard 3 years ago.  No prior upper endoscopy.   No known family history of colon cancer or polyps. No family history of uterine/endometrial cancer, pancreatic cancer or gastric/stomach cancer.   Past Medical History:  Diagnosis Date  . A-fib ([place])   . Asymmetric septal hypertrophy (HCC)   . GERD (gastroesophageal reflux disease)   .  H/O: rheumatic fever   . Hyperlipidemia   . Mitral valve prolapse   . Seizure ([place])   . SVT (supraventricular tachycardia) (HCC)    documented short RP tachycardia, previously adenosine sensitive    Past Surgical  History:  Procedure Laterality Date  . ABDOMINAL HYSTERECTOMY      Current Outpatient Medications  Medication Sig Dispense Refill  . Coenzyme Q10 (COQ-10) 100 MG CAPS Take 1 capsule by mouth daily.    Marland Kitchen diltiazem (CARDIZEM) 60 MG tablet TAKE 1-2 TABLETS DAILY AS NEEDED FOR PALPITATIONS 30 tablet 6  . diltiazem (TIAZAC) 120 MG 24 hr capsule TAKE 1 CAPSULE(120 MG) BY MOUTH DAILY 90 capsule 2  . ELIQUIS 5 MG TABS tablet TAKE 1 TABLET(5 MG) BY MOUTH TWICE DAILY 60 tablet 6  . levETIRAcetam (KEPPRA) 500 MG tablet Take 500 mg by mouth Twice daily.    . pravastatin (PRAVACHOL) 40 MG tablet Take 40 mg by mouth daily.     No current facility-administered medications for this visit.    Allergies as of 11/13/2019 - Review Complete 03/20/2019  Allergen Reaction Noted  . Sulfa antibiotics Other (See Comments) 07/30/2014  . Amoxicillin Other (See Comments) 10/27/2016  . Codeine Other (See Comments)   . Cortisone Hives and Other (See Comments)   . Nitrofurantoin Other (See Comments)   . Sulfonamide derivatives Other (See Comments)     Family History  Problem Relation Age of Onset  . Heart attack Mother     Social History   Socioeconomic History  . Marital status: Married    Spouse name: Not on file  . Number of children: Not on file  . Years of education: Not on file  . Highest education level: Not on file  Occupational History  . Not on file  Tobacco Use  . Smoking status: Never Smoker  . Smokeless tobacco: Never Used  Substance and Sexual Activity  . Alcohol use: No  . Drug use: No  . Sexual activity: Not on file  Other Topics Concern  . Not on file  Social History Narrative  . Not on file   Social Determinants of Health   Financial Resource Strain:   . Difficulty of Paying Living Expenses: Not on file  Food Insecurity:   . Worried About Charity fundraiser in the Last Year: Not on file  . Ran Out of Food in the Last Year: Not on file  Transportation Needs:   . Lack  of Transportation (Medical): Not on file  . Lack of Transportation (Non-Medical): Not on file  Physical Activity:   . Days of Exercise per Week: Not on file  . Minutes of Exercise per Session: Not on file  Stress:   . Feeling of Stress : Not on file  Social Connections:   . Frequency of Communication with Friends and Family: Not on file  . Frequency of Social Gatherings with Friends and Family: Not on file  . Attends Religious Services: Not on file  . Active Member of Clubs or Organizations: Not on file  . Attends Archivist Meetings: Not on file  . Marital Status: Not on file  Intimate Partner Violence:   . Fear of Current or Ex-Partner: Not on file  . Emotionally Abused: Not on file  . Physically Abused: Not on file  . Sexually Abused: Not on file    Review of Systems: 12 system ROS is negative except as noted above with the additions of arthritis in the neck.  Physical Exam: General:   Alert,  well-nourished, pleasant and cooperative in NAD Head:  Normocephalic and atraumatic. Eyes:  Sclera clear, no icterus.   Conjunctiva pink. Ears:  Normal auditory acuity. Nose:  No deformity, discharge,  or lesions. Mouth:  No deformity or lesions.   Neck:  Supple; no masses or thyromegaly. Lungs:  Clear throughout to auscultation.   No wheezes. Heart:  Regular rate and rhythm; no murmurs. Abdomen:  Soft,nontender, nondistended, normal bowel sounds, no rebound or guarding. No hepatosplenomegaly.   Rectal:  Deferred  Msk:  Symmetrical. No boney deformities LAD: No inguinal or umbilical LAD Extremities:  No clubbing or edema. Neurologic:  Alert and  oriented x4;  grossly nonfocal Skin:  Intact without significant lesions or rashes. Psych:  Alert and cooperative. Normal mood and affect.      Sheniece Ruggles L. Tarri Glenn, MD, MPH 11/13/2019, 8:41 AM

## 2019-11-14 ENCOUNTER — Other Ambulatory Visit: Payer: Medicare Other

## 2019-11-14 DIAGNOSIS — G8929 Other chronic pain: Secondary | ICD-10-CM

## 2019-11-14 DIAGNOSIS — R1310 Dysphagia, unspecified: Secondary | ICD-10-CM

## 2019-11-14 DIAGNOSIS — R933 Abnormal findings on diagnostic imaging of other parts of digestive tract: Secondary | ICD-10-CM

## 2019-11-14 LAB — BASIC METABOLIC PANEL
BUN: 16 mg/dL (ref 6–23)
CO2: 29 mEq/L (ref 19–32)
Calcium: 9.3 mg/dL (ref 8.4–10.5)
Chloride: 105 mEq/L (ref 96–112)
Creatinine, Ser: 1 mg/dL (ref 0.40–1.20)
GFR: 52.58 mL/min — ABNORMAL LOW (ref >60.00)
Glucose, Bld: 80 mg/dL (ref 70–99)
Potassium: 4 mEq/L (ref 3.5–5.1)
Sodium: 140 mEq/L (ref 135–145)

## 2019-11-17 ENCOUNTER — Telehealth: Payer: Self-pay | Admitting: Gastroenterology

## 2019-11-17 NOTE — Telephone Encounter (Signed)
Dr. Tarri Glenn, this patient is fearful of the IV contrast and is worried she may have an allergic reaction to it. Does she require the IV contrast? Can this CT be oral contrast only?

## 2019-11-17 NOTE — Telephone Encounter (Signed)
Pt is scheduled for a Ct scan on next Monday 1/18. She just read the instructions and realized that dye might be injected via IV, She states that she does not want that because she is allergic to several things and has never had dye so she is afraid. Pls call her.

## 2019-11-17 NOTE — Telephone Encounter (Signed)
Spoke to the patient who was adamant about not having the IV contrast and only the oral. Called radiology scheduling who changed the order to w/o IV contrast, oral only per Dr. Tarri Glenn. Amy Hazelwood sent message in case changing the order required new authorization.

## 2019-11-17 NOTE — Telephone Encounter (Signed)
Without the contrast we do not learn as much from the study, but, it would be better to perform it with oral contrast than without any. Thank you.

## 2019-11-20 ENCOUNTER — Ambulatory Visit (HOSPITAL_COMMUNITY)
Admission: RE | Admit: 2019-11-20 | Discharge: 2019-11-20 | Disposition: A | Discharge status: Home / Self Care | Payer: Medicare Other | Source: Ambulatory Visit | Attending: Gastroenterology | Admitting: Gastroenterology

## 2019-11-20 ENCOUNTER — Other Ambulatory Visit: Payer: Self-pay

## 2019-11-20 DIAGNOSIS — R933 Abnormal findings on diagnostic imaging of other parts of digestive tract: Secondary | ICD-10-CM | POA: Diagnosis present

## 2019-11-21 ENCOUNTER — Telehealth: Payer: Self-pay | Admitting: Gastroenterology

## 2019-11-21 NOTE — Telephone Encounter (Signed)
See result note.  

## 2019-11-23 DIAGNOSIS — K219 Gastro-esophageal reflux disease without esophagitis: Secondary | ICD-10-CM | POA: Insufficient documentation

## 2019-12-11 ENCOUNTER — Ambulatory Visit (INDEPENDENT_AMBULATORY_CARE_PROVIDER_SITE_OTHER): Payer: Medicare Other | Admitting: Gastroenterology

## 2019-12-11 ENCOUNTER — Encounter: Payer: Self-pay | Admitting: Gastroenterology

## 2019-12-11 DIAGNOSIS — G8929 Other chronic pain: Secondary | ICD-10-CM | POA: Diagnosis not present

## 2019-12-11 DIAGNOSIS — R1013 Epigastric pain: Secondary | ICD-10-CM

## 2019-12-11 DIAGNOSIS — K219 Gastro-esophageal reflux disease without esophagitis: Secondary | ICD-10-CM | POA: Diagnosis not present

## 2019-12-11 NOTE — Progress Notes (Signed)
TELEHEALTH VISIT  Referring Provider: [name], MD Primary Care Physician:  Carla Noon, MD   Tele-visit due to COVID-19 pandemic Patient requested visit virtually, consented to the virtual encounter via audio enabled telemedicine application (Doximity, converted to telephone due to difficulty) Contact made at: 11:30 am Patient verified by name and date of birth Location of patient: Home Location provider: [facility] Names of persons participating: Me, patient, Carla Flowers CMA Time spent on telehealth visit: 26 minutes I discussed the limitations of evaluation and management by telemedicine. The patient expressed understanding and agreed to proceed.  Chief complaint: Epigastric pain   IMPRESSION:  Epigastric pain with associated nausea x 2-3 months    -  UGIS 08/03/2019: mild esophageal dysmotility with pooling of contrast in the distal third of the esophagus that cleared with a combination of dry swallows and gravity.    - CT 11/20/2019: cholelithiasis, marked severity of aortic atherosclerosis, sigmoid diverticulosis Reflux, improving on daily PPI Eructation, improving Abnormal upper GI series Atrial fibrillation on Eliquis No known family of GI disease Distant colonoscopy for colon cancer screening (>10 years ago)  Recent epigastric pain with nausea and eructation: UGI series findings do not seem to explain her symptoms. CT scan showed no obvious source. Although she appears to be at high risk for mesenteric ischemia, her symptoms are not consistent with the diagnosis. Her improvement with PPI therapy suggests the possibility of GERD or acid peptic disease. Will continue pantoprazole. Resume famotidine 20 mg BID. If some symptoms persist and she continues to decline EGD, would consider esophageal manometry given her suspected esophageal dysmotility, recent palpitations, and cross-sectional abdominal imaging.  She remains resistant to any testing or  medications beyond x-rays due to risks.    Reflux: Better controlled on daily PPI. Will resume famotidine 20 mg BID to improve symptom control. Recent exacerbation by weight gain. Working to maintain a healthy weight may provide additional improvement.    PLAN: Continue pantoprazole 40 mg QD Famotidine 20 mg BID Work to maintain a health weight Follow-up in 2-3 months, earlier if needed  HPI: Carla Flowers is a 86 y.o. female initially referred for dysphagia in the setting of longstanding reflux. She was seen in consultation 11/13/19. She had a CT scan performed 11/20/19. She is seen today in scheduled follow-up. The interval history is obtained through the patient and review of CareEverywhere.  She has hyperlipidemia, hypertension, paroxysmal supraventricular tachycardia, stage III chronic kidney disease, and vitamin B12 deficiency.  She is on chronic Eliquis for paroxysmal atrial fibrillation  Patient has years of acid reflux including frequent nocturnal symptoms worsened after she gained weight over Christmas.   Three months ago developed severe dull ache, nonradiating epigastric abdominal pain with associated nausea and eructation.  No early satiety. No sitophobia. No change in bowel habits.  Was prescribed pantoprazole 40 mg QHS but was only using it 3 times a week at most because she felt it caused palpitations.  Finds just as helpful as the Tums. Has also tried PeptoBismal, Rolaids, and Tums over the years.  An upper GI series 08/03/2019 revealed mild esophageal dysmotility with pooling of contrast in the distal third of the esophagus that cleared with a combination of dry swallows and gravity.  There were no associated symptoms with this finding.  There was no evidence of hernia, ring, stricture, or reflux.  The stomach and proximal small intestines appeared normal.  Given her symptoms and no prior upper endoscopic evaluation, I recommended an  EGD. Carla Flowers declined endoscopy given her  advanced age that the time of her consultation.  We agreed to pursue CT scan instead. Trial of famotidine 20 mg BID recommended.    CT 11/20/2019 showed cholelithiasis without evidence for cholecystitis, marked severity of aortic atherosclerosis, sigmoid diverticulosis without evidence of diverticulitis.  Today she reports that her abdominal pain and nausea have resolved. Continues to have reflux but it's not as bad as it was before. She is most aware of it in the evening.  She finds the treatment with Protonix QHS has provided some relief. Had one episode of associated palpitations. Only took the famotidine until the time of the CT scan.  Was confused about the instructions.   Appetite is "too good." Weight is stable after her recent weight gain but she wants to lose 20 pounds.   No new complaints or concerns.    Past Medical History:  Diagnosis Date  . A-fib ([place])   . Asymmetric septal hypertrophy (H[facility])   . Basal cell carcinoma    nose  . GERD (gastroesophageal reflux disease)   . H/O: rheumatic fever   . Hyperlipidemia   . Mitral valve prolapse   . Seizure ([place])   . SVT (supraventricular tachycardia) (HCC)    documented short RP tachycardia, previously adenosine sensitive    Past Surgical History:  Procedure Laterality Date  . ABDOMINAL HYSTERECTOMY    . BASAL CELL CARCINOMA EXCISION     nose    Current Outpatient Medications  Medication Sig Dispense Refill  . Coenzyme Q10 (COQ-10) 100 MG CAPS Take 1 capsule by mouth daily.    Marland Kitchen diltiazem (CARDIZEM) 60 MG tablet TAKE 1-2 TABLETS DAILY AS NEEDED FOR PALPITATIONS 30 tablet 6  . diltiazem (TIAZAC) 120 MG 24 hr capsule TAKE 1 CAPSULE(120 MG) BY MOUTH DAILY 90 capsule 2  . ELIQUIS 5 MG TABS tablet TAKE 1 TABLET(5 MG) BY MOUTH TWICE DAILY 60 tablet 6  . famotidine (PEPCID) 20 MG tablet Take 1 tablet (20 mg total) by mouth 2 (two) times daily. 60 tablet 3  . levETIRAcetam (KEPPRA) 500 MG tablet Take 500 mg by mouth Twice daily.     . pantoprazole (PROTONIX) 40 MG tablet Take 40 mg by mouth daily.    . rosuvastatin (CRESTOR) 20 MG tablet Take 20 mg by mouth at bedtime.    . vitamin B-12 (CYANOCOBALAMIN) 1000 MCG tablet Take 1,000 mcg by mouth daily.     No current facility-administered medications for this visit.    Allergies as of 12/11/2019 - Review Complete 11/13/2019  Allergen Reaction Noted  . Sulfa antibiotics Other (See Comments) 07/30/2014  . Amoxicillin Other (See Comments) 10/27/2016  . Codeine Other (See Comments)   . Cortisone Hives and Other (See Comments)   . Macrobid [nitrofurantoin] Other (See Comments)   . Sulfonamide derivatives Other (See Comments)     Family History  Problem Relation Age of Onset  . Heart attack Mother   . Stroke Mother   . Prostate cancer Father   . Atrial fibrillation Sister     Social History   Socioeconomic History  . Marital status: Married    Spouse name: Not on file  . Number of children: 4  . Years of education: Not on file  . Highest education level: Not on file  Occupational History  . Occupation: retired  Tobacco Use  . Smoking status: Never Smoker  . Smokeless tobacco: Never Used  Substance and Sexual Activity  . Alcohol use: No  .  Drug use: No  . Sexual activity: Not on file  Other Topics Concern  . Not on file  Social History Narrative  . Not on file   Social Determinants of Health   Financial Resource Strain:   . Difficulty of Paying Living Expenses: Not on file  Food Insecurity:   . Worried About Charity fundraiser in the Last Year: Not on file  . Ran Out of Food in the Last Year: Not on file  Transportation Needs:   . Lack of Transportation (Medical): Not on file  . Lack of Transportation (Non-Medical): Not on file  Physical Activity:   . Days of Exercise per Week: Not on file  . Minutes of Exercise per Session: Not on file  Stress:   . Feeling of Stress : Not on file  Social Connections:   . Frequency of Communication with  Friends and Family: Not on file  . Frequency of Social Gatherings with Friends and Family: Not on file  . Attends Religious Services: Not on file  . Active Member of Clubs or Organizations: Not on file  . Attends Archivist Meetings: Not on file  . Marital Status: Not on file  Intimate Partner Violence:   . Fear of Current or Ex-Partner: Not on file  . Emotionally Abused: Not on file  . Physically Abused: Not on file  . Sexually Abused: Not on file    Physical Exam: Complete physical exam not performed due to the limits inherent in a telehealth encounter.  General: Awake, alert, and oriented, and well communicative. In no acute distress.  Pulm: No labored breathing, speaking in full sentences without conversational dyspnea  Psych: Pleasant, cooperative, normal speech, normal affect and normal insight Neuro: Alert and appropriate      Souleymane Saiki L. Tarri Glenn, MD, MPH 12/11/2019, 11:30 AM

## 2019-12-29 ENCOUNTER — Other Ambulatory Visit: Payer: Self-pay

## 2019-12-29 ENCOUNTER — Telehealth (INDEPENDENT_AMBULATORY_CARE_PROVIDER_SITE_OTHER): Payer: Medicare Other | Admitting: Internal Medicine

## 2019-12-29 ENCOUNTER — Encounter: Payer: Self-pay | Admitting: Internal Medicine

## 2019-12-29 VITALS — BP 122/65 | HR 63 | Ht 63.0 in | Wt 163.0 lb

## 2019-12-29 DIAGNOSIS — D6869 Other thrombophilia: Secondary | ICD-10-CM

## 2019-12-29 DIAGNOSIS — I471 Supraventricular tachycardia: Secondary | ICD-10-CM

## 2019-12-29 DIAGNOSIS — I480 Paroxysmal atrial fibrillation: Secondary | ICD-10-CM | POA: Diagnosis not present

## 2019-12-29 NOTE — Progress Notes (Signed)
Electrophysiology TeleHealth Note  Due to national recommendations of social distancing due to [place] 19, an audio telehealth visit is felt to be most appropriate for this patient at this time.  Verbal consent was obtained by me for the telehealth visit today.  The patient does not have capability for a virtual visit.  A phone visit is therefore required today.   Date:  12/29/2019   ID:  Carla Flowers, DOB 10/20/1932, MRN [id number]  Location: patient's home  Provider location:  [place]  Evaluation Performed: Follow-up visit  PCP:  [name], MD   Electrophysiologist:  Dr [name]  Chief Complaint:  palpitations  History of Present Illness:    Carla Flowers is a 86 y.o. female who presents via telehealth conferencing today.  Since last being seen in our clinic, the patient reports doing very well.  She has occasional palpitations which she attributes to afib.  This occurs every few months.  She does not think that additional medicine would be beneficial.  She uses prn diltiazem with some improvement.  She has diverticulosis and occasional GI symptoms.  Today, she denies symptoms of chest pain, shortness of breath,  lower extremity edema, dizziness, presyncope, or syncope.  The patient is otherwise without complaint today.  The patient denies symptoms of fevers, chills, cough, or new SOB worrisome for COVID 19.  Past Medical History:  Diagnosis Date  . A-fib ([place])   . Asymmetric septal hypertrophy (H[facility])   . Basal cell carcinoma    nose  . GERD (gastroesophageal reflux disease)   . H/O: rheumatic fever   . Hyperlipidemia   . Mitral valve prolapse   . Seizure ([place])   . SVT (supraventricular tachycardia) (H[facility])    documented short RP tachycardia, previously adenosine sensitive    Past Surgical History:  Procedure Laterality Date  . ABDOMINAL HYSTERECTOMY    . BASAL CELL CARCINOMA EXCISION     nose    Current Outpatient Medications  Medication Sig Dispense  Refill  . Coenzyme Q10 (COQ-10) 100 MG CAPS Take 1 capsule by mouth daily.    Marland Kitchen diltiazem (CARDIZEM) 60 MG tablet TAKE 1-2 TABLETS DAILY AS NEEDED FOR PALPITATIONS 30 tablet 6  . diltiazem (TIAZAC) 120 MG 24 hr capsule TAKE 1 CAPSULE(120 MG) BY MOUTH DAILY 90 capsule 2  . ELIQUIS 5 MG TABS tablet TAKE 1 TABLET(5 MG) BY MOUTH TWICE DAILY 60 tablet 6  . famotidine (PEPCID) 20 MG tablet Take 1 tablet (20 mg total) by mouth 2 (two) times daily. 60 tablet 3  . levETIRAcetam (KEPPRA) 500 MG tablet Take 500 mg by mouth Twice daily.    . pantoprazole (PROTONIX) 40 MG tablet Take 40 mg by mouth daily.    . rosuvastatin (CRESTOR) 20 MG tablet Take 20 mg by mouth at bedtime.    . vitamin B-12 (CYANOCOBALAMIN) 1000 MCG tablet Take 1,000 mcg by mouth daily.     No current facility-administered medications for this visit.    Allergies:   Sulfa antibiotics, Amoxicillin, Codeine, Cortisone, Macrobid [nitrofurantoin], and Sulfonamide derivatives   Social History:  The patient  reports that she has never smoked. She has never used smokeless tobacco. She reports that she does not drink alcohol or use drugs.   Family History:  The patient's family history includes Atrial fibrillation in her sister; Heart attack in her mother; Prostate cancer in her father; Stroke in her mother.   ROS:  Please see the history of present illness.  All other systems are personally reviewed and negative.    Exam:    Vital Signs:  BP 122/65   Pulse 63   Ht 5\' 3"  (1.6 m)   Wt 163 lb (73.9 kg)   BMI 28.87 kg/m   Well sounding, alert and conversant   Labs/Other Tests and Data Reviewed:    Recent Labs: 11/14/2019: BUN 16; Creatinine, Ser 1.00; Potassium 4.0; Sodium 140   Wt Readings from Last 3 Encounters:  12/29/19 163 lb (73.9 kg)  11/13/19 168 lb (76.2 kg)  06/14/19 160 lb 9.6 oz (72.8 kg)     ASSESSMENT & PLAN:    1.  Paroxysmal atrial fibrillation On eliquis chads2vasc score is at least 3 bmet 11/14/19 is  reviewed.  She is on the appropriate dose of eliquis.  2. SVT Short RP, adenosine sensitive Given advanced age, we have decided to avoid ablation.   Follow-up:  with EP APP in a year   Patient Risk:  after full review of this patients clinical status, I feel that they are at moderate risk at this time.  Today, I have spent 15 minutes with the patient with telehealth technology discussing arrhythmia management .    Army Fossa, MD  12/29/2019 12:41 PM     [place] [address] [phone] (office) [phone] (fax)

## 2020-04-12 ENCOUNTER — Other Ambulatory Visit: Payer: Self-pay | Admitting: Gastroenterology

## 2020-04-12 ENCOUNTER — Other Ambulatory Visit: Payer: Self-pay | Admitting: Internal Medicine

## 2020-04-12 NOTE — Telephone Encounter (Signed)
Eliquis 5mg  refill request received. Patient is 86 years old, weight-73.9kg, Crea-1.00 on 11/14/2019, Diagnosis-Afib, and last seen by Dr. [name] on 12/29/2019. Dose is appropriate based on dosing criteria. Will send in refill to requested pharmacy.

## 2020-05-25 ENCOUNTER — Observation Stay (HOSPITAL_COMMUNITY)
Admission: EM | Admit: 2020-05-25 | Discharge: 2020-05-26 | Disposition: A | Discharge status: Home / Self Care | Payer: Medicare Other | Attending: Internal Medicine | Admitting: Internal Medicine

## 2020-05-25 ENCOUNTER — Other Ambulatory Visit: Payer: Self-pay

## 2020-05-25 DIAGNOSIS — Z20822 Contact with and (suspected) exposure to covid-19: Secondary | ICD-10-CM | POA: Insufficient documentation

## 2020-05-25 DIAGNOSIS — I4819 Other persistent atrial fibrillation: Secondary | ICD-10-CM | POA: Diagnosis present

## 2020-05-25 DIAGNOSIS — Z85828 Personal history of other malignant neoplasm of skin: Secondary | ICD-10-CM | POA: Insufficient documentation

## 2020-05-25 DIAGNOSIS — R42 Dizziness and giddiness: Secondary | ICD-10-CM

## 2020-05-25 DIAGNOSIS — Z79899 Other long term (current) drug therapy: Secondary | ICD-10-CM | POA: Insufficient documentation

## 2020-05-25 DIAGNOSIS — R55 Syncope and collapse: Secondary | ICD-10-CM | POA: Diagnosis not present

## 2020-05-25 DIAGNOSIS — R000 Tachycardia, unspecified: Secondary | ICD-10-CM

## 2020-05-25 MED ORDER — SODIUM CHLORIDE 0.9% FLUSH
3.0000 mL | Freq: Once | INTRAVENOUS | Status: AC
  Administered 2020-05-26: 3 mL via INTRAVENOUS

## 2020-05-25 NOTE — ED Triage Notes (Addendum)
Pt presents to ED POV. Pt c/o near syncope, heart racing, and hypotension. Pt reports that she had 3 episodes today where she felt like she was going to pass out, had a racing heart rate. At home bp - 90/60, and HR in 180s. Initial HR in triage 170s

## 2020-05-25 NOTE — ED Notes (Signed)
Pt. Transported to xray 

## 2020-05-25 NOTE — ED Provider Notes (Signed)
[facility]ERG[facility] DEPARTMENT Provider Note   CSN: [id number] Arrival date & time: 05/25/20  2315     History Chief Complaint  Patient presents with  . Tachycardia    Carla Flowers is a 86 y.o. female.  Patient presents to the emergency department for evaluation of multiple episodes of near syncope.  Patient reports that she has had at least 3 episodes tonight of sensation of "buzzing" in her head followed by racing heartbeat.  Patient was able to check her blood pressure and heart rate with a monitor during these episodes.  Heart rate was in the 180s and blood pressure was in the 06C systolic.  Patient reports that these episodes last for approximately 30 seconds to 1 minute and then improved.        Past Medical History:  Diagnosis Date  . A-fib ([place])   . Asymmetric septal hypertrophy (HCC)   . Basal cell carcinoma    nose  . GERD (gastroesophageal reflux disease)   . H/O: rheumatic fever   . Hyperlipidemia   . Mitral valve prolapse   . Seizure ([place])   . SVT (supraventricular tachycardia) (HCC)    documented short RP tachycardia, previously adenosine sensitive    Patient Active Problem List   Diagnosis Date Noted  . Elevated troponin level 07/04/2014  . Shortness of breath 10/02/2013  . Chest pain 10/02/2013  . Hyperlipemia 05/07/2013  . Tachycardia-bradycardia ([place]) 05/07/2013  . MVP (mitral valve prolapse) 12/10/2011  . Vitamin D deficiency 12/10/2011  . Benign essential HTN 11/17/2010  . PSVT (paroxysmal supraventricular tachycardia) ([place]) 11/17/2010  . 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 PALPITATIONS 10/01/2009    Past Surgical History:  Procedure Laterality Date  . ABDOMINAL HYSTERECTOMY    . BASAL CELL CARCINOMA EXCISION     nose     OB History   No obstetric history on file.     Family History  Problem Relation Age of Onset  . Heart attack Mother   . Stroke Mother   . Prostate cancer Father   .  Atrial fibrillation Sister     Social History   Tobacco Use  . Smoking status: Never Smoker  . Smokeless tobacco: Never Used  Vaping Use  . Vaping Use: Never used  Substance Use Topics  . Alcohol use: No  . Drug use: No    Home Medications Prior to Admission medications   Medication Sig Start Date End Date Taking? Authorizing Provider  Coenzyme Q10 (COQ-10) 100 MG CAPS Take 1 capsule by mouth daily.    [provider]  diltiazem (CARDIZEM) 60 MG tablet TAKE 1-2 TABLETS DAILY AS NEEDED FOR PALPITATIONS 04/26/19   Allred, Jeneen Rinks, MD  diltiazem (TIAZAC) 120 MG 24 hr capsule TAKE 1 CAPSULE(120 MG) BY MOUTH DAILY 09/21/19   Allred, Jeneen Rinks, MD  ELIQUIS 5 MG TABS tablet TAKE 1 TABLET(5 MG) BY MOUTH TWICE DAILY 04/12/20   Allred, Jeneen Rinks, MD  famotidine (PEPCID) 20 MG tablet TAKE 1 TABLET(20 MG) BY MOUTH TWICE DAILY 04/12/20   Thornton Park, MD  levETIRAcetam (KEPPRA) 500 MG tablet Take 500 mg by mouth Twice daily. 12/10/11   [provider]  pantoprazole (PROTONIX) 40 MG tablet Take 40 mg by mouth daily. 07/31/19   [provider]  rosuvastatin (CRESTOR) 20 MG tablet Take 20 mg by mouth at bedtime. 10/06/19   [provider]  vitamin B-12 (CYANOCOBALAMIN) 1000 MCG tablet Take 1,000 mcg by mouth daily.  [provider]    Allergies    Sulfa antibiotics, Amoxicillin, Codeine, Cortisone, Macrobid [nitrofurantoin], and Sulfonamide derivatives  Review of Systems   Review of Systems  Cardiovascular: Positive for palpitations.  All other systems reviewed and are negative.   Physical Exam Updated Vital Signs BP (!) 109/61 (BP Location: Right Arm)   Pulse 87   Temp 97.9 F (36.6 C) (Oral)   Resp 14   SpO2 91%   Physical Exam Vitals and nursing note reviewed.  Constitutional:      General: She is not in acute distress.    Appearance: Normal appearance. She is well-developed.  HENT:     Head: Normocephalic and atraumatic.     Right Ear:  Hearing normal.     Left Ear: Hearing normal.     Nose: Nose normal.  Eyes:     Conjunctiva/sclera: Conjunctivae normal.     Pupils: Pupils are equal, round, and reactive to light.  Cardiovascular:     Rate and Rhythm: Regular rhythm.     Heart sounds: S1 normal and S2 normal. No murmur heard.  No friction rub. No gallop.   Pulmonary:     Effort: Pulmonary effort is normal. No respiratory distress.     Breath sounds: Normal breath sounds.  Chest:     Chest wall: No tenderness.  Abdominal:     General: Bowel sounds are normal.     Palpations: Abdomen is soft.     Tenderness: There is no abdominal tenderness. There is no guarding or rebound. Negative signs include Murphy's sign and McBurney's sign.     Hernia: No hernia is present.  Musculoskeletal:        General: Normal range of motion.     Cervical back: Normal range of motion and neck supple.  Skin:    General: Skin is warm and dry.     Findings: No rash.  Neurological:     Mental Status: She is alert and oriented to person, place, and time.     GCS: GCS eye subscore is 4. GCS verbal subscore is 5. GCS motor subscore is 6.     Cranial Nerves: No cranial nerve deficit.     Sensory: No sensory deficit.     Coordination: Coordination normal.  Psychiatric:        Speech: Speech normal.        Behavior: Behavior normal.        Thought Content: Thought content normal.     ED Results / Procedures / Treatments   Labs (all labs ordered are listed, but only abnormal results are displayed) Labs Reviewed  BASIC METABOLIC PANEL - Abnormal; Notable for the following components:      Result Value   Glucose, Bld 146 (*)    Creatinine, Ser 1.28 (*)    GFR calc non Af Amer 38 (*)    GFR calc Af Amer 44 (*)    All other components within normal limits  CBC - Abnormal; Notable for the following components:   WBC 11.2 (*)    HCT 46.2 (*)    All other components within normal limits  TROPONIN I (HIGH SENSITIVITY)    EKG EKG  Interpretation  Date/Time:  Saturday May 25 2020 23:41:25 EDT Ventricular Rate:  89 PR Interval:  228 QRS Duration: 114 QT Interval:  380 QTC Calculation: 462 R Axis:   -27 Text Interpretation: Sinus rhythm with 1st degree A-V block Left ventricular hypertrophy with repolarization abnormality ( R in aVL ,  Cornell product , Romhilt-Estes ) Cannot rule out Septal infarct , age undetermined Abnormal ECG No significant change since last tracing Confirmed by Orpah Greek [phone]) on 05/25/2020 11:47:06 PM   Radiology DG Chest 2 View  Result Date: 05/26/2020 CLINICAL DATA:  Chest pain EXAM: CHEST - 2 VIEW COMPARISON:  None. FINDINGS: Aortic calcifications are noted. The heart size is normal. There is no pneumothorax or large pleural effusion. Hazy bibasilar airspace opacities are noted and are favored to represent atelectasis and scarring as seen on the patient's recent CT of the abdomen. There is no acute osseous abnormality. IMPRESSION: No active cardiopulmonary disease. Electronically Signed   By: Constance Holster M.D.   On: 05/26/2020 00:14    Procedures Procedures (including critical care time)  Medications Ordered in ED Medications  sodium chloride flush (NS) 0.9 % injection 3 mL (has no administration in time range)    ED Course  I have reviewed the triage vital signs and the nursing notes.  Pertinent labs & imaging results that were available during my care of the patient were reviewed by me and considered in my medical decision making (see chart for details).    MDM Rules/Calculators/A&P                          Patient with previous history of SVT and paroxysmal atrial fibrillation presents to the emergency department with multiple episodes of near syncope coinciding with racing heartbeat and low blood pressure.  Patient in sinus rhythm here in the ER.  She is anticoagulated on Eliquis, has not missed any doses.  Patient has not had any recurrent episodes here in  the emergency department.  Based on the documented hypotension and elevated heart rate at arrival to the emergency department, however, will require observation.  Final Clinical Impression(s) / ED Diagnoses Final diagnoses:  Near syncope  Tachycardia    Rx / DC Orders ED Discharge Orders    None       Eulamae Greenstein, Gwenyth Allegra, MD 05/26/20 0130

## 2020-05-26 ENCOUNTER — Emergency Department (HOSPITAL_COMMUNITY): Payer: Medicare Other

## 2020-05-26 ENCOUNTER — Observation Stay (HOSPITAL_BASED_OUTPATIENT_CLINIC_OR_DEPARTMENT_OTHER): Payer: Medicare Other

## 2020-05-26 DIAGNOSIS — I361 Nonrheumatic tricuspid (valve) insufficiency: Secondary | ICD-10-CM | POA: Diagnosis not present

## 2020-05-26 DIAGNOSIS — R42 Dizziness and giddiness: Secondary | ICD-10-CM

## 2020-05-26 DIAGNOSIS — I340 Nonrheumatic mitral (valve) insufficiency: Secondary | ICD-10-CM

## 2020-05-26 DIAGNOSIS — R002 Palpitations: Secondary | ICD-10-CM

## 2020-05-26 DIAGNOSIS — I480 Paroxysmal atrial fibrillation: Secondary | ICD-10-CM | POA: Diagnosis not present

## 2020-05-26 DIAGNOSIS — I4891 Unspecified atrial fibrillation: Secondary | ICD-10-CM | POA: Insufficient documentation

## 2020-05-26 DIAGNOSIS — I4819 Other persistent atrial fibrillation: Secondary | ICD-10-CM | POA: Diagnosis present

## 2020-05-26 DIAGNOSIS — R7989 Other specified abnormal findings of blood chemistry: Secondary | ICD-10-CM

## 2020-05-26 DIAGNOSIS — R55 Syncope and collapse: Secondary | ICD-10-CM

## 2020-05-26 HISTORY — DX: Other persistent atrial fibrillation: I48.19

## 2020-05-26 LAB — ECHOCARDIOGRAM COMPLETE
Area-P 1/2: 3.48 cm2
Height: 65 in
MV M vel: 4.06 m/s
MV Peak grad: 65.9 mmHg
S' Lateral: 2.7 cm
Weight: 2512 oz

## 2020-05-26 LAB — BASIC METABOLIC PANEL
Anion gap: 8 (ref 5–15)
BUN: 18 mg/dL (ref 8–23)
CO2: 26 mmol/L (ref 22–32)
Calcium: 9.4 mg/dL (ref 8.9–10.3)
Chloride: 105 mmol/L (ref 98–111)
Creatinine, Ser: 1.28 mg/dL — ABNORMAL HIGH (ref 0.44–1.00)
GFR calc Af Amer: 44 mL/min — ABNORMAL LOW (ref >60)
GFR calc non Af Amer: 38 mL/min — ABNORMAL LOW (ref >60)
Glucose, Bld: 146 mg/dL — ABNORMAL HIGH (ref 70–99)
Potassium: 4.4 mmol/L (ref 3.5–5.1)
Sodium: 139 mmol/L (ref 135–145)

## 2020-05-26 LAB — CBC
HCT: 46.2 % — ABNORMAL HIGH (ref 36.0–46.0)
Hemoglobin: 14.8 g/dL (ref 12.0–15.0)
MCH: 30.3 pg (ref 26.0–34.0)
MCHC: 32 g/dL (ref 30.0–36.0)
MCV: 94.7 fL (ref 80.0–100.0)
Platelets: 273 10*3/uL (ref 150–400)
RBC: 4.88 MIL/uL (ref 3.87–5.11)
RDW: 12.6 % (ref 11.5–15.5)
WBC: 11.2 10*3/uL — ABNORMAL HIGH (ref 4.0–10.5)
nRBC: 0 % (ref 0.0–0.2)

## 2020-05-26 LAB — SARS CORONAVIRUS 2 BY RT PCR (HOSPITAL ORDER, PERFORMED IN ~~LOC~~ HOSPITAL LAB): SARS Coronavirus 2: NEGATIVE

## 2020-05-26 LAB — TROPONIN I (HIGH SENSITIVITY)
Troponin I (High Sensitivity): 75 ng/L — ABNORMAL HIGH (ref <18)
Troponin I (High Sensitivity): 112 ng/L (ref <18)

## 2020-05-26 MED ORDER — ROSUVASTATIN CALCIUM 20 MG PO TABS: 20.0000 mg | ORAL_TABLET | Freq: Every day | ORAL | Status: DC

## 2020-05-26 MED ORDER — APIXABAN 5 MG PO TABS
5.0000 mg | ORAL_TABLET | Freq: Two times a day (BID) | ORAL | Status: DC
  Filled 2020-05-26 (×2): qty 1

## 2020-05-26 MED ORDER — ONDANSETRON HCL 4 MG/2ML IJ SOLN: 4.0000 mg | Freq: Four times a day (QID) | INTRAMUSCULAR | Status: DC | PRN

## 2020-05-26 MED ORDER — DILTIAZEM HCL ER COATED BEADS 120 MG PO CP24
120.0000 mg | ORAL_CAPSULE | Freq: Every day | ORAL | Status: DC
  Filled 2020-05-26: qty 1

## 2020-05-26 MED ORDER — LEVETIRACETAM 500 MG PO TABS
500.0000 mg | ORAL_TABLET | Freq: Two times a day (BID) | ORAL | Status: DC
  Administered 2020-05-26: 500 mg via ORAL
  Filled 2020-05-26: qty 1

## 2020-05-26 MED ORDER — PANTOPRAZOLE SODIUM 40 MG PO TBEC: 40.0000 mg | DELAYED_RELEASE_TABLET | Freq: Every day | ORAL | Status: DC

## 2020-05-26 MED ORDER — ACETAMINOPHEN 325 MG PO TABS: 650.0000 mg | ORAL_TABLET | ORAL | Status: DC | PRN

## 2020-05-26 MED ORDER — FAMOTIDINE 20 MG PO TABS
20.0000 mg | ORAL_TABLET | Freq: Two times a day (BID) | ORAL | Status: DC
  Administered 2020-05-26: 20 mg via ORAL
  Filled 2020-05-26 (×2): qty 1

## 2020-05-26 NOTE — ED Notes (Addendum)
Pt. Transported to restroom

## 2020-05-26 NOTE — Discharge Summary (Signed)
Discharge Summary    Patient ID: Carla Flowers MRN: [id number]; DOB: May 29, 1932  Admit date: 05/25/2020 Discharge date: 05/26/2020  Primary Care Provider: [name], MD  Primary Cardiologist: Thompson Grayer, MD  Primary Electrophysiologist:  None   Discharge Diagnoses    Principal Problem:   Postural dizziness with presyncope Active Problems:   Persistent atrial fibrillation with rapid ventricular response (HCC)   Allergies Allergies  Allergen Reactions  . Sulfa Antibiotics Other (See Comments)    'couldn't breathe good'  . Amoxicillin Other (See Comments)    Tremors  . Codeine Other (See Comments)    unknown  . Cortisone Hives and Other (See Comments)    Childhood reaction Unknown   . Macrobid [Nitrofurantoin] Other (See Comments)    'achy all over', chills   . Sulfonamide Derivatives Other (See Comments)    unknown    Diagnostic Studies/Procedures    ECHO: 05/26/2020 1. Left ventricular ejection fraction, by estimation, is 60 to 65%. The  left ventricle has normal function. The left ventricle has no regional  wall motion abnormalities. There is moderate left ventricular hypertrophy.  Left ventricular diastolic parameters are consistent with Grade II diastolic dysfunction (pseudonormalization). Elevated left ventricular end-diastolic pressure.  2. Right ventricular systolic function is normal. The right ventricular  size is normal. There is normal pulmonary artery systolic pressure.  3. Left atrial size was moderately dilated. R atrium normal size 4. The mitral valve is abnormal. Mild mitral valve regurgitation.  5. The aortic valve is tricuspid. Aortic valve regurgitation is trivial.  Mild aortic valve sclerosis is present, with no evidence of aortic valve  stenosis.  6. The inferior vena cava is normal in size with greater than 50%  respiratory variability, suggesting right atrial pressure of 3 mmHg.  _____________   History of Present Illness      Carla Flowers is a 86 y.o. female with PAF, SVT, MVP, GERD, Sz, rheumatic fever. She developed rapid palpitations (HR up to 180) and they were making her feel light-headed. She came to the ER and was admitted.  Hospital Course     Consultants: None   She was in SR on arrival to the ER, no atrial fib or SVT overnight.   She had a mild elevation in her troponin, but no chest pain. The enzyme elevation was felt to be demand ischemia secondary to her rapid HR.   An echo was performed 07/25, results are above. Her EF is normal, with no WMA. Mildly elevated LVEDP, but no significant volume overload on exam.   She is currently maintaining SR, but had some atrial bigeminy as well.   SBP is 110s, so do not feel comfortable increasing the Dilt.   She is currently stable, will discharge home and message has been sent to get early follow up with Dr [name] or the Afib clinic.    Did the patient have an acute coronary syndrome (MI, NSTEMI, STEMI, etc) this admission?:  No.   The elevated Troponin was due to the acute medical illness (demand ischemia).  _____________  Discharge Vitals Blood pressure (!) 119/63, pulse 63, temperature 97.9 F (36.6 C), temperature source Oral, resp. rate 20, height 5\' 5"  (1.651 m), weight 71.2 kg, SpO2 91 %.  Filed Weights   05/26/20 0152  Weight: 71.2 kg  General: Well developed, well nourished, female in no acute distress Head: Eyes PERRLA, Head normocephalic and atraumatic Lungs: clear bilaterally to auscultation. Heart: HRRR S1 S2, without rub or gallop. No  murmur. 4/4 extremity pulses are 2+ & equal. No JVD. Abdomen: Bowel sounds are present, abdomen soft and non-tender without masses or  hernias noted. Msk: Normal strength and tone for age. Extremities: No clubbing, cyanosis or edema.    Skin:  No rashes or lesions noted. Neuro: Alert and oriented X 3. Psych:  Good affect, responds appropriately   Labs & Radiologic Studies    CBC Recent Labs     05/25/20 2342  WBC 11.2*  HGB 14.8  HCT 46.2*  MCV 94.7  PLT 629   Basic Metabolic Panel Recent Labs    05/25/20 2342  NA 139  K 4.4  CL 105  CO2 26  GLUCOSE 146*  BUN 18  CREATININE 1.28*  CALCIUM 9.4   High Sensitivity Troponin:   Recent Labs  Lab 05/26/20 0201 05/26/20 0727  TROPONINIHS 75* 112*    BNP Invalid input(s): POCBNP  Thyroid Function Tests No results found for: TSH  _____________  DG Chest 2 View  Result Date: 05/26/2020 CLINICAL DATA:  Chest pain EXAM: CHEST - 2 VIEW COMPARISON:  None. FINDINGS: Aortic calcifications are noted. The heart size is normal. There is no pneumothorax or large pleural effusion. Hazy bibasilar airspace opacities are noted and are favored to represent atelectasis and scarring as seen on the patient's recent CT of the abdomen. There is no acute osseous abnormality. IMPRESSION: No active cardiopulmonary disease. Electronically Signed   By: Constance Holster M.D.   On: 05/26/2020 00:14   ECHOCARDIOGRAM COMPLETE  Result Date: 05/26/2020    ECHOCARDIOGRAM REPORT   Patient Name:   Carla Flowers Date of Exam: 05/26/2020 Medical Rec #:  [id number]        Height:       65.0 in Accession #:    [id number]       Weight:       157.0 lb Date of Birth:  31-May-1932         BSA:          1.785 m Patient Age:    86 years         BP:           119/63 mmHg Patient Gender: F                HR:           62 bpm. Exam Location:  Inpatient Procedure: 2D Echo Indications:    Palpitations 785.1 / R00.2  History:        Patient has prior history of Echocardiogram examinations, most                 recent 07/02/2014. Arrythmias:Atrial Fibrillation; Risk                 Factors:Non-Smoker and Hypertension. Mitral Valve Prolapse, PSVT                 (paroxysmal supraventricular tachycardia, OTHER CONGENITAL                 ANOMALY OF AORTA.  Sonographer:    Leavy Cella Referring Phys: [phone] ADAM S BARNETT IMPRESSIONS  1. Left ventricular ejection fraction,  by estimation, is 60 to 65%. The left ventricle has normal function. The left ventricle has no regional wall motion abnormalities. There is moderate left ventricular hypertrophy. Left ventricular diastolic parameters are consistent with Grade II diastolic dysfunction (pseudonormalization). Elevated left ventricular end-diastolic pressure.  2. Right ventricular systolic function is normal. The right ventricular size  is normal. There is normal pulmonary artery systolic pressure.  3. Left atrial size was moderately dilated.  4. The mitral valve is abnormal. Mild mitral valve regurgitation.  5. The aortic valve is tricuspid. Aortic valve regurgitation is trivial. Mild aortic valve sclerosis is present, with no evidence of aortic valve stenosis.  6. The inferior vena cava is normal in size with greater than 50% respiratory variability, suggesting right atrial pressure of 3 mmHg. FINDINGS  Left Ventricle: Left ventricular ejection fraction, by estimation, is 60 to 65%. The left ventricle has normal function. The left ventricle has no regional wall motion abnormalities. The left ventricular internal cavity size was normal in size. There is  moderate left ventricular hypertrophy. Left ventricular diastolic parameters are consistent with Grade II diastolic dysfunction (pseudonormalization). Elevated left ventricular end-diastolic pressure. Right Ventricle: The right ventricular size is normal. No increase in right ventricular wall thickness. Right ventricular systolic function is normal. There is normal pulmonary artery systolic pressure. The tricuspid regurgitant velocity is 2.47 m/s, and  with an assumed right atrial pressure of 3 mmHg, the estimated right ventricular systolic pressure is 96.7 mmHg. Left Atrium: Left atrial size was moderately dilated. Right Atrium: Right atrial size was normal in size. Pericardium: There is no evidence of pericardial effusion. Mitral Valve: The mitral valve is abnormal. There is mild  thickening of the mitral valve leaflet(s). Mild mitral valve regurgitation. Tricuspid Valve: The tricuspid valve is grossly normal. Tricuspid valve regurgitation is mild. Aortic Valve: The aortic valve is tricuspid. Aortic valve regurgitation is trivial. Mild aortic valve sclerosis is present, with no evidence of aortic valve stenosis. Pulmonic Valve: The pulmonic valve was normal in structure. Pulmonic valve regurgitation is trivial. Aorta: The aortic root and ascending aorta are structurally normal, with no evidence of dilitation. Venous: The inferior vena cava is normal in size with greater than 50% respiratory variability, suggesting right atrial pressure of 3 mmHg. IAS/Shunts: No atrial level shunt detected by color flow Doppler.  LEFT VENTRICLE PLAX 2D LVIDd:         4.20 cm  Diastology LVIDs:         2.70 cm  LV e' lateral:   6.53 cm/s LV PW:         1.20 cm  LV E/e' lateral: 12.1 LV IVS:        1.40 cm  LV e' medial:    4.13 cm/s LVOT diam:     1.90 cm  LV E/e' medial:  19.2 LVOT Area:     2.84 cm  RIGHT VENTRICLE TAPSE (M-mode): 1.3 cm LEFT ATRIUM             Index       RIGHT ATRIUM          Index LA diam:        4.50 cm 2.52 cm/m  RA Area:     9.63 cm LA Vol (A2C):   43.7 ml 24.49 ml/m RA Volume:   19.80 ml 11.09 ml/m LA Vol (A4C):   75.5 ml 42.30 ml/m LA Biplane Vol: 60.1 ml 33.67 ml/m   AORTA Ao Root diam: 3.00 cm MITRAL VALVE               TRICUSPID VALVE MV Area (PHT): 3.48 cm    TR Peak grad:   24.4 mmHg MV Decel Time: 218 msec    TR Vmax:        247.00 cm/s MR Peak grad: 65.9 mmHg MR Vmax:  406.00 cm/s  SHUNTS MV E velocity: 79.30 cm/s  Systemic Diam: 1.90 cm MV A velocity: 40.30 cm/s MV E/A ratio:  1.97 Lyman Bishop MD Electronically signed by Lyman Bishop MD Signature Date/Time: 05/26/2020/12:46:30 PM    Final    Disposition   Pt is being discharged home today in improved condition.  Follow-up Plans & Appointments     Discharge Instructions    Diet - low sodium heart  healthy   Complete by: As directed    Increase activity slowly   Complete by: As directed       Discharge Medications   Allergies as of 05/26/2020      Reactions   Sulfa Antibiotics Other (See Comments)   'couldn't breathe good'   Amoxicillin Other (See Comments)   Tremors   Codeine Other (See Comments)   unknown   Cortisone Hives, Other (See Comments)   Childhood reaction Unknown   Macrobid [nitrofurantoin] Other (See Comments)   'achy all over', chills   Sulfonamide Derivatives Other (See Comments)   unknown      Medication List    TAKE these medications   CoQ-10 100 MG Caps Take 1 capsule by mouth daily.   diltiazem 120 MG 24 hr capsule Commonly known as: TIAZAC TAKE 1 CAPSULE(120 MG) BY MOUTH DAILY What changed:   how much to take  how to take this  when to take this  additional instructions   diltiazem 60 MG tablet Commonly known as: CARDIZEM TAKE 1-2 TABLETS DAILY AS NEEDED FOR PALPITATIONS What changed:   how much to take  how to take this  when to take this  reasons to take this  additional instructions   Eliquis 5 MG Tabs tablet Generic drug: apixaban TAKE 1 TABLET(5 MG) BY MOUTH TWICE DAILY   famotidine 20 MG tablet Commonly known as: PEPCID TAKE 1 TABLET(20 MG) BY MOUTH TWICE DAILY What changed: See the new instructions.   levETIRAcetam 500 MG tablet Commonly known as: KEPPRA Take 500 mg by mouth Twice daily.   pantoprazole 40 MG tablet Commonly known as: PROTONIX Take 40 mg by mouth at bedtime.   rosuvastatin 20 MG tablet Commonly known as: CRESTOR Take 20 mg by mouth at bedtime.   vitamin B-12 1000 MCG tablet Commonly known as: CYANOCOBALAMIN Take 1,000 mcg by mouth daily.          Outstanding Labs/Studies   None  Duration of Discharge Encounter   Greater than 30 minutes including physician time.  Signed, Rosaria Ferries, PA-C 05/26/2020, 2:08 PM

## 2020-05-26 NOTE — ED Notes (Signed)
Discharge papers printed.  Instructions given to patient.  Patient verbalized understanding.

## 2020-05-26 NOTE — ED Notes (Addendum)
Pt. Transported to restroom again x2

## 2020-05-26 NOTE — H&P (Signed)
Cardiology History & Physical    Patient ID: Carla Flowers MRN: [id number]; DOB: May 13, 1932   Admission date: 05/25/2020  Primary Care Provider: [name], MD Primary Cardiologist: Thompson Grayer, MD  Primary Electrophysiologist:  None   Chief Complaint:  lightheadedness  Patient Profile:   Carla Flowers is a 86 y.o. female with PAF, SVT who presents with lightheadedness.  History of Present Illness:   Patient reports that she has had at least 3 episodes tonight of sensation of "buzzing" in her head followed by racing heartbeat.  Patient was able to check her blood pressure and heart rate with a monitor during these episodes.  Heart rate was in the 180s and blood pressure was in the 89F systolic.  Patient reports that these episodes last for approximately 30 seconds to 1 minute and then improved.  In the ED, patient was in NSR with HR in the 60s, BP 110/50.  Labs unremarkable except troponin 75.  Heart Pathway Score:      Past Medical History:  Diagnosis Date  . A-fib ([place])   . Asymmetric septal hypertrophy (HCC)   . Basal cell carcinoma    nose  . GERD (gastroesophageal reflux disease)   . H/O: rheumatic fever   . Hyperlipidemia   . Mitral valve prolapse   . Seizure ([place])   . SVT (supraventricular tachycardia) (HCC)    documented short RP tachycardia, previously adenosine sensitive    Past Surgical History:  Procedure Laterality Date  . ABDOMINAL HYSTERECTOMY    . BASAL CELL CARCINOMA EXCISION     nose     Medications Prior to Admission: Prior to Admission medications   Medication Sig Start Date End Date Taking? Authorizing Provider  Coenzyme Q10 (COQ-10) 100 MG CAPS Take 1 capsule by mouth daily.   Yes [provider]  diltiazem (CARDIZEM) 60 MG tablet TAKE 1-2 TABLETS DAILY AS NEEDED FOR PALPITATIONS Patient taking differently: Take 60-120 mg by mouth as needed (for palpitations).  04/26/19  Yes Allred, Jeneen Rinks, MD  diltiazem (TIAZAC) 120 MG  24 hr capsule TAKE 1 CAPSULE(120 MG) BY MOUTH DAILY Patient taking differently: Take 120 mg by mouth daily.  09/21/19  Yes Allred, Jeneen Rinks, MD  ELIQUIS 5 MG TABS tablet TAKE 1 TABLET(5 MG) BY MOUTH TWICE DAILY 04/12/20  Yes Allred, Jeneen Rinks, MD  famotidine (PEPCID) 20 MG tablet TAKE 1 TABLET(20 MG) BY MOUTH TWICE DAILY Patient taking differently: Take 20 mg by mouth 2 (two) times daily.  04/12/20  Yes Thornton Park, MD  levETIRAcetam (KEPPRA) 500 MG tablet Take 500 mg by mouth Twice daily. 12/10/11  Yes [provider]  pantoprazole (PROTONIX) 40 MG tablet Take 40 mg by mouth at bedtime.  07/31/19  Yes [provider]  rosuvastatin (CRESTOR) 20 MG tablet Take 20 mg by mouth at bedtime. 10/06/19  Yes [provider]  vitamin B-12 (CYANOCOBALAMIN) 1000 MCG tablet Take 1,000 mcg by mouth daily.   Yes [provider]     Allergies:    Allergies  Allergen Reactions  . Sulfa Antibiotics Other (See Comments)    'couldn't breathe good'  . Amoxicillin Other (See Comments)    Tremors  . Codeine Other (See Comments)    unknown  . Cortisone Hives and Other (See Comments)    Childhood reaction Unknown   . Macrobid [Nitrofurantoin] Other (See Comments)    'achy all over', chills   . Sulfonamide Derivatives Other (See Comments)    unknown  Social History:   Social History   Socioeconomic History  . Marital status: Married    Spouse name: Not on file  . Number of children: 4  . Years of education: Not on file  . Highest education level: Not on file  Occupational History  . Occupation: retired  Tobacco Use  . Smoking status: Never Smoker  . Smokeless tobacco: Never Used  Vaping Use  . Vaping Use: Never used  Substance and Sexual Activity  . Alcohol use: No  . Drug use: No  . Sexual activity: Not on file  Other Topics Concern  . Not on file  Social History Narrative  . Not on file   Social Determinants of Health   Financial Resource Strain:   .  Difficulty of Paying Living Expenses:   Food Insecurity:   . Worried About Charity fundraiser in the Last Year:   . Arboriculturist in the Last Year:   Transportation Needs:   . Film/video editor (Medical):   Marland Kitchen Lack of Transportation (Non-Medical):   Physical Activity:   . Days of Exercise per Week:   . Minutes of Exercise per Session:   Stress:   . Feeling of Stress :   Social Connections:   . Frequency of Communication with Friends and Family:   . Frequency of Social Gatherings with Friends and Family:   . Attends Religious Services:   . Active Member of Clubs or Organizations:   . Attends Archivist Meetings:   Marland Kitchen Marital Status:   Intimate Partner Violence:   . Fear of Current or Ex-Partner:   . Emotionally Abused:   Marland Kitchen Physically Abused:   . Sexually Abused:      Family History:   The patient's family history includes Atrial fibrillation in her sister; Heart attack in her mother; Prostate cancer in her father; Stroke in her mother.    ROS:  Please see the history of present illness.  All other ROS reviewed and negative.     Physical Exam/Data:   Vitals:   05/25/20 2337 05/26/20 0149 05/26/20 0152 05/26/20 0300  BP: (!) 109/61 (!) 103/64  (!) 108/47  Pulse: 87 65  64  Resp: 14 20  17   Temp: 97.9 F (36.6 C)     TempSrc: Oral     SpO2: 91% 94%  95%  Weight:   71.2 kg   Height:   5\' 5"  (1.651 m)    No intake or output data in the 24 hours ending 05/26/20 0416 Last 3 Weights 05/26/2020 12/29/2019 11/13/2019  Weight (lbs) 157 lb 163 lb 168 lb  Weight (kg) 71.215 kg 73.936 kg 76.204 kg     Body mass index is 26.13 kg/m.  Wt Readings from Last 3 Encounters:  05/26/20 71.2 kg  12/29/19 73.9 kg  11/13/19 76.2 kg    Physical Exam: General: Well developed, well nourished, in no acute distress. Head: Normocephalic, atraumatic, sclera non-icteric, no xanthomas, nares are without discharge.  Neck: Negative for carotid bruits. JVD not elevated. Lungs:  Clear bilaterally to auscultation without wheezes, rales, or rhonchi. Breathing is unlabored. Heart: RRR with S1 S2. No murmurs, rubs, or gallops appreciated. Abdomen: Soft, non-tender, non-distended with normoactive bowel sounds. No hepatomegaly. No rebound/guarding. No obvious abdominal masses. Msk:  Strength and tone appear normal for age. Extremities: No clubbing or cyanosis. No edema.  Distal pedal pulses are 2+ and equal bilaterally. Neuro: Alert and oriented X 3. No focal deficit. No facial  asymmetry. Moves all extremities spontaneously. Psych:  Responds to questions appropriately with a normal affect.    EKG:  The ECG that was done demonstrates NSR  Relevant CV Studies: None  Laboratory Data:  High Sensitivity Troponin:   Recent Labs  Lab 05/26/20 0201  TROPONINIHS 75*      Cardiac EnzymesNo results for input(s): TROPONINI in the last 168 hours. No results for input(s): TROPIPOC in the last 168 hours.  Chemistry Recent Labs  Lab 05/25/20 2342  NA 139  K 4.4  CL 105  CO2 26  GLUCOSE 146*  BUN 18  CREATININE 1.28*  CALCIUM 9.4  GFRNONAA 38*  GFRAA 44*  ANIONGAP 8    No results for input(s): PROT, ALBUMIN, AST, ALT, ALKPHOS, BILITOT in the last 168 hours. Hematology Recent Labs  Lab 05/25/20 2342  WBC 11.2*  RBC 4.88  HGB 14.8  HCT 46.2*  MCV 94.7  MCH 30.3  MCHC 32.0  RDW 12.6  PLT 273   BNPNo results for input(s): BNP, PROBNP in the last 168 hours.  DDimer No results for input(s): DDIMER in the last 168 hours.   Radiology/Studies:  DG Chest 2 View  Result Date: 05/26/2020 CLINICAL DATA:  Chest pain EXAM: CHEST - 2 VIEW COMPARISON:  None. FINDINGS: Aortic calcifications are noted. The heart size is normal. There is no pneumothorax or large pleural effusion. Hazy bibasilar airspace opacities are noted and are favored to represent atelectasis and scarring as seen on the patient's recent CT of the abdomen. There is no acute osseous abnormality.  IMPRESSION: No active cardiopulmonary disease. Electronically Signed   By: Constance Holster M.D.   On: 05/26/2020 00:14    Assessment and Plan  1. Palpitations, presyncope In sinus rhythm on arrival and no tracings available of possible arrhythmia.  Plan observation on tele, echocardiogram (most recent 2015), and likely discharge home with Holter. -- echo ordered  2. PAF Continue apixaban and diltiazem  3. Elevated troponin Suspect demand ischemia vs chronic troponin elevation.  Repeat pending.   Severity of Illness: The appropriate patient status for this patient is OBSERVATION. Observation status is judged to be reasonable and necessary in order to provide the required intensity of service to ensure the patient's safety. The patient's presenting symptoms, physical exam findings, and initial radiographic and laboratory data in the context of their medical condition is felt to place them at decreased risk for further clinical deterioration. Furthermore, it is anticipated that the patient will be medically stable for discharge from the hospital within 2 midnights of admission. The following factors support the patient status of observation.   " The patient's presenting symptoms include presyncope. " The physical exam findings include none. " The initial radiographic and laboratory data are abnormal with elevated troponin.     For questions or updates, please contact [place] Please consult [email or web address] for contact info under      Signed, Azalia Neuberger S, MD 05/26/2020, 4:16 AM

## 2020-05-26 NOTE — ED Notes (Signed)
Breakfast tray provided. 

## 2020-05-26 NOTE — ED Notes (Signed)
Regular lunch tray ordered 

## 2020-05-26 NOTE — Progress Notes (Signed)
*  PRELIMINARY RESULTS* Echocardiogram 2D Echocardiogram has been performed.  Leavy Cella 05/26/2020, 12:24 PM

## 2020-06-03 ENCOUNTER — Other Ambulatory Visit: Payer: Self-pay

## 2020-06-03 ENCOUNTER — Encounter: Payer: Self-pay | Admitting: Internal Medicine

## 2020-06-03 ENCOUNTER — Ambulatory Visit: Payer: Medicare Other | Admitting: Internal Medicine

## 2020-06-03 VITALS — BP 110/62 | HR 60 | Ht 65.0 in | Wt 162.0 lb

## 2020-06-03 DIAGNOSIS — I480 Paroxysmal atrial fibrillation: Secondary | ICD-10-CM | POA: Diagnosis not present

## 2020-06-03 DIAGNOSIS — I471 Supraventricular tachycardia, unspecified: Secondary | ICD-10-CM

## 2020-06-03 MED ORDER — DILTIAZEM HCL ER COATED BEADS 180 MG PO CP24
180.0000 mg | ORAL_CAPSULE | Freq: Every day | ORAL | 11 refills | Status: DC
Start: 2020-06-03 — End: 2021-05-20

## 2020-06-03 NOTE — Patient Instructions (Addendum)
Medication Instructions:  Your physician has recommended you make the following change in your medication:  1 Start Cardizem CD 180 mg. Take 1 tablet by mouth daily.    *If you need a refill on your cardiac medications before your next appointment, please call your pharmacy*  Lab Work: None ordered.  If you have labs (blood work) drawn today and your tests are completely normal, you will receive your results only by: Marland Kitchen MyChart Message (if you have MyChart) OR . A paper copy in the mail If you have any lab test that is abnormal or we need to change your treatment, we will call you to review the results.  Testing/Procedures: None ordered.  Follow-Up: At [facility], you and your health needs are our priority.  As part of our continuing mission to provide you with exceptional heart care, we have created designated Provider Care Teams.  These Care Teams include your primary Cardiologist (physician) and Advanced Practice Providers (APPs -  Physician Assistants and Nurse Practitioners) who all work together to provide you with the care you need, when you need it.  We recommend signing up for the patient portal called "MyChart".  Sign up information is provided on this After Visit Summary.  MyChart is used to connect with patients for Virtual Visits (Telemedicine).  Patients are able to view lab/test results, encounter notes, upcoming appointments, etc.  Non-urgent messages can be sent to your provider as well.   To learn more about what you can do with MyChart, go to [email or web address].    Your next appointment:   Your physician wants you to follow-up in: 07/10/2020 at 9:15   Other Instructions:

## 2020-06-03 NOTE — Progress Notes (Signed)
PCP: [name], MD   Primary EP: Dr Corbin Ade is a 86 y.o. female who presents today for routine electrophysiology followup.  Since last being seen in our clinic, the patient reports doing reasonably well. She recently developed abrupt onset of tachypalpitations for which she was observed overnight at the ED.  Her symptoms resolved upon presentation and EKG was normal.  She has had no further episodes.   Today, she denies symptoms of palpitations, chest pain, shortness of breath,  lower extremity edema, dizziness, presyncope, or syncope.  The patient is otherwise without complaint today.   Past Medical History:  Diagnosis Date  . A-fib ([place])   . Asymmetric septal hypertrophy (HCC)   . Basal cell carcinoma    nose  . GERD (gastroesophageal reflux disease)   . H/O: rheumatic fever   . Hyperlipidemia   . Mitral valve prolapse   . Seizure ([place])   . SVT (supraventricular tachycardia) (HCC)    documented short RP tachycardia, previously adenosine sensitive   Past Surgical History:  Procedure Laterality Date  . ABDOMINAL HYSTERECTOMY    . BASAL CELL CARCINOMA EXCISION     nose    ROS- all systems are reviewed and negatives except as per HPI above  Current Outpatient Medications  Medication Sig Dispense Refill  . Coenzyme Q10 (COQ-10) 100 MG CAPS Take 1 capsule by mouth daily.    Marland Kitchen diltiazem (CARDIZEM) 60 MG tablet TAKE 1-2 TABLETS DAILY AS NEEDED FOR PALPITATIONS 30 tablet 6  . diltiazem (TIAZAC) 120 MG 24 hr capsule TAKE 1 CAPSULE(120 MG) BY MOUTH DAILY 90 capsule 2  . ELIQUIS 5 MG TABS tablet TAKE 1 TABLET(5 MG) BY MOUTH TWICE DAILY 60 tablet 5  . famotidine (PEPCID) 20 MG tablet TAKE 1 TABLET(20 MG) BY MOUTH TWICE DAILY 60 tablet 3  . levETIRAcetam (KEPPRA) 500 MG tablet Take 500 mg by mouth Twice daily.    . pantoprazole (PROTONIX) 40 MG tablet Take 40 mg by mouth at bedtime.     . rosuvastatin (CRESTOR) 20 MG tablet Take 20 mg by mouth at bedtime.    .  vitamin B-12 (CYANOCOBALAMIN) 1000 MCG tablet Take 1,000 mcg by mouth daily.     No current facility-administered medications for this visit.    Physical Exam: Vitals:   06/03/20 1418  BP: 110/62  Pulse: 60  SpO2: 90%  Weight: 162 lb (73.5 kg)  Height: 5\' 5"  (1.651 m)    GEN- The patient is well appearing, alert and oriented x 3 today.   Head- normocephalic, atraumatic Eyes-  Sclera clear, conjunctiva pink Ears- hearing intact Oropharynx- clear Lungs-   normal work of breathing Heart- Regular rate and rhythm  GI- soft  Extremities- no clubbing, cyanosis, or edema  Wt Readings from Last 3 Encounters:  06/03/20 162 lb (73.5 kg)  05/26/20 157 lb (71.2 kg)  12/29/19 163 lb (73.9 kg)   Recent ekgs and ed notes (including general cardiology consult note are reviewed) I have also discussed with Dr Melford Aase  Assessment and Plan:  1. Paroxysmal atrial fibrillation/ SVT Recent episode resulting in ED visit noted No arrhythmias at presentation to the ED.  She has both known AF and short RP SVT chads2vasc score is 3.  She is on eliquis Increase diltiazem CD to 180mg  daily at this time Could consider amiodarone or flecainide however given first degree AV block, we would need to be very cautious  Risks, benefits and potential toxicities for medications prescribed  and/or refilled reviewed with patient today.   Return to see me in 4-6 weeks in office for repeat ekg and further evaluation.  Thompson Grayer MD, [facility] 06/03/2020 2:37 PM

## 2020-07-10 ENCOUNTER — Ambulatory Visit: Payer: Medicare Other | Admitting: Internal Medicine

## 2020-07-29 ENCOUNTER — Ambulatory Visit (HOSPITAL_COMMUNITY)
Admission: RE | Admit: 2020-07-29 | Discharge: 2020-07-29 | Disposition: A | Discharge status: Home / Self Care | Payer: Medicare Other | Source: Ambulatory Visit | Attending: Physician Assistant | Admitting: Physician Assistant

## 2020-07-29 ENCOUNTER — Other Ambulatory Visit: Payer: Self-pay

## 2020-07-29 ENCOUNTER — Telehealth: Payer: Self-pay | Admitting: Internal Medicine

## 2020-07-29 ENCOUNTER — Encounter (HOSPITAL_COMMUNITY): Payer: Self-pay | Admitting: Physician Assistant

## 2020-07-29 VITALS — BP 102/60 | HR 124 | Ht 65.0 in | Wt 161.8 lb

## 2020-07-29 DIAGNOSIS — E785 Hyperlipidemia, unspecified: Secondary | ICD-10-CM | POA: Diagnosis not present

## 2020-07-29 DIAGNOSIS — K219 Gastro-esophageal reflux disease without esophagitis: Secondary | ICD-10-CM | POA: Diagnosis not present

## 2020-07-29 DIAGNOSIS — Z79899 Other long term (current) drug therapy: Secondary | ICD-10-CM | POA: Diagnosis not present

## 2020-07-29 DIAGNOSIS — Z888 Allergy status to other drugs, medicaments and biological substances status: Secondary | ICD-10-CM | POA: Insufficient documentation

## 2020-07-29 DIAGNOSIS — Z8249 Family history of ischemic heart disease and other diseases of the circulatory system: Secondary | ICD-10-CM | POA: Diagnosis not present

## 2020-07-29 DIAGNOSIS — I484 Atypical atrial flutter: Secondary | ICD-10-CM | POA: Diagnosis not present

## 2020-07-29 DIAGNOSIS — Z881 Allergy status to other antibiotic agents status: Secondary | ICD-10-CM | POA: Insufficient documentation

## 2020-07-29 DIAGNOSIS — Z823 Family history of stroke: Secondary | ICD-10-CM | POA: Diagnosis not present

## 2020-07-29 DIAGNOSIS — Z7901 Long term (current) use of anticoagulants: Secondary | ICD-10-CM | POA: Insufficient documentation

## 2020-07-29 DIAGNOSIS — Z882 Allergy status to sulfonamides status: Secondary | ICD-10-CM | POA: Insufficient documentation

## 2020-07-29 DIAGNOSIS — Z85828 Personal history of other malignant neoplasm of skin: Secondary | ICD-10-CM | POA: Diagnosis not present

## 2020-07-29 DIAGNOSIS — I4819 Other persistent atrial fibrillation: Secondary | ICD-10-CM | POA: Insufficient documentation

## 2020-07-29 DIAGNOSIS — I480 Paroxysmal atrial fibrillation: Secondary | ICD-10-CM | POA: Diagnosis not present

## 2020-07-29 DIAGNOSIS — I471 Supraventricular tachycardia: Secondary | ICD-10-CM | POA: Insufficient documentation

## 2020-07-29 DIAGNOSIS — D6869 Other thrombophilia: Secondary | ICD-10-CM | POA: Diagnosis not present

## 2020-07-29 LAB — BASIC METABOLIC PANEL
Anion gap: 9 (ref 5–15)
BUN: 11 mg/dL (ref 8–23)
CO2: 26 mmol/L (ref 22–32)
Calcium: 9.5 mg/dL (ref 8.9–10.3)
Chloride: 106 mmol/L (ref 98–111)
Creatinine, Ser: 1.3 mg/dL — ABNORMAL HIGH (ref 0.44–1.00)
GFR calc Af Amer: 43 mL/min — ABNORMAL LOW (ref >60)
GFR calc non Af Amer: 37 mL/min — ABNORMAL LOW (ref >60)
Glucose, Bld: 196 mg/dL — ABNORMAL HIGH (ref 70–99)
Potassium: 4.4 mmol/L (ref 3.5–5.1)
Sodium: 141 mmol/L (ref 135–145)

## 2020-07-29 LAB — MAGNESIUM: Magnesium: 2.1 mg/dL (ref 1.7–2.4)

## 2020-07-29 MED ORDER — FLECAINIDE ACETATE 50 MG PO TABS
50.0000 mg | ORAL_TABLET | Freq: Two times a day (BID) | ORAL | 3 refills | Status: DC
Start: 2020-07-29 — End: 2020-08-01

## 2020-07-29 NOTE — Progress Notes (Signed)
Primary Care Physician: [name], MD Primary Electrophysiologist: Dr [name] Referring Physician: [facility] triage/Dr Allred   Carla Flowers is a 86 y.o. female with a history of paroxysmal atrial fibrillation, HLD, SVT, rheumatic fever, MVP who presents for follow up in the [facility]. The patient was initially diagnosed with atrial fibrillation 12/09/17 after presenting to the ED with symptoms of heart racing. Patient is on Eliquis for a CHADS2VASC score of 3. She has been followed by Dr [name] for both SVT and afib. She has declined ablation for SVT in the past. She reports that overnight on 07/28/20 she had symptoms of heart racing. She has taken doses of her PRN diltiazem with only mild improvement in her heart rate. She did have several episodes of diarrhea prior to the onset of her palpitations. The diarrhea has now resolved.  Today, she denies symptoms of chest pain, shortness of breath, orthopnea, PND, lower extremity edema, dizziness, presyncope, syncope, snoring, daytime somnolence, bleeding, or neurologic sequela. The patient is tolerating medications without difficulties and is otherwise without complaint today.    Atrial Fibrillation Risk Factors:  she does not have symptoms or diagnosis of sleep apnea. she does have a history of rheumatic fever. she does not have a history of alcohol use.  she has a BMI of Body mass index is 26.92 kg/m.Marland Kitchen Filed Weights   07/29/20 0958  Weight: 73.4 kg    Family History  Problem Relation Age of Onset   Heart attack Mother    Stroke Mother    Prostate cancer Father    Atrial fibrillation Sister      Atrial Fibrillation Management history:  Previous antiarrhythmic drugs: none Previous cardioversions: none Previous ablations: none CHADS2VASC score: 3 Anticoagulation history: Eliquis   Past Medical History:  Diagnosis Date   A-fib ([place])    Asymmetric septal hypertrophy (HCC)     Basal cell carcinoma    nose   GERD (gastroesophageal reflux disease)    H/O: rheumatic fever    Hyperlipidemia    Mitral valve prolapse    Seizure (HCC)    SVT (supraventricular tachycardia) ([place])    documented short RP tachycardia, previously adenosine sensitive   Past Surgical History:  Procedure Laterality Date   ABDOMINAL HYSTERECTOMY     BASAL CELL CARCINOMA EXCISION     nose    Current Outpatient Medications  Medication Sig Dispense Refill   Coenzyme Q10 (COQ-10) 100 MG CAPS Take 1 capsule by mouth daily.     diltiazem (CARDIZEM CD) 180 MG 24 hr capsule Take 1 capsule (180 mg total) by mouth daily. 30 capsule 11   diltiazem (CARDIZEM) 60 MG tablet TAKE 1-2 TABLETS DAILY AS NEEDED FOR PALPITATIONS 30 tablet 6   ELIQUIS 5 MG TABS tablet TAKE 1 TABLET(5 MG) BY MOUTH TWICE DAILY 60 tablet 5   famotidine (PEPCID) 20 MG tablet TAKE 1 TABLET(20 MG) BY MOUTH TWICE DAILY 60 tablet 3   levETIRAcetam (KEPPRA) 500 MG tablet Take 500 mg by mouth Twice daily.     pantoprazole (PROTONIX) 40 MG tablet Take 40 mg by mouth at bedtime.      rosuvastatin (CRESTOR) 20 MG tablet Take 20 mg by mouth at bedtime.     vitamin B-12 (CYANOCOBALAMIN) 1000 MCG tablet Take 1,000 mcg by mouth daily.     flecainide (TAMBOCOR) 50 MG tablet Take 1 tablet (50 mg total) by mouth 2 (two) times daily. 60 tablet 3   No current facility-administered medications  for this encounter.    Allergies  Allergen Reactions   Sulfa Antibiotics Other (See Comments)    'couldn't breathe good'   Amoxicillin Other (See Comments)    Tremors   Codeine Other (See Comments)    unknown   Cortisone Hives and Other (See Comments)    Childhood reaction Unknown    Macrobid [Nitrofurantoin] Other (See Comments)    'achy all over', chills    Sulfonamide Derivatives Other (See Comments)    unknown    Social History   Socioeconomic History   Marital status: Married    Spouse name: Not on file    Number of children: 4   Years of education: Not on file   Highest education level: Not on file  Occupational History   Occupation: retired  Tobacco Use   Smoking status: Never Smoker   Smokeless tobacco: Never Used  Scientific laboratory technician Use: Never used  Substance and Sexual Activity   Alcohol use: No   Drug use: No   Sexual activity: Not on file  Other Topics Concern   Not on file  Social History Narrative   Not on file   Social Determinants of Health   Financial Resource Strain:    Difficulty of Paying Living Expenses: Not on file  Food Insecurity:    Worried About Charity fundraiser in the Last Year: Not on file   [place] in the Last Year: Not on file  Transportation Needs:    Lack of Transportation (Medical): Not on file   Lack of Transportation (Non-Medical): Not on file  Physical Activity:    Days of Exercise per Week: Not on file   Minutes of Exercise per Session: Not on file  Stress:    Feeling of Stress : Not on file  Social Connections:    Frequency of Communication with Friends and Family: Not on file   Frequency of Social Gatherings with Friends and Family: Not on file   Attends Religious Services: Not on file   Active Member of Clubs or Organizations: Not on file   Attends Archivist Meetings: Not on file   Marital Status: Not on file  Intimate Partner Violence:    Fear of Current or Ex-Partner: Not on file   Emotionally Abused: Not on file   Physically Abused: Not on file   Sexually Abused: Not on file     ROS- All systems are reviewed and negative except as per the HPI above.  Physical Exam: Vitals:   07/29/20 0958  BP: 102/60  Pulse: (!) 124  Weight: 73.4 kg  Height: 5\' 5"  (1.651 m)    GEN- The patient is well appearing elderly female, alert and oriented x 3 today.   Head- normocephalic, atraumatic Eyes-  Sclera clear, conjunctiva pink Ears- hearing intact Oropharynx- clear Neck- supple    Lungs- Clear to ausculation bilaterally, normal work of breathing Heart- Regular rate and rhythm, tachycardic, no murmurs, rubs or gallops  GI- soft, NT, ND, + BS Extremities- no clubbing, cyanosis, or edema MS- no significant deformity or atrophy Skin- no rash or lesion Psych- euthymic mood, full affect Neuro- strength and sensation are intact  Wt Readings from Last 3 Encounters:  07/29/20 73.4 kg  06/03/20 73.5 kg  05/26/20 71.2 kg    EKG today demonstrates SVT vs atypical atrial flutter HR 124, PR 124, QRS 112, QTc 479 (reviewed with Dr [name], most likely atrial flutter)  Echo 05/26/20 demonstrated  1.  Left ventricular ejection fraction, by estimation, is 60 to 65%. The  left ventricle has normal function. The left ventricle has no regional  wall motion abnormalities. There is moderate left ventricular hypertrophy.  Left ventricular diastolic  parameters are consistent with Grade II diastolic dysfunction  (pseudonormalization). Elevated left ventricular end-diastolic pressure.  2. Right ventricular systolic function is normal. The right ventricular  size is normal. There is normal pulmonary artery systolic pressure.  3. Left atrial size was moderately dilated.  4. The mitral valve is abnormal. Mild mitral valve regurgitation.  5. The aortic valve is tricuspid. Aortic valve regurgitation is trivial.  Mild aortic valve sclerosis is present, with no evidence of aortic valve  stenosis.  6. The inferior vena cava is normal in size with greater than 50%  respiratory variability, suggesting right atrial pressure of 3 mmHg.   Epic records are reviewed at length today  CHA2DS2-VASc Score = 3  The patient's score is based upon: CHF History: 0 HTN History: 0 Age : 4 Diabetes History: 0 Stroke History: 0 Vascular Disease History: 0 Gender: 1      ASSESSMENT AND PLAN: 1. Paroxysmal Atrial Fibrillation/SVT/atypical atrial flutter The patient's CHA2DS2-VASc score is 3,  indicating a 3.2% annual risk of stroke. We discussed therapeutic options today, Dr Jackalyn Lombard past recommendations reviewed. We discussed flecainide and amiodarone. Will plan to start flecainide 50 mg BID. Will monitor PR interval closely.  Continue diltiazem 180 mg daily with 60 mg PRN for heart racing. Continue Eliquis 5 mg BID Check bmet/mag  2. Secondary Hypercoagulable State (ICD10:  D68.69) The patient is at significant risk for stroke/thromboembolism based upon her CHA2DS2-VASc Score of 3.  Continue Apixaban (Eliquis).    Follow up in the AF clinic later this week for ECG.   [facility] [address] [place], [place] [address] [phone] 07/29/2020 12:04 PM

## 2020-07-29 NOTE — Telephone Encounter (Signed)
Called and spoke to the patient. She states that she has had intermittent episodes of palpitations overnight and this morning. She states that her HR has been as high as 155 bpm. She takes diltiazem 180 MG QD. She also takes diltiazem 60 mg PRN. She denies chest pain, SOB, dizziness, or any other Sx at this time. BP 118/91, HR 137. She is requesting an appointment. Appointment made in the [facility] today at 9:45 AM with Adline Peals, PA.

## 2020-07-29 NOTE — Telephone Encounter (Signed)
Patient c/o Palpitations:  High priority if patient c/o lightheadedness, shortness of breath, or chest pain  1) How long have you had palpitations/irregular HR/ Afib? Are you having the symptoms now?   2) Are you currently experiencing lightheadedness, SOB or CP? no  3) Do you have a history of afib (atrial fibrillation) or irregular heart rhythm? Yes AFIB  4) Have you checked your BP or HR? (document readings if available): BP 107/97 HR 155 patient took medication and it came down but the went back up.  5) Are you experiencing any other symptoms? Just to the restroom on yesterday.

## 2020-07-29 NOTE — Patient Instructions (Signed)
Start Flecainide 50mg twice a day 

## 2020-08-01 ENCOUNTER — Ambulatory Visit (HOSPITAL_COMMUNITY)
Admission: RE | Admit: 2020-08-01 | Discharge: 2020-08-01 | Disposition: A | Discharge status: Home / Self Care | Payer: Medicare Other | Source: Ambulatory Visit | Attending: Physician Assistant | Admitting: Physician Assistant

## 2020-08-01 ENCOUNTER — Other Ambulatory Visit: Payer: Self-pay

## 2020-08-01 VITALS — BP 128/62 | HR 64

## 2020-08-01 DIAGNOSIS — I440 Atrioventricular block, first degree: Secondary | ICD-10-CM | POA: Insufficient documentation

## 2020-08-01 DIAGNOSIS — Z79899 Other long term (current) drug therapy: Secondary | ICD-10-CM | POA: Insufficient documentation

## 2020-08-01 DIAGNOSIS — Z5181 Encounter for therapeutic drug level monitoring: Secondary | ICD-10-CM | POA: Diagnosis not present

## 2020-08-01 DIAGNOSIS — I484 Atypical atrial flutter: Secondary | ICD-10-CM

## 2020-08-01 MED ORDER — FLECAINIDE ACETATE 50 MG PO TABS
25.0000 mg | ORAL_TABLET | Freq: Two times a day (BID) | ORAL | 3 refills | Status: DC
Start: 2020-08-01 — End: 2020-08-29

## 2020-08-01 NOTE — Progress Notes (Signed)
Patient returns for ECG after starting flecainide. ECG shows SR HR 64, 1st degree AV block, LVH with repolarization changes, T wave changes more pronounced, PR 290, QRS 140, QTc 470. Patient reports that she feels well with no perceived side effects. She has not had any further heart racing. Reviewed with Dr [name]. Will reduce flecainide to 25 mg BID and recheck ECG next week.

## 2020-08-05 ENCOUNTER — Ambulatory Visit (HOSPITAL_COMMUNITY)
Admission: RE | Admit: 2020-08-05 | Discharge: 2020-08-05 | Disposition: A | Discharge status: Home / Self Care | Payer: Medicare Other | Source: Ambulatory Visit | Attending: Physician Assistant | Admitting: Physician Assistant

## 2020-08-05 ENCOUNTER — Other Ambulatory Visit: Payer: Self-pay

## 2020-08-05 VITALS — BP 124/62 | HR 63

## 2020-08-05 DIAGNOSIS — I484 Atypical atrial flutter: Secondary | ICD-10-CM

## 2020-08-05 DIAGNOSIS — Z79899 Other long term (current) drug therapy: Secondary | ICD-10-CM | POA: Diagnosis not present

## 2020-08-05 DIAGNOSIS — I440 Atrioventricular block, first degree: Secondary | ICD-10-CM | POA: Insufficient documentation

## 2020-08-05 DIAGNOSIS — Z5181 Encounter for therapeutic drug level monitoring: Secondary | ICD-10-CM | POA: Diagnosis present

## 2020-08-05 DIAGNOSIS — D6869 Other thrombophilia: Secondary | ICD-10-CM

## 2020-08-05 NOTE — Progress Notes (Signed)
Patient returns for ECG today after decreasing flecainide. ECG shows SR HR 63, 1st degree AV block, LVH with repolarization changes, PR 238, QRS 130, QTc 452. PR and QRS both shortened on 25 mg BID of flecainide. She did have an episode of heart racing on 10/2 which lasted several hours. If the lower dose of flecainide fails to control her symptoms, she may need alternate AAD. Follow up with Dr [name] as scheduled.

## 2020-08-08 ENCOUNTER — Telehealth: Payer: Self-pay | Admitting: Internal Medicine

## 2020-08-08 NOTE — Telephone Encounter (Signed)
BP 111/75 HR 81  Been fluctuating for several days. Feeling like heart racing in her chest this morning, more fatigued and had to sit down. HR after sitting down ~130 Used PRN diltiazem yesterday.  Patient is not feeling well and concerned that she needs to see Audry Pili at the [place] [facility] before her follow up test which she says is Monday (I did not see this appt). Advised the patient that I would forward this to Craig and his nurse Marzetta Board for advisement.   Patient verbalized understanding of plan.

## 2020-08-08 NOTE — Telephone Encounter (Signed)
Called the patient and discussed her medications in detail and what they are use for, her HR and BP readings and when to take them in regards to medication. Advised her to use the PRN Diltiazem as directed for her elevated HR. Asked her to take her HR and BP twice a day, once in the morning (~hour after morning medications) and once at bedtime and bring those readings to her appointment on Monday. Advised that her appointment is with Dr. [name] Monday and not in the afib clinic.    The patient verbalized understanding.

## 2020-08-08 NOTE — Telephone Encounter (Signed)
STAT if HR is under 50 or over 120 (normal HR is 60-100 beats per minute)  1) What is your heart rate? 115  2) Do you have a log of your heart rate readings (document readings)? 115, 97, 112, 68, 62  3) Do you have any other symptoms? No

## 2020-08-12 ENCOUNTER — Ambulatory Visit: Payer: Medicare Other | Admitting: Internal Medicine

## 2020-08-12 ENCOUNTER — Encounter: Payer: Self-pay | Admitting: Internal Medicine

## 2020-08-12 ENCOUNTER — Ambulatory Visit (INDEPENDENT_AMBULATORY_CARE_PROVIDER_SITE_OTHER): Payer: Medicare Other | Admitting: Internal Medicine

## 2020-08-12 ENCOUNTER — Other Ambulatory Visit: Payer: Self-pay

## 2020-08-12 VITALS — BP 126/82 | HR 81 | Ht 65.0 in | Wt 165.0 lb

## 2020-08-12 DIAGNOSIS — D6869 Other thrombophilia: Secondary | ICD-10-CM

## 2020-08-12 DIAGNOSIS — I471 Supraventricular tachycardia: Secondary | ICD-10-CM | POA: Diagnosis not present

## 2020-08-12 DIAGNOSIS — I480 Paroxysmal atrial fibrillation: Secondary | ICD-10-CM

## 2020-08-12 MED ORDER — AMIODARONE HCL 200 MG PO TABS
200.0000 mg | ORAL_TABLET | Freq: Every day | ORAL | 3 refills | Status: DC
Start: 2020-08-12 — End: 2020-12-11

## 2020-08-12 NOTE — Progress Notes (Signed)
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 female who presents today for routine electrophysiology followup.  She has returned to afib/ atrial flutter with low dose flecainide.  She has had tachypalpitations for 2 weeks.  + SOB and reduced exercise tolerance.  Today, she denies symptoms of chest pain,   lower extremity edema, dizziness, presyncope, or syncope.  The patient is otherwise without complaint today.   Past Medical History:  Diagnosis Date  . A-fib ([place])   . Asymmetric septal hypertrophy (HCC)   . Basal cell carcinoma    nose  . GERD (gastroesophageal reflux disease)   . H/O: rheumatic fever   . Hyperlipidemia   . Mitral valve prolapse   . Seizure ([place])   . SVT (supraventricular tachycardia) (HCC)    documented short RP tachycardia, previously adenosine sensitive   Past Surgical History:  Procedure Laterality Date  . ABDOMINAL HYSTERECTOMY    . BASAL CELL CARCINOMA EXCISION     nose    ROS- all systems are reviewed and negatives except as per HPI above  Current Outpatient Medications  Medication Sig Dispense Refill  . Coenzyme Q10 (COQ-10) 100 MG CAPS Take 1 capsule by mouth daily.    Marland Kitchen diltiazem (CARDIZEM CD) 180 MG 24 hr capsule Take 1 capsule (180 mg total) by mouth daily. 30 capsule 11  . diltiazem (CARDIZEM) 60 MG tablet TAKE 1-2 TABLETS DAILY AS NEEDED FOR PALPITATIONS 30 tablet 6  . ELIQUIS 5 MG TABS tablet TAKE 1 TABLET(5 MG) BY MOUTH TWICE DAILY 60 tablet 5  . famotidine (PEPCID) 20 MG tablet TAKE 1 TABLET(20 MG) BY MOUTH TWICE DAILY 60 tablet 3  . flecainide (TAMBOCOR) 50 MG tablet Take 0.5 tablets (25 mg total) by mouth 2 (two) times daily. 60 tablet 3  . levETIRAcetam (KEPPRA) 500 MG tablet Take 500 mg by mouth Twice daily.    . pantoprazole (PROTONIX) 40 MG tablet Take 40 mg by mouth at bedtime.     . rosuvastatin (CRESTOR) 20 MG tablet Take 20 mg by mouth at bedtime.    . vitamin B-12 (CYANOCOBALAMIN) 1000 MCG  tablet Take 1,000 mcg by mouth daily.     No current facility-administered medications for this visit.    Physical Exam: Vitals:   08/12/20 1522  BP: 126/82  Pulse: 81  SpO2: 92%  Weight: 165 lb (74.8 kg)  Height: 5\' 5"  (1.651 m)    GEN- The patient is well appearing, alert and oriented x 3 today.   Head- normocephalic, atraumatic Eyes-  Sclera clear, conjunctiva pink Ears- hearing intact Oropharynx- clear Lungs- Clear to ausculation bilaterally, normal work of breathing Heart- tachycardic irregular rhythm GI- soft, NT, ND, + BS Extremities- no clubbing, cyanosis, or edema  Wt Readings from Last 3 Encounters:  08/12/20 165 lb (74.8 kg)  07/29/20 161 lb 12.8 oz (73.4 kg)  06/03/20 162 lb (73.5 kg)    EKG tracing ordered today is personally reviewed and shows wide complex tachycardia, likely atrial flutter with left bundle branch aberrancy  Assessment and Plan:  1. Paroxysmal atrial fibrillation/ SVT (short RP)/ wide complex arrhythmia chads2vasc score is 3.  She is on eliquis for stroke prevention She recently started flecainide but had to reduce her dose due to chronic AV conduction system disease.  She is now in a wide complex rhythm which is likely aberrant atrial flutter. Stop flecainide Start amiodarone 200mg  BID tomorrow I have advised urgent cardioversion.  She worries about risks of cardioversion due to anesthesia requirement.  She wants to try amiodarone load this week but will consent to cardioversion next week if she does not covert. We discussed risks of anesthesia and stroke with [facility]. If her symptoms worsen, she should go to the ER for urgent cardioversion.  Follow-up in AF clinic 1 week after [facility] I will see again in 6 weeks. If her arrhythmias continue to progress despite amiodarone, we may need to consider AV nodal ablation/ ppm.  Given her advanced age, hopefully we can avoid this.  Risks, benefits and potential toxicities for medications prescribed  and/or refilled reviewed with patient today.   Thompson Grayer MD, [facility] 08/12/2020 3:29 PM

## 2020-08-12 NOTE — Patient Instructions (Addendum)
Medication Instructions:  1 stop Flecainide  2 Start Amiodarone 200 mg twice a day (08/13/20)  *If you need a refill on your cardiac medications before your next appointment, please call your pharmacy*  Lab Work: None ordered.  If you have labs (blood work) drawn today and your tests are completely normal, you will receive your results only by: Marland Kitchen MyChart Message (if you have MyChart) OR . A paper copy in the mail If you have any lab test that is abnormal or we need to change your treatment, we will call you to review the results.  Testing/Procedures: Your physician has recommended that you have a Cardioversion (DCCV). Electrical Cardioversion uses a jolt of electricity to your heart either through paddles or wired patches attached to your chest. This is a controlled, usually prescheduled, procedure. Defibrillation is done under light anesthesia in the hospital, and you usually go home the day of the procedure. This is done to get your heart back into a normal rhythm. You are not awake for the procedure. Please see the instruction sheet given to you today.   Follow-Up: At [facility], you and your health needs are our priority.  As part of our continuing mission to provide you with exceptional heart care, we have created designated Provider Care Teams.  These Care Teams include your primary Cardiologist (physician) and Advanced Practice Providers (APPs -  Physician Assistants and Nurse Practitioners) who all work together to provide you with the care you need, when you need it.  We recommend signing up for the patient portal called "MyChart".  Sign up information is provided on this After Visit Summary.  MyChart is used to connect with patients for Virtual Visits (Telemedicine).  Patients are able to view lab/test results, encounter notes, upcoming appointments, etc.  Non-urgent messages can be sent to your provider as well.   To learn more about what you can do with MyChart, go to  [email or web address].     Other Instructions:   You are scheduled for a Cardioversion on 08/21/2020 with Dr. Eleonore Chiquito.  Please arrive at the [facility] (Main Entrance A) at [facility]: [place], [place] [address] at 9 am.  DIET: Nothing to eat or drink after midnight except a sip of water with medications (see medication instructions below)  Medication Instructions: Take all regular morning medications   Continue your anticoagulant: Eliquis You will need to continue your anticoagulant after your procedure until you  are told by your  Provider that it is safe to stop   You must have a responsible person to drive you home and stay in the waiting area during your procedure. Failure to do so could result in cancellation.  Bring your insurance cards.  *Special Note: Every effort is made to have your procedure done on time. Occasionally there are emergencies that occur at the hospital that may cause delays. Please be patient if a delay does occur.

## 2020-08-17 ENCOUNTER — Other Ambulatory Visit: Payer: Self-pay

## 2020-08-17 ENCOUNTER — Encounter (HOSPITAL_COMMUNITY): Payer: Self-pay | Admitting: Emergency Medicine

## 2020-08-17 ENCOUNTER — Emergency Department (HOSPITAL_COMMUNITY)
Admission: EM | Admit: 2020-08-17 | Discharge: 2020-08-17 | Disposition: A | Discharge status: Home / Self Care | Payer: Medicare Other | Attending: Emergency Medicine | Admitting: Emergency Medicine

## 2020-08-17 ENCOUNTER — Emergency Department (HOSPITAL_COMMUNITY): Payer: Medicare Other

## 2020-08-17 DIAGNOSIS — Z79899 Other long term (current) drug therapy: Secondary | ICD-10-CM | POA: Insufficient documentation

## 2020-08-17 DIAGNOSIS — Z85828 Personal history of other malignant neoplasm of skin: Secondary | ICD-10-CM | POA: Diagnosis not present

## 2020-08-17 DIAGNOSIS — I10 Essential (primary) hypertension: Secondary | ICD-10-CM | POA: Insufficient documentation

## 2020-08-17 DIAGNOSIS — Z7901 Long term (current) use of anticoagulants: Secondary | ICD-10-CM | POA: Insufficient documentation

## 2020-08-17 DIAGNOSIS — Z20822 Contact with and (suspected) exposure to covid-19: Secondary | ICD-10-CM | POA: Diagnosis not present

## 2020-08-17 DIAGNOSIS — R062 Wheezing: Secondary | ICD-10-CM | POA: Diagnosis present

## 2020-08-17 DIAGNOSIS — J181 Lobar pneumonia, unspecified organism: Secondary | ICD-10-CM | POA: Diagnosis not present

## 2020-08-17 DIAGNOSIS — J189 Pneumonia, unspecified organism: Secondary | ICD-10-CM

## 2020-08-17 DIAGNOSIS — I4819 Other persistent atrial fibrillation: Secondary | ICD-10-CM | POA: Diagnosis not present

## 2020-08-17 LAB — CBC WITH DIFFERENTIAL/PLATELET
Abs Immature Granulocytes: 0.04 10*3/uL (ref 0.00–0.07)
Basophils Absolute: 0.1 10*3/uL (ref 0.0–0.1)
Basophils Relative: 1 %
Eosinophils Absolute: 0.1 10*3/uL (ref 0.0–0.5)
Eosinophils Relative: 1 %
HCT: 42.2 % (ref 36.0–46.0)
Hemoglobin: 13.6 g/dL (ref 12.0–15.0)
Immature Granulocytes: 0 %
Lymphocytes Relative: 20 %
Lymphs Abs: 2.1 10*3/uL (ref 0.7–4.0)
MCH: 30.5 pg (ref 26.0–34.0)
MCHC: 32.2 g/dL (ref 30.0–36.0)
MCV: 94.6 fL (ref 80.0–100.0)
Monocytes Absolute: 0.7 10*3/uL (ref 0.1–1.0)
Monocytes Relative: 7 %
Neutro Abs: 7.1 10*3/uL (ref 1.7–7.7)
Neutrophils Relative %: 71 %
Platelets: 273 10*3/uL (ref 150–400)
RBC: 4.46 MIL/uL (ref 3.87–5.11)
RDW: 12.8 % (ref 11.5–15.5)
WBC: 10.2 10*3/uL (ref 4.0–10.5)
nRBC: 0 % (ref 0.0–0.2)

## 2020-08-17 LAB — RESPIRATORY PANEL BY RT PCR (FLU A&B, COVID)
Influenza A by PCR: NEGATIVE
Influenza B by PCR: NEGATIVE
SARS Coronavirus 2 by RT PCR: NEGATIVE

## 2020-08-17 LAB — COMPREHENSIVE METABOLIC PANEL
ALT: 14 U/L (ref 0–44)
AST: 18 U/L (ref 15–41)
Albumin: 3.6 g/dL (ref 3.5–5.0)
Alkaline Phosphatase: 53 U/L (ref 38–126)
Anion gap: 10 (ref 5–15)
BUN: 12 mg/dL (ref 8–23)
CO2: 24 mmol/L (ref 22–32)
Calcium: 9.2 mg/dL (ref 8.9–10.3)
Chloride: 106 mmol/L (ref 98–111)
Creatinine, Ser: 1.14 mg/dL — ABNORMAL HIGH (ref 0.44–1.00)
GFR, Estimated: 44 mL/min — ABNORMAL LOW (ref >60)
Glucose, Bld: 120 mg/dL — ABNORMAL HIGH (ref 70–99)
Potassium: 3.8 mmol/L (ref 3.5–5.1)
Sodium: 140 mmol/L (ref 135–145)
Total Bilirubin: 0.9 mg/dL (ref 0.3–1.2)
Total Protein: 6.2 g/dL — ABNORMAL LOW (ref 6.5–8.1)

## 2020-08-17 MED ORDER — AZITHROMYCIN 250 MG PO TABS
500.0000 mg | ORAL_TABLET | Freq: Once | ORAL | Status: AC
  Administered 2020-08-17: 500 mg via ORAL
  Filled 2020-08-17: qty 2

## 2020-08-17 MED ORDER — AMOXICILLIN-POT CLAVULANATE 875-125 MG PO TABS: 1.0000 | ORAL_TABLET | Freq: Two times a day (BID) | ORAL | 0 refills | Status: DC

## 2020-08-17 MED ORDER — ALBUTEROL SULFATE HFA 108 (90 BASE) MCG/ACT IN AERS
2.0000 | INHALATION_SPRAY | Freq: Once | RESPIRATORY_TRACT | Status: AC
  Administered 2020-08-17: 2 via RESPIRATORY_TRACT
  Filled 2020-08-17: qty 6.7

## 2020-08-17 MED ORDER — ALBUTEROL SULFATE HFA 108 (90 BASE) MCG/ACT IN AERS
4.0000 | INHALATION_SPRAY | Freq: Once | RESPIRATORY_TRACT | Status: AC
  Administered 2020-08-17: 4 via RESPIRATORY_TRACT
  Filled 2020-08-17: qty 6.7

## 2020-08-17 MED ORDER — AMOXICILLIN-POT CLAVULANATE 875-125 MG PO TABS
1.0000 | ORAL_TABLET | Freq: Once | ORAL | Status: AC
  Administered 2020-08-17: 1 via ORAL
  Filled 2020-08-17: qty 1

## 2020-08-17 MED ORDER — AZITHROMYCIN 250 MG PO TABS: 250.0000 mg | ORAL_TABLET | Freq: Every day | ORAL | 0 refills | Status: DC

## 2020-08-17 NOTE — ED Notes (Signed)
Pt aaox4, gcs15, reporting one night of sob and wheezing, pt states s/s started after taking amiodarone, pt has been using amiodarone for 1 week and developed "allergy like" s/s last night, after arriving to room pt stated s/s have resolved. No noticeable oral swelling noted, pt able to speak in full sentences without difficulty, lung sounds diminished and wheezing noted. Pt denies chest pain denies abd pain. Placed on ccm, other vss, side rails up, call bell in reach.

## 2020-08-17 NOTE — ED Provider Notes (Signed)
[facility] EMERGENCY DEPARTMENT Provider Note   CSN: [id number] Arrival date & time: 08/17/20  3500     History Chief Complaint  Patient presents with  . SOB/Wheezing    Carla Flowers is a 86 y.o. female.  Carla Flowers is a 86 y.o. female with a history of A. fib, hyperlipidemia, SVT, mitral valve prolapse, seizure, who presents to the emergency department For evaluation of wheezing and shortness of breath.  She reports symptoms began around 11:00 last night about 2 hours after taking her evening medication.  Patient states that overall now her symptoms have improved significantly and she feels that her breathing is better after receiving albuterol in triage.  She was recently started on amiodarone for her A. fib and has been taking this now for about a week.  She denies any similar symptoms during the week, and reports that this is her only new medication.  They started her on this to help with her A. fib with plans for cardioversion next week.  Patient is very concerned that the symptoms she experienced last night were due to an allergic reaction.  She denies any associated rash, no facial swelling.  No lightheadedness or syncope.  She has had an occasional nonproductive cough but denies any fevers.  No chest pain.  No lower extremity swelling.  Patient denies any palpitations.  Reports she has had her Covid vaccine and denies any sick contacts.  No other aggravating or alleviating factors.        Past Medical History:  Diagnosis Date  . A-fib ([place])   . Asymmetric septal hypertrophy (HCC)   . Basal cell carcinoma    nose  . GERD (gastroesophageal reflux disease)   . H/O: rheumatic fever   . Hyperlipidemia   . Mitral valve prolapse   . Seizure ([place])   . SVT (supraventricular tachycardia) (HCC)    documented short RP tachycardia, previously adenosine sensitive    Patient Active Problem List   Diagnosis Date Noted  . Atypical atrial flutter ([place])  07/29/2020  . Secondary hypercoagulable state ([place]) 07/29/2020  . Persistent atrial fibrillation with rapid ventricular response ([place]) 05/26/2020  . Postural dizziness with presyncope 05/26/2020  . PAF (paroxysmal atrial fibrillation) ([place])   . Elevated troponin level 07/04/2014  . Shortness of breath 10/02/2013  . Chest pain 10/02/2013  . Hyperlipemia 05/07/2013  . Tachycardia-bradycardia ([place]) 05/07/2013  . MVP (mitral valve prolapse) 12/10/2011  . Vitamin D deficiency 12/10/2011  . Benign essential HTN 11/17/2010  . PSVT (paroxysmal supraventricular tachycardia) ([place]) 11/17/2010  . 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 PALPITATIONS 10/01/2009    Past Surgical History:  Procedure Laterality Date  . ABDOMINAL HYSTERECTOMY    . BASAL CELL CARCINOMA EXCISION     nose     OB History   No obstetric history on file.     Family History  Problem Relation Age of Onset  . Heart attack Mother   . Stroke Mother   . Prostate cancer Father   . Atrial fibrillation Sister     Social History   Tobacco Use  . Smoking status: Never Smoker  . Smokeless tobacco: Never Used  Vaping Use  . Vaping Use: Never used  Substance Use Topics  . Alcohol use: No  . Drug use: No    Home Medications Prior to Admission medications   Medication Sig Start Date End Date Taking? Authorizing Provider  acetaminophen (TYLENOL) 500 MG tablet Take 500-1,000 mg  by mouth every 6 (six) hours as needed (pain.).    [provider]  amiodarone (PACERONE) 200 MG tablet Take 1 tablet (200 mg total) by mouth daily. 08/12/20   Allred, Jeneen Rinks, MD  Coenzyme Q10 (COQ-10) 100 MG CAPS Take 1 capsule by mouth daily.    [provider]  diltiazem (CARDIZEM CD) 180 MG 24 hr capsule Take 1 capsule (180 mg total) by mouth daily. 06/03/20   Allred, Jeneen Rinks, MD  diltiazem (CARDIZEM) 60 MG tablet TAKE 1-2 TABLETS DAILY AS NEEDED FOR PALPITATIONS Patient taking differently: Take 60-120 mg by  mouth 2 (two) times daily as needed (palpitations).  04/26/19   Allred, Jeneen Rinks, MD  ELIQUIS 5 MG TABS tablet TAKE 1 TABLET(5 MG) BY MOUTH TWICE DAILY 04/12/20   Allred, Jeneen Rinks, MD  famotidine (PEPCID) 20 MG tablet TAKE 1 TABLET(20 MG) BY MOUTH TWICE DAILY Patient taking differently: Take 20 mg by mouth 2 (two) times daily.  04/12/20   Thornton Park, MD  flecainide (TAMBOCOR) 50 MG tablet Take 0.5 tablets (25 mg total) by mouth 2 (two) times daily. Patient not taking: Reported on 08/13/2020 08/01/20   Fenton, Clint R, PA  levETIRAcetam (KEPPRA) 500 MG tablet Take 500 mg by mouth Twice daily. 12/10/11   [provider]  pantoprazole (PROTONIX) 40 MG tablet Take 40 mg by mouth at bedtime.  07/31/19   [provider]  Polyethyl Glycol-Propyl Glycol (LUBRICANT EYE DROPS) 0.4-0.3 % SOLN Place 1 drop into both eyes 3 (three) times daily as needed (dry/irritated eyes.).    [provider]  rosuvastatin (CRESTOR) 20 MG tablet Take 20 mg by mouth every other day. At night. 10/06/19   [provider]  vitamin B-12 (CYANOCOBALAMIN) 1000 MCG tablet Take 1,000 mcg by mouth daily.    [provider]    Allergies    Sulfa antibiotics, Amoxicillin, Codeine, Cortisone, Macrobid [nitrofurantoin], and Sulfonamide derivatives  Review of Systems   Review of Systems  Constitutional: Negative for chills and fever.  HENT: Negative for facial swelling, sore throat, trouble swallowing and voice change.   Respiratory: Positive for cough, shortness of breath and wheezing. Negative for chest tightness.   Cardiovascular: Negative for chest pain, palpitations and leg swelling.  Gastrointestinal: Negative for abdominal pain, nausea and vomiting.  Genitourinary: Negative for dysuria.  Musculoskeletal: Negative for arthralgias and myalgias.  Skin: Negative for rash.  Neurological: Negative for dizziness, syncope and light-headedness.  All other systems reviewed and are  negative.   Physical Exam Updated Vital Signs BP 121/67 (BP Location: Left Arm)   Pulse 76   Temp 98.5 F (36.9 C) (Oral)   Resp 18   Ht 5\' 5"  (1.651 m)   Wt 80 kg   SpO2 93%   BMI 29.35 kg/m   Physical Exam Vitals and nursing note reviewed.  Constitutional:      General: She is not in acute distress.    Appearance: Normal appearance. She is well-developed. She is not ill-appearing or diaphoretic.     Comments: Elderly female well-appearing and in no acute distress  HENT:     Head: Normocephalic and atraumatic.     Nose: Nose normal.     Mouth/Throat:     Mouth: Mucous membranes are moist.     Pharynx: Oropharynx is clear.     Comments: No evidence of perioral swelling, posterior oropharynx is clear without edema, patient has normal phonation and is tolerating secretions without difficulty Eyes:     General:  Right eye: No discharge.        Left eye: No discharge.     Pupils: Pupils are equal, round, and reactive to light.  Cardiovascular:     Rate and Rhythm: Normal rate and regular rhythm.     Pulses: Normal pulses.     Heart sounds: Normal heart sounds.     Comments: Regular rate and rhythm noted Pulmonary:     Effort: Pulmonary effort is normal. No respiratory distress.     Breath sounds: Wheezing present. No rales.     Comments: Respirations are equal and unlabored but patient does still have some expiratory wheezing on exam.  She is able to speak in full sentences, good air movement with no rales or rhonchi noted Abdominal:     General: Bowel sounds are normal. There is no distension.     Palpations: Abdomen is soft. There is no mass.     Tenderness: There is no abdominal tenderness. There is no guarding.     Comments: Abdomen soft, nondistended, nontender to palpation in all quadrants without guarding or peritoneal signs  Musculoskeletal:        General: No deformity.     Cervical back: Neck supple.     Right lower leg: No edema.     Left lower leg: No  edema.  Skin:    General: Skin is warm and dry.     Capillary Refill: Capillary refill takes less than 2 seconds.  Neurological:     Mental Status: She is alert.     Coordination: Coordination normal.     Comments: Speech is clear, able to follow commands Moves extremities without ataxia, coordination intact  Psychiatric:        Mood and Affect: Mood normal.        Behavior: Behavior normal.     ED Results / Procedures / Treatments   Labs (all labs ordered are listed, but only abnormal results are displayed) Labs Reviewed  COMPREHENSIVE METABOLIC PANEL - Abnormal; Notable for the following components:      Result Value   Glucose, Bld 120 (*)    Creatinine, Ser 1.14 (*)    Total Protein 6.2 (*)    GFR, Estimated 44 (*)    All other components within normal limits  RESPIRATORY PANEL BY RT PCR (FLU A&B, COVID)  CBC WITH DIFFERENTIAL/PLATELET    EKG EKG Interpretation  Date/Time:  Saturday August 17 2020 03:55:15 EDT Ventricular Rate:  77 PR Interval:  248 QRS Duration: 126 QT Interval:  380 QTC Calculation: 430 R Axis:   -19 Text Interpretation: Sinus rhythm with 1st degree A-V block Left ventricular hypertrophy with QRS widening and repolarization abnormality ( R in aVL , Cornell product , Romhilt-Estes ) Abnormal ECG Interpretation limited secondary to artifact Confirmed by Ripley Fraise [phone]) on 08/17/2020 6:41:59 AM   Radiology DG Chest 2 View  Result Date: 08/17/2020 CLINICAL DATA:  Shortness of breath EXAM: CHEST - 2 VIEW COMPARISON:  05/26/2020 FINDINGS: Mild bilateral lower lung opacities, likely in the lingula and right middle lobe, raising concern for mild pneumonia. No frank interstitial edema. No pleural effusion or pneumothorax. The heart is normal in size. Visualized osseous structures are within normal limits. IMPRESSION: Mild bilateral lower lung opacities, raising concern for mild lingular and right middle lobe pneumonia. Electronically Signed   By:  Julian Hy M.D.   On: 08/17/2020 05:10    Procedures Procedures (including critical care time)  Medications Ordered in ED Medications  albuterol (  VENTOLIN HFA) 108 (90 Base) MCG/ACT inhaler 2 puff (2 puffs Inhalation Given 08/17/20 0356)  albuterol (VENTOLIN HFA) 108 (90 Base) MCG/ACT inhaler 4 puff (4 puffs Inhalation Given 08/17/20 5573)    ED Course  I have reviewed the triage vital signs and the nursing notes.  Pertinent labs & imaging results that were available during my care of the patient were reviewed by me and considered in my medical decision making (see chart for details).    MDM Rules/Calculators/A&P                         86 year old female presents with episode of shortness of breath, wheezing and cough that started last night.  Patient was recently started on amiodarone about a week ago, has been taking this medication with no similar symptoms throughout the week, but was worried this could be an allergic reaction, no associated facial swelling, rash or other signs of anaphylaxis.  Symptoms improved in triage with inhaler, no known history of reactive airway disease, no previous smoking history.  Basic labs, EKG and chest x-ray obtained from triage.  I have independently ordered, reviewed and interpreted all labs and imaging:  EKG today shows that patient has converted back to sinus rhythm with signs of first-degree AV block.  Throughout cardiac monitoring while in the ED patient has remained in sinus rhythm with no further episodes of A. fib.  She was started on amiodarone with plans for cardioversion next week which will likely no longer be necessary.  Although patient reports that she is still very concerned that the symptoms were because of the amiodarone and she does not want to take it any longer.  Chest x-ray with some mild lower lobe opacities concerning for lingular and right middle lobe pneumonia.  Given bilateral pneumonia we will check Covid test.   Despite chest x-ray findings patient is well-appearing with no sirs criteria, and no hypoxia.  Her lab work is reassuring with no leukocytosis or significant electrolyte derangements.  Patient given additional dose of albuterol for some additional wheezing noted on exam and afterwards she had resolution of wheezing.  Has a steroid allergy documented in her chart, is not sure what reaction but I cannot find anywhere where she is previously received steroids and given that her symptoms are significantly improving will avoid adding additional medications at this time.  Covid and influenza testing is negative.  Will treat for community-acquired pneumonia.  Patient ambulated in department and maintained normal O2 sats, without significant increased work of breathing.  Feel she would be candidate for outpatient treatment with Augmentin and azithromycin.  Patient has been given her first dose of antibiotics in the ED and I have stressed the importance of close follow-up with PCP as well as discussing with cardiology since she has returned to normal sinus rhythm today and may not need cardioversion.  She can also discuss amiodarone with them but at this time does not want to take this medication.  Patient discussed with Dr. Christy Gentles, who saw patient as well and agrees with plan.  Final Clinical Impression(s) / ED Diagnoses Final diagnoses:  Community acquired bilateral lower lobe pneumonia    Rx / DC Orders ED Discharge Orders         Ordered    amoxicillin-clavulanate (AUGMENTIN) 875-125 MG tablet  2 times daily        08/17/20 1035    azithromycin (ZITHROMAX) 250 MG tablet  Daily  08/17/20 1035           Jacqlyn Larsen, PA-C 08/19/20 1138    Ripley Fraise, MD 08/19/20 2326

## 2020-08-17 NOTE — ED Notes (Signed)
Pt ambulated with ease. 97% O2 on room air throughout walk. Denies SOB & CP.

## 2020-08-17 NOTE — ED Triage Notes (Signed)
Patient reports SOB with wheezing and facial twitchings onset last night , occasional dry cough , denies fever or chills , patient suspects allergic reaction to one of her medications. Airway intact .

## 2020-08-17 NOTE — Discharge Instructions (Signed)
Your chest x-ray showed signs of a pneumonia this is likely the cause of your shortness of breath and wheezing.   You were given your first doses of antibiotics today, you will need to take 1 more dose of Augmentin tonight with dinner and then can begin taking azithromycin tomorrow and Augmentin twice daily tomorrow.  You can use inhaler as needed for wheezing, cough and shortness of breath.  Your Covid and flu test were negative today.  If you develop fevers, worsening shortness of breath, wheezing, cough or any other new or concerning symptoms please return to the ED for reevaluation.  Please call Monday morning to schedule close follow-up with your primary care provider as well as with your cardiologist.

## 2020-08-17 NOTE — ED Notes (Signed)
Patient verbalizes understanding of discharge instructions. Opportunity for questioning and answers were provided. Armband removed by staff, pt discharged from ED.  

## 2020-08-19 ENCOUNTER — Telehealth: Payer: Self-pay | Admitting: Internal Medicine

## 2020-08-19 NOTE — Telephone Encounter (Signed)
Patient states on 08/16/20 she was in the hospital with pneumonia and she would like to know if she needs to reschedule procedure scheduled for 08/21/20 with Dr. Audie Box. Please advise.

## 2020-08-19 NOTE — Telephone Encounter (Signed)
Carla Flowers: This is a question for Dr. All right.  He has ordered the cardioversion.  I am unfamiliar with this patient's care and I am just performing the procedure that day in the hospital.  I have CCed Dr. [name].  We may want to reach out to his nurse as well.  Carla Flowers, Carla Flowers  [address] [place], [place] [address] [phone]  10:21 AM

## 2020-08-20 ENCOUNTER — Other Ambulatory Visit (HOSPITAL_COMMUNITY): Payer: Medicare Other

## 2020-08-20 NOTE — Progress Notes (Signed)
Spoke with patient for pre call screening. Patient states has pneumonia and that the cardioversion is cancelled per the patient.States has called cardiology office and informed them.

## 2020-08-21 ENCOUNTER — Ambulatory Visit (HOSPITAL_COMMUNITY): Admission: RE | Admit: 2020-08-21 | Payer: Medicare Other | Source: Home / Self Care | Admitting: Cardiovascular Disease

## 2020-08-21 ENCOUNTER — Telehealth: Payer: Self-pay | Admitting: Internal Medicine

## 2020-08-21 ENCOUNTER — Encounter (HOSPITAL_COMMUNITY): Admission: RE | Payer: Medicare Other | Source: Home / Self Care

## 2020-08-21 SURGERY — CARDIOVERSION
Anesthesia: General

## 2020-08-21 NOTE — Telephone Encounter (Signed)
Patient would like to know if she need to continue taking amiodarone (PACERONE) 200 MG tablet. Please advise.

## 2020-08-22 NOTE — Telephone Encounter (Signed)
Please see phone note from 10/18.

## 2020-08-22 NOTE — Telephone Encounter (Signed)
She is to continue with her follow up at the Afib clinic on 08/28/20 to see what rhythm she is in at that time and how she is feeling. Also we discussed Amiodarone and that she should keep taking it.   She verbalized understanding.

## 2020-08-28 ENCOUNTER — Ambulatory Visit (HOSPITAL_COMMUNITY): Payer: Medicare Other | Admitting: Physician Assistant

## 2020-08-28 ENCOUNTER — Other Ambulatory Visit: Payer: Self-pay | Admitting: Gastroenterology

## 2020-08-28 ENCOUNTER — Other Ambulatory Visit: Payer: Self-pay | Admitting: Internal Medicine

## 2020-08-28 NOTE — Progress Notes (Signed)
Primary Care Physician: [name], MD Primary Electrophysiologist: Dr [name] Referring Physician: [facility] triage/Dr Allred   Carla Flowers is a 86 y.o. female with a history of paroxysmal atrial fibrillation, HLD, SVT, rheumatic fever, MVP who presents for follow up in the [facility]. The patient was initially diagnosed with atrial fibrillation 12/09/17 after presenting to the ED with symptoms of heart racing. Patient is on Eliquis for a CHADS2VASC score of 3. She has been followed by Dr [name] for both SVT and afib. She has declined ablation for SVT in the past. She reports that overnight on 07/28/20 she had symptoms of heart racing. She has taken doses of her PRN diltiazem with only mild improvement in her heart rate. She did have several episodes of diarrhea prior to the onset of her palpitations. She was started on flecainide but could not titrate up due to baseline conduction disease and she continued to have atrial flutter on lower doses. She was started on amiodarone 08/12/20.  On follow up today, patient was seen by Dr [name] on 08/12/20 and her flecainide was stopped and she was started on amiodarone. She presented to the ED on 08/17/20 with SOB and was found to have community acquired PNA. She was in SR at that time. She has completed her antibiotics. She has not had any further heart racing. However, she did stop amiodarone 2/2 concern about side effects.   Today, she denies symptoms of palpitaitons, chest pain, shortness of breath, orthopnea, PND, lower extremity edema, dizziness, presyncope, syncope, snoring, daytime somnolence, bleeding, or neurologic sequela. The patient is tolerating medications without difficulties and is otherwise without complaint today.    Atrial Fibrillation Risk Factors:  she does not have symptoms or diagnosis of sleep apnea. she does have a history of rheumatic fever. she does not have a history of alcohol  use.  she has a BMI of Body mass index is 27.29 kg/m.Marland Kitchen Filed Weights   08/29/20 1010  Weight: 74.4 kg    Family History  Problem Relation Age of Onset   Heart attack Mother    Stroke Mother    Prostate cancer Father    Atrial fibrillation Sister      Atrial Fibrillation Management history:  Previous antiarrhythmic drugs: flecainide, amiodarone  Previous cardioversions: none Previous ablations: none CHADS2VASC score: 3 Anticoagulation history: Eliquis   Past Medical History:  Diagnosis Date   A-fib ([facility])    Asymmetric septal hypertrophy ([facility])    Basal cell carcinoma    nose   GERD (gastroesophageal reflux disease)    H/O: rheumatic fever    Hyperlipidemia    Mitral valve prolapse    Seizure (H[facility])    SVT (supraventricular tachycardia) ([place])    documented short RP tachycardia, previously adenosine sensitive   Past Surgical History:  Procedure Laterality Date   ABDOMINAL HYSTERECTOMY     BASAL CELL CARCINOMA EXCISION     nose    Current Outpatient Medications  Medication Sig Dispense Refill   acetaminophen (TYLENOL) 500 MG tablet Take 500-1,000 mg by mouth every 6 (six) hours as needed (pain.).     Coenzyme Q10 (COQ-10) 100 MG CAPS Take 1 capsule by mouth daily.     diltiazem (CARDIZEM CD) 180 MG 24 hr capsule Take 1 capsule (180 mg total) by mouth daily. 30 capsule 11   diltiazem (CARDIZEM) 60 MG tablet TAKE 1-2 TABLETS DAILY AS NEEDED FOR PALPITATIONS (Patient taking differently: Take 60-120 mg by mouth 2 (two)  times daily as needed (palpitations). ) 30 tablet 6   ELIQUIS 5 MG TABS tablet TAKE 1 TABLET(5 MG) BY MOUTH TWICE DAILY 60 tablet 5   famotidine (PEPCID) 20 MG tablet TAKE 1 TABLET(20 MG) BY MOUTH TWICE DAILY 60 tablet 3   levETIRAcetam (KEPPRA) 500 MG tablet Take 500 mg by mouth Twice daily.     pantoprazole (PROTONIX) 40 MG tablet Take 40 mg by mouth at bedtime.      Polyethyl Glycol-Propyl Glycol (LUBRICANT EYE DROPS) 0.4-0.3 %  SOLN Place 1 drop into both eyes 3 (three) times daily as needed (dry/irritated eyes.).     rosuvastatin (CRESTOR) 20 MG tablet Take 20 mg by mouth every other day. At night.     vitamin B-12 (CYANOCOBALAMIN) 1000 MCG tablet Take 1,000 mcg by mouth daily.     amiodarone (PACERONE) 200 MG tablet Take 1 tablet (200 mg total) by mouth daily. (Patient not taking: Reported on 08/29/2020) 90 tablet 3   No current facility-administered medications for this encounter.    Allergies  Allergen Reactions   Sulfa Antibiotics Other (See Comments)    'couldn't breathe good'   Amoxicillin Other (See Comments)    Tremors   Codeine Other (See Comments)    unknown   Cortisone Hives and Other (See Comments)    Childhood reaction Unknown    Macrobid [Nitrofurantoin] Other (See Comments)    'achy all over', chills    Sulfonamide Derivatives Other (See Comments)    unknown    Social History   Socioeconomic History   Marital status: Married    Spouse name: Not on file   Number of children: 4   Years of education: Not on file   Highest education level: Not on file  Occupational History   Occupation: retired  Tobacco Use   Smoking status: Never Smoker   Smokeless tobacco: Never Used  Scientific laboratory technician Use: Never used  Substance and Sexual Activity   Alcohol use: No   Drug use: No   Sexual activity: Not on file  Other Topics Concern   Not on file  Social History Narrative   Not on file   Social Determinants of Health   Financial Resource Strain:    Difficulty of Paying Living Expenses: Not on file  Food Insecurity:    Worried About Charity fundraiser in the Last Year: Not on file   [place] in the Last Year: Not on file  Transportation Needs:    Lack of Transportation (Medical): Not on file   Lack of Transportation (Non-Medical): Not on file  Physical Activity:    Days of Exercise per Week: Not on file   Minutes of Exercise per Session: Not  on file  Stress:    Feeling of Stress : Not on file  Social Connections:    Frequency of Communication with Friends and Family: Not on file   Frequency of Social Gatherings with Friends and Family: Not on file   Attends Religious Services: Not on file   Active Member of Clubs or Organizations: Not on file   Attends Archivist Meetings: Not on file   Marital Status: Not on file  Intimate Partner Violence:    Fear of Current or Ex-Partner: Not on file   Emotionally Abused: Not on file   Physically Abused: Not on file   Sexually Abused: Not on file     ROS- All systems are reviewed and negative except as per the  HPI above.  Physical Exam: Vitals:   08/29/20 1010  BP: (!) 144/64  Pulse: (!) 59  Weight: 74.4 kg  Height: 5\' 5"  (1.651 m)    GEN- The patient is well appearing elderly female, alert and oriented x 3 today.   HEENT-head normocephalic, atraumatic, sclera clear, conjunctiva pink, hearing intact, trachea midline. Lungs- Clear to ausculation bilaterally, normal work of breathing Heart- Regular rate and rhythm, no murmurs, rubs or gallops  GI- soft, NT, ND, + BS Extremities- no clubbing, cyanosis, or edema MS- no significant deformity or atrophy Skin- no rash or lesion Psych- euthymic mood, full affect Neuro- strength and sensation are intact   Wt Readings from Last 3 Encounters:  08/29/20 74.4 kg  08/17/20 80 kg  08/12/20 74.8 kg    EKG today demonstrates SB HR 59, LVH with repol, PR 196, QRS 110, QTc 417  Echo 05/26/20 demonstrated  1. Left ventricular ejection fraction, by estimation, is 60 to 65%. The  left ventricle has normal function. The left ventricle has no regional  wall motion abnormalities. There is moderate left ventricular hypertrophy.  Left ventricular diastolic  parameters are consistent with Grade II diastolic dysfunction  (pseudonormalization). Elevated left ventricular end-diastolic pressure.  2. Right ventricular  systolic function is normal. The right ventricular  size is normal. There is normal pulmonary artery systolic pressure.  3. Left atrial size was moderately dilated.  4. The mitral valve is abnormal. Mild mitral valve regurgitation.  5. The aortic valve is tricuspid. Aortic valve regurgitation is trivial.  Mild aortic valve sclerosis is present, with no evidence of aortic valve  stenosis.  6. The inferior vena cava is normal in size with greater than 50%  respiratory variability, suggesting right atrial pressure of 3 mmHg.   Epic records are reviewed at length today  CHA2DS2-VASc Score = 3  The patient's score is based upon: CHF History: 0 HTN History: 0 Diabetes History: 0 Stroke History: 0 Vascular Disease History: 0      ASSESSMENT AND PLAN: 1. Paroxysmal Atrial Fibrillation/SVT/atypical atrial flutter The patient's CHA2DS2-VASc score is 3, indicating a 3.2% annual risk of stroke. Patient failed flecainide.  She is in SR today. She is adamant about not resuming amiodarone unless "absolutely necessary". I do worry about recurrence of her arrhythmias if she is not on AAD. Patient voices understanding.  Continue diltiazem 180 mg daily with 60 mg PRN for heart racing. Continue Eliquis 5 mg BID  2. Secondary Hypercoagulable State (ICD10:  D68.69) The patient is at significant risk for stroke/thromboembolism based upon her CHA2DS2-VASc Score of 3.  Continue Apixaban (Eliquis).    Follow up with Dr [name] as scheduled.    [facility] [address] [place], [place] [address] [phone] 08/29/2020 11:39 AM

## 2020-08-29 ENCOUNTER — Ambulatory Visit (HOSPITAL_COMMUNITY)
Admission: RE | Admit: 2020-08-29 | Discharge: 2020-08-29 | Disposition: A | Discharge status: Home / Self Care | Payer: Medicare Other | Source: Ambulatory Visit | Attending: Physician Assistant | Admitting: Physician Assistant

## 2020-08-29 ENCOUNTER — Other Ambulatory Visit: Payer: Self-pay

## 2020-08-29 VITALS — BP 144/64 | HR 59 | Ht 65.0 in | Wt 164.0 lb

## 2020-08-29 DIAGNOSIS — Z79899 Other long term (current) drug therapy: Secondary | ICD-10-CM | POA: Insufficient documentation

## 2020-08-29 DIAGNOSIS — I480 Paroxysmal atrial fibrillation: Secondary | ICD-10-CM | POA: Diagnosis not present

## 2020-08-29 DIAGNOSIS — I471 Supraventricular tachycardia: Secondary | ICD-10-CM | POA: Diagnosis not present

## 2020-08-29 DIAGNOSIS — D6869 Other thrombophilia: Secondary | ICD-10-CM

## 2020-08-29 DIAGNOSIS — Z7901 Long term (current) use of anticoagulants: Secondary | ICD-10-CM | POA: Diagnosis not present

## 2020-08-29 DIAGNOSIS — I484 Atypical atrial flutter: Secondary | ICD-10-CM | POA: Diagnosis not present

## 2020-08-29 DIAGNOSIS — E785 Hyperlipidemia, unspecified: Secondary | ICD-10-CM | POA: Insufficient documentation

## 2020-08-29 NOTE — Telephone Encounter (Signed)
This patient was supposed to have follow-up with me earlier this fall. Please offer her an appointment if her symptoms persist. Thank you.

## 2020-09-23 ENCOUNTER — Ambulatory Visit: Payer: Medicare Other | Admitting: Internal Medicine

## 2020-10-09 ENCOUNTER — Other Ambulatory Visit: Payer: Self-pay | Admitting: Internal Medicine

## 2020-10-09 NOTE — Telephone Encounter (Signed)
Prescription refill request for Eliquis received. Indication:Afib Last office visit:Allred, 08/12/2020 Scr: 1.14, 08/17/2020 Age: 86 yo Weight: 74.4 kg   Prescription refill sent.

## 2020-10-15 ENCOUNTER — Ambulatory Visit: Payer: Medicare Other | Admitting: Gastroenterology

## 2020-10-23 IMAGING — CT CT ABD-PELV W/O CM
2 of 4 series · 17 of 46 positions shown, 19 images · non-contrast
Comparison: None.

CLINICAL DATA: Epigastric pain and mid abdominal pain

EXAM:
CT ABDOMEN AND PELVIS WITHOUT CONTRAST
TECHNIQUE: Multidetector CT imaging of the abdomen and pelvis was performed
following the standard protocol without IV contrast.

[Series 2: axial st · axial · 0.80mm/px · z∈[-508,-138]mm · 14 of 84 slices shown, 16 images]
[im 5/84  soft-tissue]
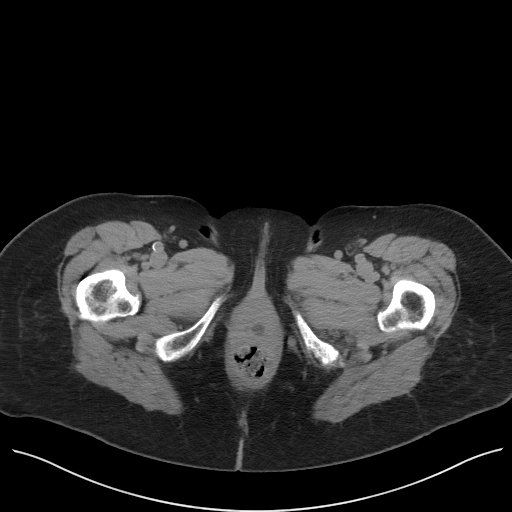
[im 5/84  bone]
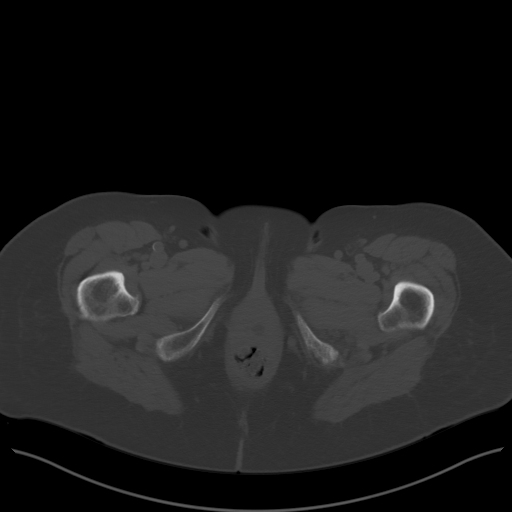
[im 10/84  soft-tissue]
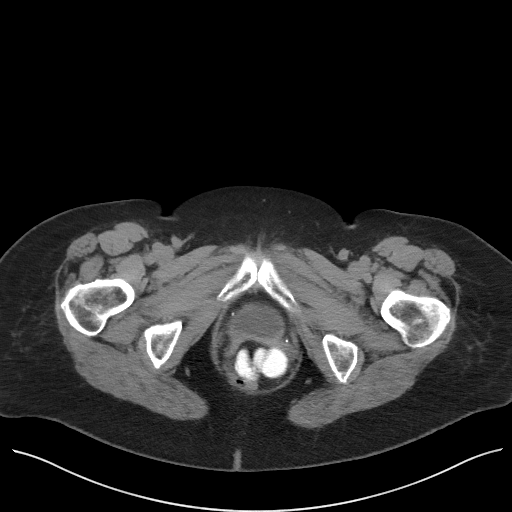
[im 15/84  soft-tissue]
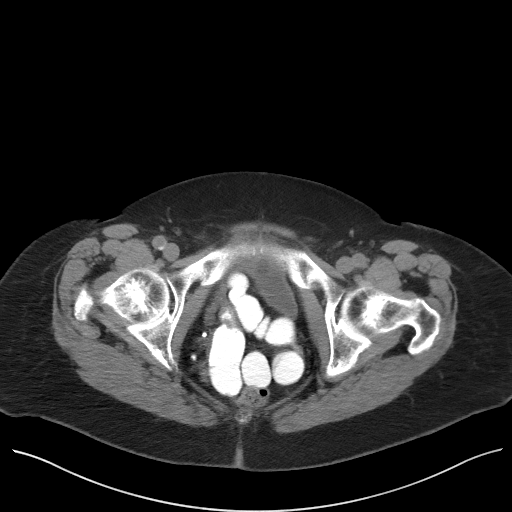
[im 25/84  soft-tissue]
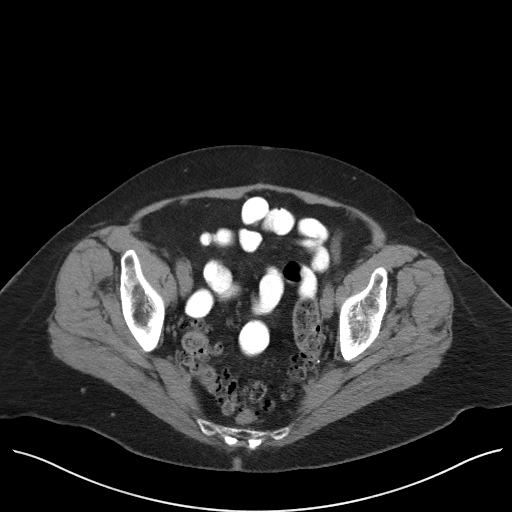
[im 30/84  soft-tissue]
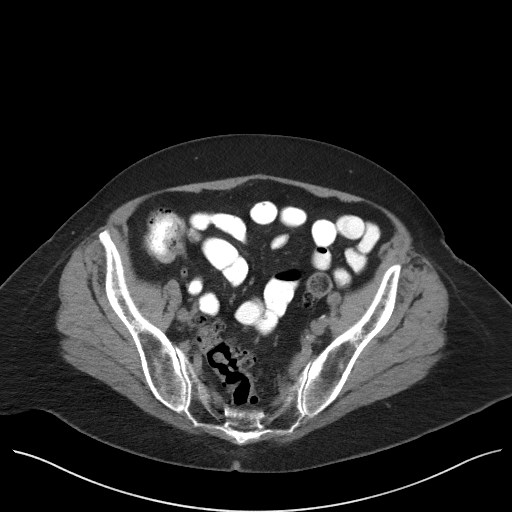
[im 35/84  soft-tissue]
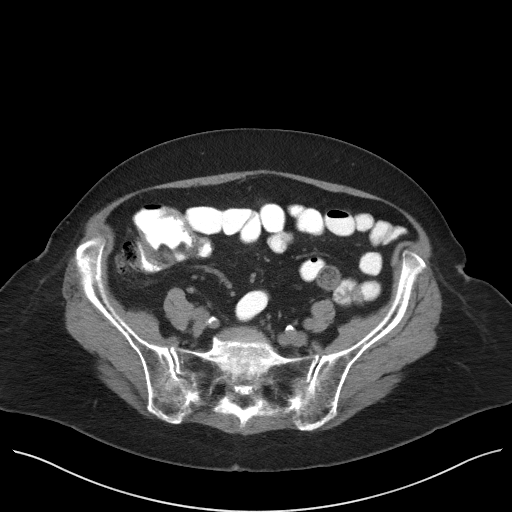
[im 40/84  soft-tissue]
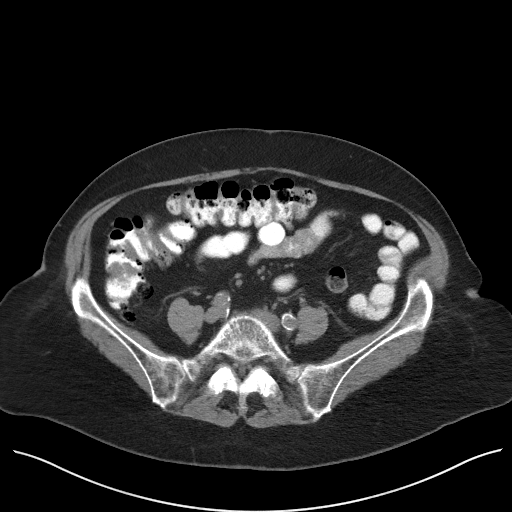
[im 44/84  soft-tissue]
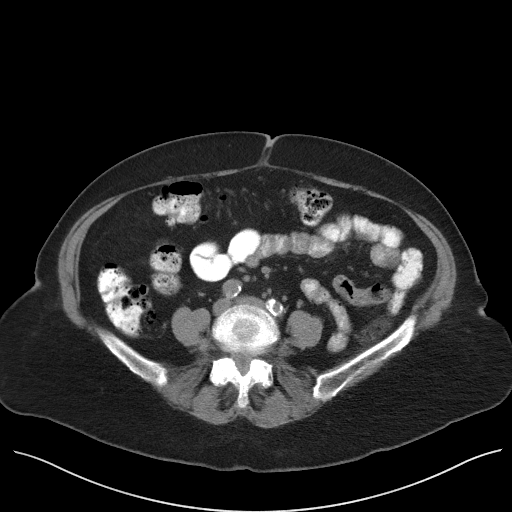
[im 49/84  soft-tissue]
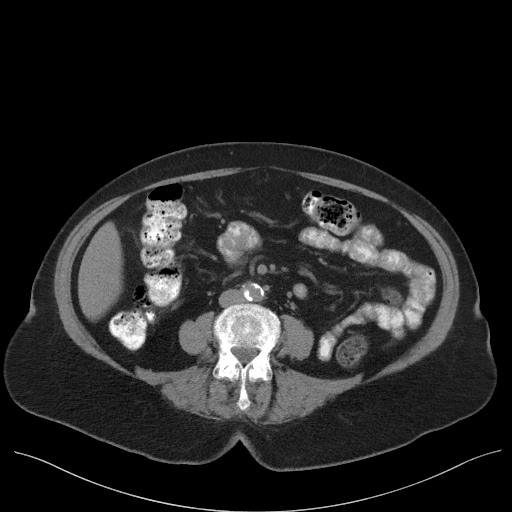
[im 49/84  bone]
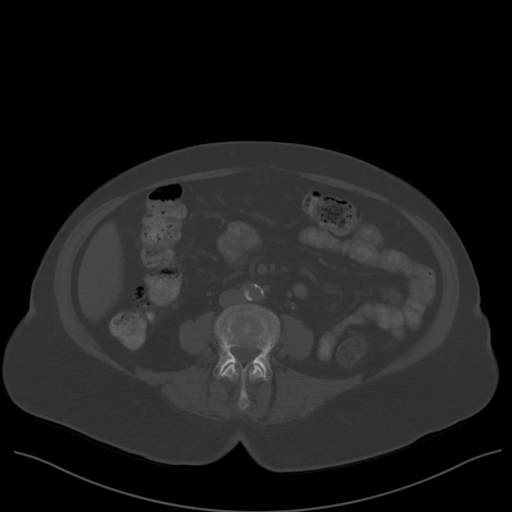
[im 54/84  soft-tissue]
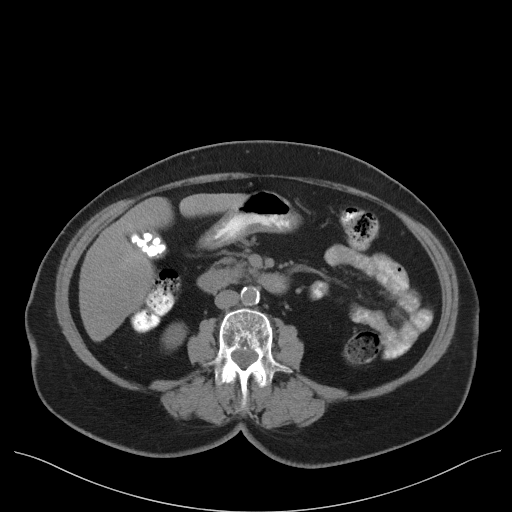
[im 64/84  soft-tissue]
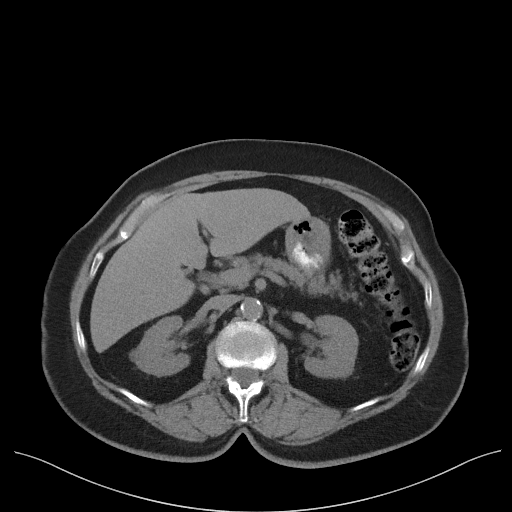
[im 69/84  soft-tissue]
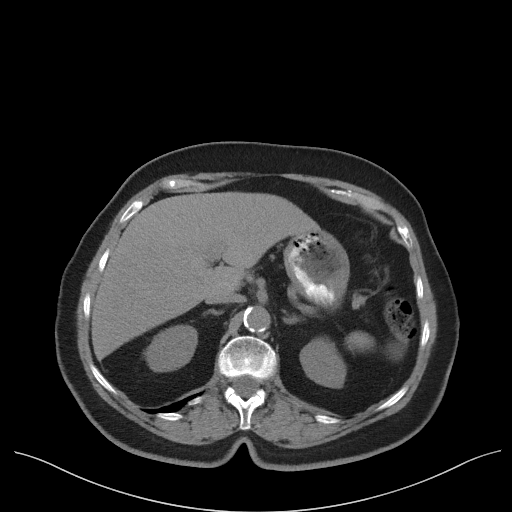
[im 74/84  soft-tissue]
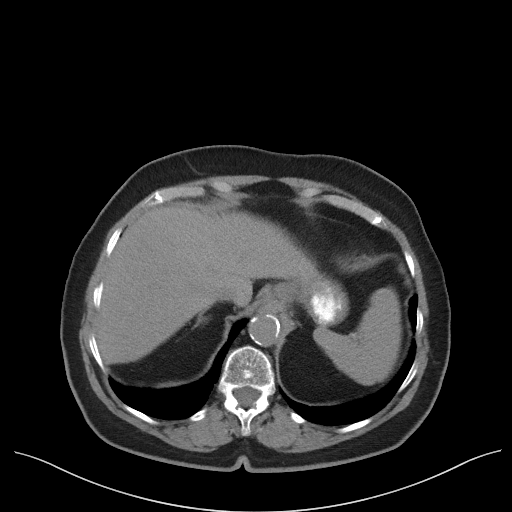
[im 79/84  soft-tissue]
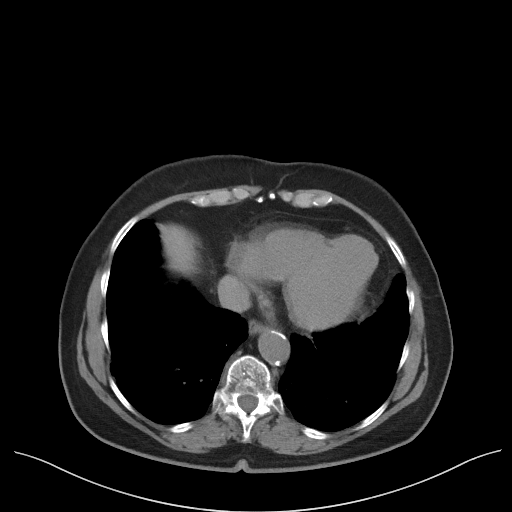

[Series 4: coronal st · coronal · 0.93mm/px · 3 of 77 slices shown]
[im 26/77  soft-tissue]
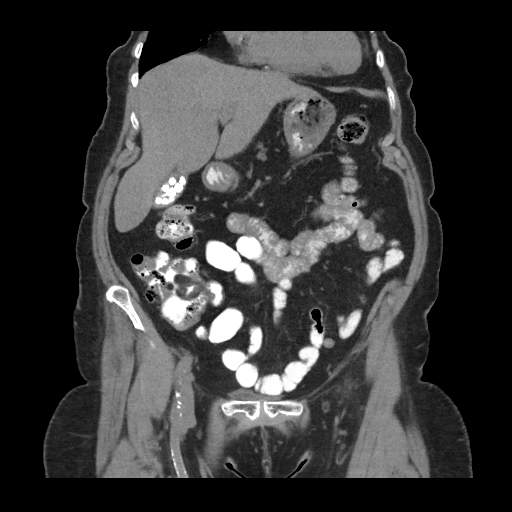
[im 34/77  soft-tissue]
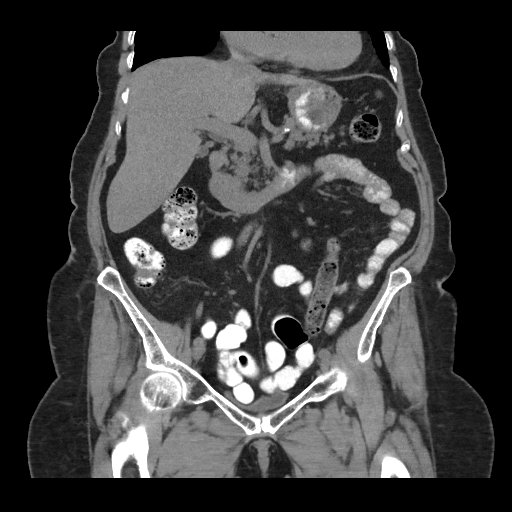
[im 43/77  soft-tissue]
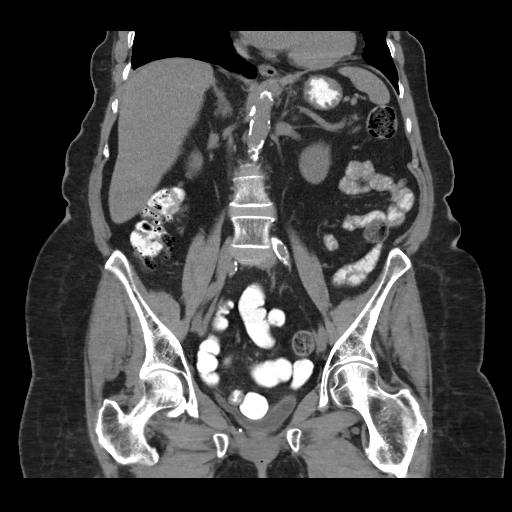

[17 of 46 positions shown; findings below may reference images not displayed]

FINDINGS: Lower chest: No acute abnormality.

Hepatobiliary: No focal liver abnormality is seen. Numerous
subcentimeter gallstones are seen within the lumen of an otherwise
normal-appearing gallbladder.

Pancreas: Unremarkable. No pancreatic ductal dilatation or
surrounding inflammatory changes.

Spleen: Normal in size without focal abnormality.

Adrenals/Urinary Tract: Adrenal glands are unremarkable. Kidneys are
normal in size. Multiple cysts are seen within the right kidney.
Mild bilateral hydronephrosis is seen without evidence of renal
stones. Bladder is unremarkable.

Stomach/Bowel: Stomach is within normal limits. Appendix appears
normal. No evidence of bowel wall thickening, distention, or
inflammatory changes. Numerous noninflamed diverticula are seen
throughout the sigmoid colon.

Vascular/Lymphatic: Marked severity aortic atherosclerosis. No
enlarged abdominal or pelvic lymph nodes.

Reproductive: Status post hysterectomy. No adnexal masses.

Other: No abdominal wall hernia or abnormality. No abdominopelvic
ascites.

Musculoskeletal: No acute or significant osseous findings.
IMPRESSION: 1. Cholelithiasis without evidence of cholecystitis.
2. Marked severity aortic atherosclerosis.
3. Sigmoid diverticulosis without evidence of diverticulitis.

Aortic Atherosclerosis ([id number]-R66.6).

## 2020-11-04 ENCOUNTER — Ambulatory Visit: Payer: Medicare Other | Admitting: Internal Medicine

## 2020-11-21 ENCOUNTER — Ambulatory Visit: Payer: Medicare Other | Admitting: Gastroenterology

## 2020-12-11 ENCOUNTER — Ambulatory Visit (INDEPENDENT_AMBULATORY_CARE_PROVIDER_SITE_OTHER): Payer: Medicare Other | Admitting: Gastroenterology

## 2020-12-11 ENCOUNTER — Encounter: Payer: Self-pay | Admitting: Gastroenterology

## 2020-12-11 VITALS — BP 138/66 | HR 75 | Ht 65.5 in | Wt 166.0 lb

## 2020-12-11 DIAGNOSIS — R1013 Epigastric pain: Secondary | ICD-10-CM

## 2020-12-11 DIAGNOSIS — K219 Gastro-esophageal reflux disease without esophagitis: Secondary | ICD-10-CM

## 2020-12-11 DIAGNOSIS — G8929 Other chronic pain: Secondary | ICD-10-CM

## 2020-12-11 MED ORDER — PANTOPRAZOLE SODIUM 40 MG PO TBEC: 40.0000 mg | DELAYED_RELEASE_TABLET | Freq: Two times a day (BID) | ORAL | 2 refills | Status: DC

## 2020-12-11 MED ORDER — FAMOTIDINE 20 MG PO TABS: 20.0000 mg | ORAL_TABLET | Freq: Every day | ORAL | 3 refills | Status: DC

## 2020-12-11 NOTE — Progress Notes (Signed)
Referring Provider: [name], MD Primary Care Physician:  [name], MD  Chief complaint: Epigastric pain   IMPRESSION:  Reflux with abdominal pain and nausea    - improved on daily PPI but with daily breakthrough symptoms Possible esophageal dysmotility syndrome based on UGI series 10/20 Cholelithiasis  Sigmoid diverticulosis Atrial fibrillation on Eliquis No known family of GI disease Distant colonoscopy for colon cancer screening (>10 years ago) Patient refuses all endoscopy due to advanced age  PLAN: - Reviewed GERD lifestyle modifications - Increase pantoprazole to 40 mg BID - Continue Famotidine 20 mg BID - Low theshhold to pursue esophageal manometry with new symptoms - Follow-up at least annually, earlier if needed  I spent 34 minutes, including in depth chart review, independent review of results, communicating results with the patient directly, face-to-face time with the patient, coordinating care, and ordering studies and medications as appropriate, and documentation.  HPI: Carla Flowers is a 87 y.o. female initially referred for dysphagia in the setting of longstanding reflux. She was last seen 12/11/19. The interval history is obtained through the patient and review of CareEverywhere.  She has hyperlipidemia, hypertension, paroxysmal supraventricular tachycardia, stage III chronic kidney disease, and vitamin B12 deficiency.  She is on chronic Eliquis for paroxysmal atrial fibrillation.  Previously seen for reflux with frequent nocturnal symptoms and a dull, nonradiating epigastric abdominal pain with associated nausea and eructation not completely relieved by Tums, PeptoBismal, Rolaids over the years.  Evaluation has included: - UGI series 08/03/2019: mild esophageal dysmotility with pooling of contrast in the distal third of the esophagus that cleared with a combination of dry swallows and gravity.   - EGD recommended but patient declined  - CT  11/20/2019: cholelithiasis without evidence for cholecystitis, marked severity of aortic atherosclerosis, sigmoid diverticulosis without evidence of diverticulitis. - Normal CBC, CMP 08/17/20   She returns today in annual follow-up requesting a refill in medication. Having some breakthrough reflux in the hour or two prior to her daily pantoprazole dose. No dysphagia, odynophagia, dysphonia, sore throat, neck pain, or altered bowel habits. Appetite is good. Weight has increased.     Past Medical History:  Diagnosis Date  . A-fib ([place])   . Asymmetric septal hypertrophy ([facility])   . Basal cell carcinoma    nose  . GERD (gastroesophageal reflux disease)   . H/O: rheumatic fever   . Hyperlipidemia   . Mitral valve prolapse   . Seizure ([place])   . SVT (supraventricular tachycardia) (H[facility])    documented short RP tachycardia, previously adenosine sensitive    Past Surgical History:  Procedure Laterality Date  . ABDOMINAL HYSTERECTOMY    . BASAL CELL CARCINOMA EXCISION     nose    Current Outpatient Medications  Medication Sig Dispense Refill  . acetaminophen (TYLENOL) 500 MG tablet Take 500-1,000 mg by mouth every 6 (six) hours as needed (pain.).    Marland Kitchen Coenzyme Q10 (COQ-10) 100 MG CAPS Take 1 capsule by mouth daily.    Marland Kitchen diltiazem (CARDIZEM CD) 180 MG 24 hr capsule Take 1 capsule (180 mg total) by mouth daily. 30 capsule 11  . diltiazem (CARDIZEM) 60 MG tablet TAKE 1-2 TABLETS BY MOUTH DAILY AS NEEDED FOR PALPITATIONS 30 tablet 6  . ELIQUIS 5 MG TABS tablet TAKE 1 TABLET(5 MG) BY MOUTH TWICE DAILY 60 tablet 5  . famotidine (PEPCID) 20 MG tablet TAKE 1 TABLET(20 MG) BY MOUTH TWICE DAILY 60 tablet 3  . levETIRAcetam (KEPPRA) 500 MG tablet Take  500 mg by mouth Twice daily.    . pantoprazole (PROTONIX) 40 MG tablet Take 40 mg by mouth at bedtime.     Vladimir Faster Glycol-Propyl Glycol (LUBRICANT EYE DROPS) 0.4-0.3 % SOLN Place 1 drop into both eyes 3 (three) times daily as needed (dry/irritated  eyes.).    Marland Kitchen rosuvastatin (CRESTOR) 20 MG tablet Take 20 mg by mouth every other day. At night.    . vitamin B-12 (CYANOCOBALAMIN) 1000 MCG tablet Take 1,000 mcg by mouth daily.     No current facility-administered medications for this visit.    Allergies as of 12/11/2020 - Review Complete 12/11/2020  Allergen Reaction Noted  . Sulfa antibiotics Other (See Comments) 07/30/2014  . Amoxicillin Other (See Comments) 10/27/2016  . Codeine Other (See Comments)   . Cortisone Hives and Other (See Comments)   . Macrobid [nitrofurantoin] Other (See Comments)   . Sulfonamide derivatives Other (See Comments)     Family History  Problem Relation Age of Onset  . Heart attack Mother   . Stroke Mother   . Prostate cancer Father   . Atrial fibrillation Sister      Physical Exam: Vital signs were reviewed. General:   Alert, well-nourished, pleasant and cooperative in NAD.  No pallor. Head:  Normocephalic and atraumatic.  No alopecia. Eyes:  Sclera clear, no icterus.   Conjunctiva pink. Mouth:  No deformity or lesions.  No atrophic glossitis. No chelitis. Neck:  Supple; no thyromegaly. Lungs:  Clear throughout to auscultation.   No wheezes.  Heart:  Regular rate and rhythm; no murmurs Abdomen:  Soft, nontender, normal bowel sounds. No rebound or guarding. No hepatosplenomegaly Rectal:  Deferred  Msk:  Symmetrical without gross deformities. Extremities:  No gross deformities or edema. Neurologic:  Alert and  oriented x4;  grossly nonfocal Skin:  No rash or bruise. Psych:  Alert and cooperative. Normal mood and affect.     Brigitte Soderberg L. Tarri Glenn, MD, MPH 12/11/2020, 3:10 PM

## 2020-12-11 NOTE — Patient Instructions (Addendum)
RECOMMENDATION(S):   I recommend increasing your pantoprazole to 40 mg twice daily.  PRESCRIPTION MEDICATION(S):   We have sent the following medication(s) to your pharmacy:  . Pantoprazole - please take 40mg  by mouth twice daily . Famotidine - please take 20mg  by mouth daily  NOTE: If your medication(s) requires a PRIOR AUTHORIZATION, we will receive notification from your pharmacy. Once received, the process to submit for approval may take up to 7-10 business days. You will be contacted about any denials we have received from your insurance company as well as alternatives recommended by your provider.  FOLLOW UP: I would like to see you in the office at least annually, earlier if needed.     BMI:  If you are age 49 or older, your body mass index should be between 23-30. Your There is no height or weight on file to calculate BMI. If this is out of the aforementioned range listed, please consider follow up with your Primary Care Provider.  Thank you for trusting me with your gastrointestinal care!    Thornton Park, MD, MPH

## 2020-12-18 ENCOUNTER — Other Ambulatory Visit: Payer: Self-pay

## 2020-12-18 ENCOUNTER — Telehealth: Payer: Self-pay | Admitting: Gastroenterology

## 2020-12-18 DIAGNOSIS — K219 Gastro-esophageal reflux disease without esophagitis: Secondary | ICD-10-CM

## 2020-12-18 DIAGNOSIS — G8929 Other chronic pain: Secondary | ICD-10-CM

## 2020-12-18 MED ORDER — FAMOTIDINE 20 MG PO TABS: 20.0000 mg | ORAL_TABLET | Freq: Two times a day (BID) | ORAL | 2 refills | Status: DC

## 2020-12-18 NOTE — Telephone Encounter (Signed)
Returned pt call and advised of below:  Outpatient Medication Detail   Disp Refills Start End   famotidine (PEPCID) 20 MG tablet 60 tablet 2 12/18/2020    Sig - Route: Take 1 tablet (20 mg total) by mouth 2 (two) times daily. - Oral   Sent to pharmacy as: famotidine (PEPCID) 20 MG tablet   E-Prescribing Status: Receipt confirmed by pharmacy (12/18/2020  4:53 PM EST)    Outpatient Medication Detail   Disp Refills Start End   pantoprazole (PROTONIX) 40 MG tablet 60 tablet 2 12/11/2020    Sig - Route: Take 1 tablet (40 mg total) by mouth 2 (two) times daily. - Oral   Sent to pharmacy as: pantoprazole (PROTONIX) 40 MG tablet   E-Prescribing Status: Receipt confirmed by pharmacy (12/11/2020  3:24 PM EST)    Verbalized acceptance and understanding.

## 2020-12-18 NOTE — Telephone Encounter (Signed)
Pt is requesting a call back from a nurse to discuss how she is supposed to be taking her PEPCID and PROTONIX

## 2020-12-27 ENCOUNTER — Other Ambulatory Visit: Payer: Self-pay | Admitting: Gastroenterology

## 2020-12-27 DIAGNOSIS — G8929 Other chronic pain: Secondary | ICD-10-CM

## 2020-12-27 DIAGNOSIS — K219 Gastro-esophageal reflux disease without esophagitis: Secondary | ICD-10-CM

## 2021-04-02 ENCOUNTER — Other Ambulatory Visit: Payer: Self-pay

## 2021-04-02 ENCOUNTER — Telehealth: Payer: Self-pay | Admitting: Gastroenterology

## 2021-04-02 DIAGNOSIS — K219 Gastro-esophageal reflux disease without esophagitis: Secondary | ICD-10-CM

## 2021-04-02 DIAGNOSIS — R1013 Epigastric pain: Secondary | ICD-10-CM

## 2021-04-02 DIAGNOSIS — G8929 Other chronic pain: Secondary | ICD-10-CM

## 2021-04-02 MED ORDER — FAMOTIDINE 20 MG PO TABS: 20.0000 mg | ORAL_TABLET | Freq: Two times a day (BID) | ORAL | 11 refills | Status: DC

## 2021-04-02 MED ORDER — PANTOPRAZOLE SODIUM 40 MG PO TBEC: 40.0000 mg | DELAYED_RELEASE_TABLET | Freq: Two times a day (BID) | ORAL | 11 refills | Status: DC

## 2021-04-02 NOTE — Telephone Encounter (Signed)
Outpatient Medication Detail   Disp Refills Start End   famotidine (PEPCID) 20 MG tablet 60 tablet 11 04/02/2021    Sig - Route: Take 1 tablet (20 mg total) by mouth 2 (two) times daily. - Oral   Sent to pharmacy as: famotidine (PEPCID) 20 MG tablet   E-Prescribing Status: Receipt confirmed by pharmacy (04/02/2021 12:00 PM EDT)    Outpatient Medication Detail   Disp Refills Start End   pantoprazole (PROTONIX) 40 MG tablet 60 tablet 11 04/02/2021    Sig - Route: Take 1 tablet (40 mg total) by mouth 2 (two) times daily. - Oral   Sent to pharmacy as: pantoprazole (PROTONIX) 40 MG tablet   E-Prescribing Status: Receipt confirmed by pharmacy (04/02/2021 12:00 PM EDT)    Pharmacy  [facility] [place], [place]  [address] [place], [place] [address]  Phone:  [phone] Fax:  [phone]

## 2021-04-09 ENCOUNTER — Other Ambulatory Visit: Payer: Self-pay | Admitting: Internal Medicine

## 2021-04-09 NOTE — Telephone Encounter (Signed)
Pt last saw Clint Fenton, PA 08/29/20, last labs 08/17/20 Creat 1.14, age 87, weight 75.3kg, based on specified criteeria pt is on appropriate dosage of Eliquis 5mg  BID.  Will refill rx.

## 2021-05-19 ENCOUNTER — Other Ambulatory Visit: Payer: Self-pay | Admitting: Internal Medicine

## 2021-05-20 ENCOUNTER — Other Ambulatory Visit: Payer: Self-pay | Admitting: Internal Medicine

## 2021-05-21 ENCOUNTER — Telehealth: Payer: Self-pay | Admitting: Internal Medicine

## 2021-05-21 MED ORDER — DILTIAZEM HCL ER COATED BEADS 180 MG PO CP24
180.0000 mg | ORAL_CAPSULE | Freq: Every day | ORAL | 0 refills | Status: DC
Start: 2021-05-21 — End: 2021-06-10

## 2021-05-21 NOTE — Telephone Encounter (Signed)
New message    *STAT* If patient is at the pharmacy, call can be transferred to refill team.   1. Which medications need to be refilled? (please list name of each medication and dose if known) Diltiazem 180 mg  2. Which pharmacy/location (including street and city if local pharmacy) is medication to be sent to? [facility] in [place]   3. Do they need a 30 day or 90 day supply? 30 day  Pt is scheduled for an appt with Dr. [name] 06/23/21

## 2021-05-21 NOTE — Telephone Encounter (Signed)
Rx(s) sent to pharmacy electronically.  Patient notified and voiced understanding.  

## 2021-05-23 ENCOUNTER — Other Ambulatory Visit: Payer: Self-pay | Admitting: Internal Medicine

## 2021-06-10 ENCOUNTER — Telehealth: Payer: Self-pay | Admitting: Internal Medicine

## 2021-06-10 MED ORDER — DILTIAZEM HCL ER COATED BEADS 180 MG PO CP24: 180.0000 mg | ORAL_CAPSULE | Freq: Every day | ORAL | 0 refills | Status: DC

## 2021-06-10 NOTE — Telephone Encounter (Signed)
Refill for Diltiazem 160 mg has been sent to [facility].

## 2021-06-10 NOTE — Telephone Encounter (Signed)
New message    *STAT* If patient is at the pharmacy, call can be transferred to refill team.   1. Which medications need to be refilled? (please list name of each medication and dose if known) diltiazem 180 mg  2. Which pharmacy/location (including street and city if local pharmacy) is medication to be sent to? [facility] on [address]   3. Do they need a 30 day or 90 day supply? 30 day

## 2021-06-13 ENCOUNTER — Other Ambulatory Visit: Payer: Self-pay | Admitting: Internal Medicine

## 2021-06-23 ENCOUNTER — Ambulatory Visit: Payer: Medicare Other | Admitting: Internal Medicine

## 2021-06-30 ENCOUNTER — Ambulatory Visit: Payer: Medicare Other | Admitting: Internal Medicine

## 2021-06-30 ENCOUNTER — Other Ambulatory Visit: Payer: Self-pay

## 2021-06-30 VITALS — BP 112/58 | HR 66 | Ht 65.5 in | Wt 151.2 lb

## 2021-06-30 DIAGNOSIS — I480 Paroxysmal atrial fibrillation: Secondary | ICD-10-CM | POA: Diagnosis not present

## 2021-06-30 DIAGNOSIS — I471 Supraventricular tachycardia: Secondary | ICD-10-CM

## 2021-06-30 MED ORDER — DILTIAZEM HCL ER COATED BEADS 180 MG PO CP24: 180.0000 mg | ORAL_CAPSULE | Freq: Every day | ORAL | 3 refills | Status: DC

## 2021-06-30 MED ORDER — DILTIAZEM HCL 60 MG PO TABS: ORAL_TABLET | ORAL | 6 refills | Status: DC

## 2021-06-30 NOTE — Progress Notes (Signed)
PCP: [name], MD   Primary EP: Dr Corbin Ade is a 87 y.o. female who presents today for routine electrophysiology followup.  Since last being seen in our clinic, the patient reports doing reasonably well.  She is not interested in AAD therapy currently.  She continues to have occasional afib events, typically lasting only several hours.  She has found that using prn diltiazem can be beneficial. Today, she denies symptoms of palpitations, chest pain, shortness of breath,  lower extremity edema, dizziness, presyncope, or syncope.  The patient is otherwise without complaint today.   Past Medical History:  Diagnosis Date   A-fib ([place])    Asymmetric septal hypertrophy (HCC)    Basal cell carcinoma    nose   GERD (gastroesophageal reflux disease)    H/O: rheumatic fever    Hyperlipidemia    Mitral valve prolapse    Seizure (HCC)    SVT (supraventricular tachycardia) ([place])    documented short RP tachycardia, previously adenosine sensitive   Past Surgical History:  Procedure Laterality Date   ABDOMINAL HYSTERECTOMY     BASAL CELL CARCINOMA EXCISION     nose    ROS- all systems are reviewed and negatives except as per HPI above  Current Outpatient Medications  Medication Sig Dispense Refill   acetaminophen (TYLENOL) 500 MG tablet Take 500-1,000 mg by mouth every 6 (six) hours as needed (pain.).     Coenzyme Q10 (COQ-10) 100 MG CAPS Take 1 capsule by mouth daily.     diltiazem (CARDIZEM CD) 180 MG 24 hr capsule Take 1 capsule (180 mg total) by mouth daily. 30 capsule 0   diltiazem (CARDIZEM) 60 MG tablet TAKE 1-2 TABLETS BY MOUTH DAILY AS NEEDED FOR PALPITATIONS 30 tablet 6   ELIQUIS 5 MG TABS tablet TAKE 1 TABLET(5 MG) BY MOUTH TWICE DAILY 60 tablet 5   famotidine (PEPCID) 20 MG tablet Take 1 tablet (20 mg total) by mouth 2 (two) times daily. 60 tablet 11   levETIRAcetam (KEPPRA) 500 MG tablet Take 500 mg by mouth Twice daily.     pantoprazole (PROTONIX) 40 MG  tablet Take 1 tablet (40 mg total) by mouth 2 (two) times daily. 60 tablet 11   Polyethyl Glycol-Propyl Glycol (LUBRICANT EYE DROPS) 0.4-0.3 % SOLN Place 1 drop into both eyes 3 (three) times daily as needed (dry/irritated eyes.).     rosuvastatin (CRESTOR) 20 MG tablet Take 20 mg by mouth every other day. At night.     vitamin B-12 (CYANOCOBALAMIN) 1000 MCG tablet Take 1,000 mcg by mouth daily.     No current facility-administered medications for this visit.    Physical Exam: There were no vitals filed for this visit.  GEN- The patient is well appearing, alert and oriented x 3 today.   Head- normocephalic, atraumatic Eyes-  Sclera clear, conjunctiva pink Ears- hearing intact Oropharynx- clear Lungs- Clear to ausculation bilaterally, normal work of breathing Heart- Regular rate and rhythm, no murmurs, rubs or gallops, PMI not laterally displaced GI- soft, NT, ND, + BS Extremities- no clubbing, cyanosis, or edema  Wt Readings from Last 3 Encounters:  12/11/20 166 lb (75.3 kg)  08/29/20 164 lb (74.4 kg)  08/17/20 176 lb 5.9 oz (80 kg)    EKG tracing ordered today is personally reviewed and shows sinus rhythm, LAHB, nonspecific ST /T changes (unchanged from 08/29/20- reviewed)  Assessment and Plan:  Paroxysmal atrial fibrillation/ short RP SVT/ side complex tachycardia Reasonably well controlled currently off  of AAD therapy.  I did offer AADs and she is clear at this time that she would prefer to continue with diltiazem alone. Chads2vasc score is 3.  She is on eliquis Labs 08/17/20 reviewed  Return in 6 months to see EP APP    Thompson Grayer MD, [facility] 06/30/2021 4:29 PM

## 2021-06-30 NOTE — Patient Instructions (Addendum)
Medication Instructions:  Your physician recommends that you continue on your current medications as directed. Please refer to the Current Medication list given to you today.  Labwork: None ordered.  Testing/Procedures: None ordered.  Follow-Up: Your physician wants you to follow-up in: 12/22/21 at 10 :15 am   Tommye Standard, PA-C    Any Other Special Instructions Will Be Listed Below (If Applicable).  If you need a refill on your cardiac medications before your next appointment, please call your pharmacy.   Lidocaine, no Epi for dermatology

## 2021-07-21 IMAGING — CR DG CHEST 2V
2 series · 2 of 2 positions shown · non-contrast
Comparison: 05/26/2020

CLINICAL DATA: Shortness of breath

EXAM:
CHEST - 2 VIEW

[chest pa]
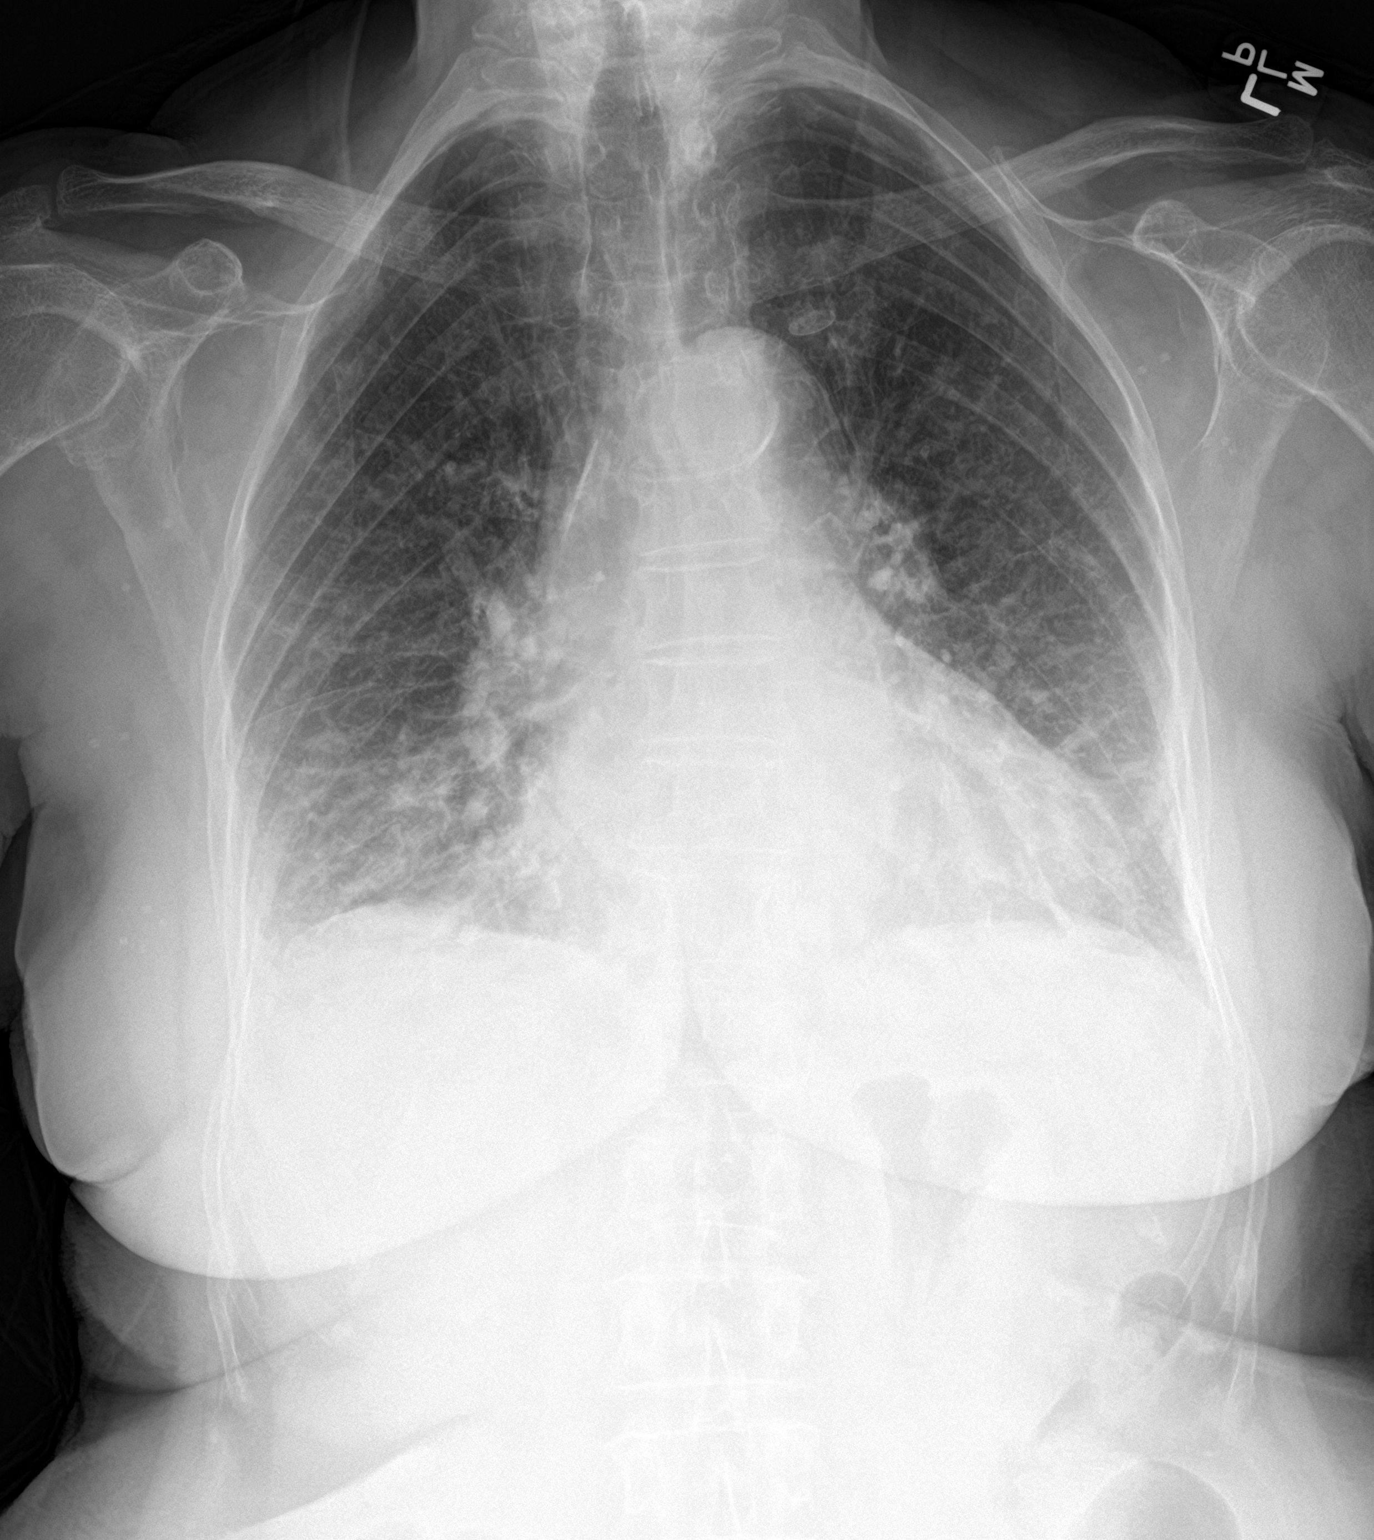

[chest lat]
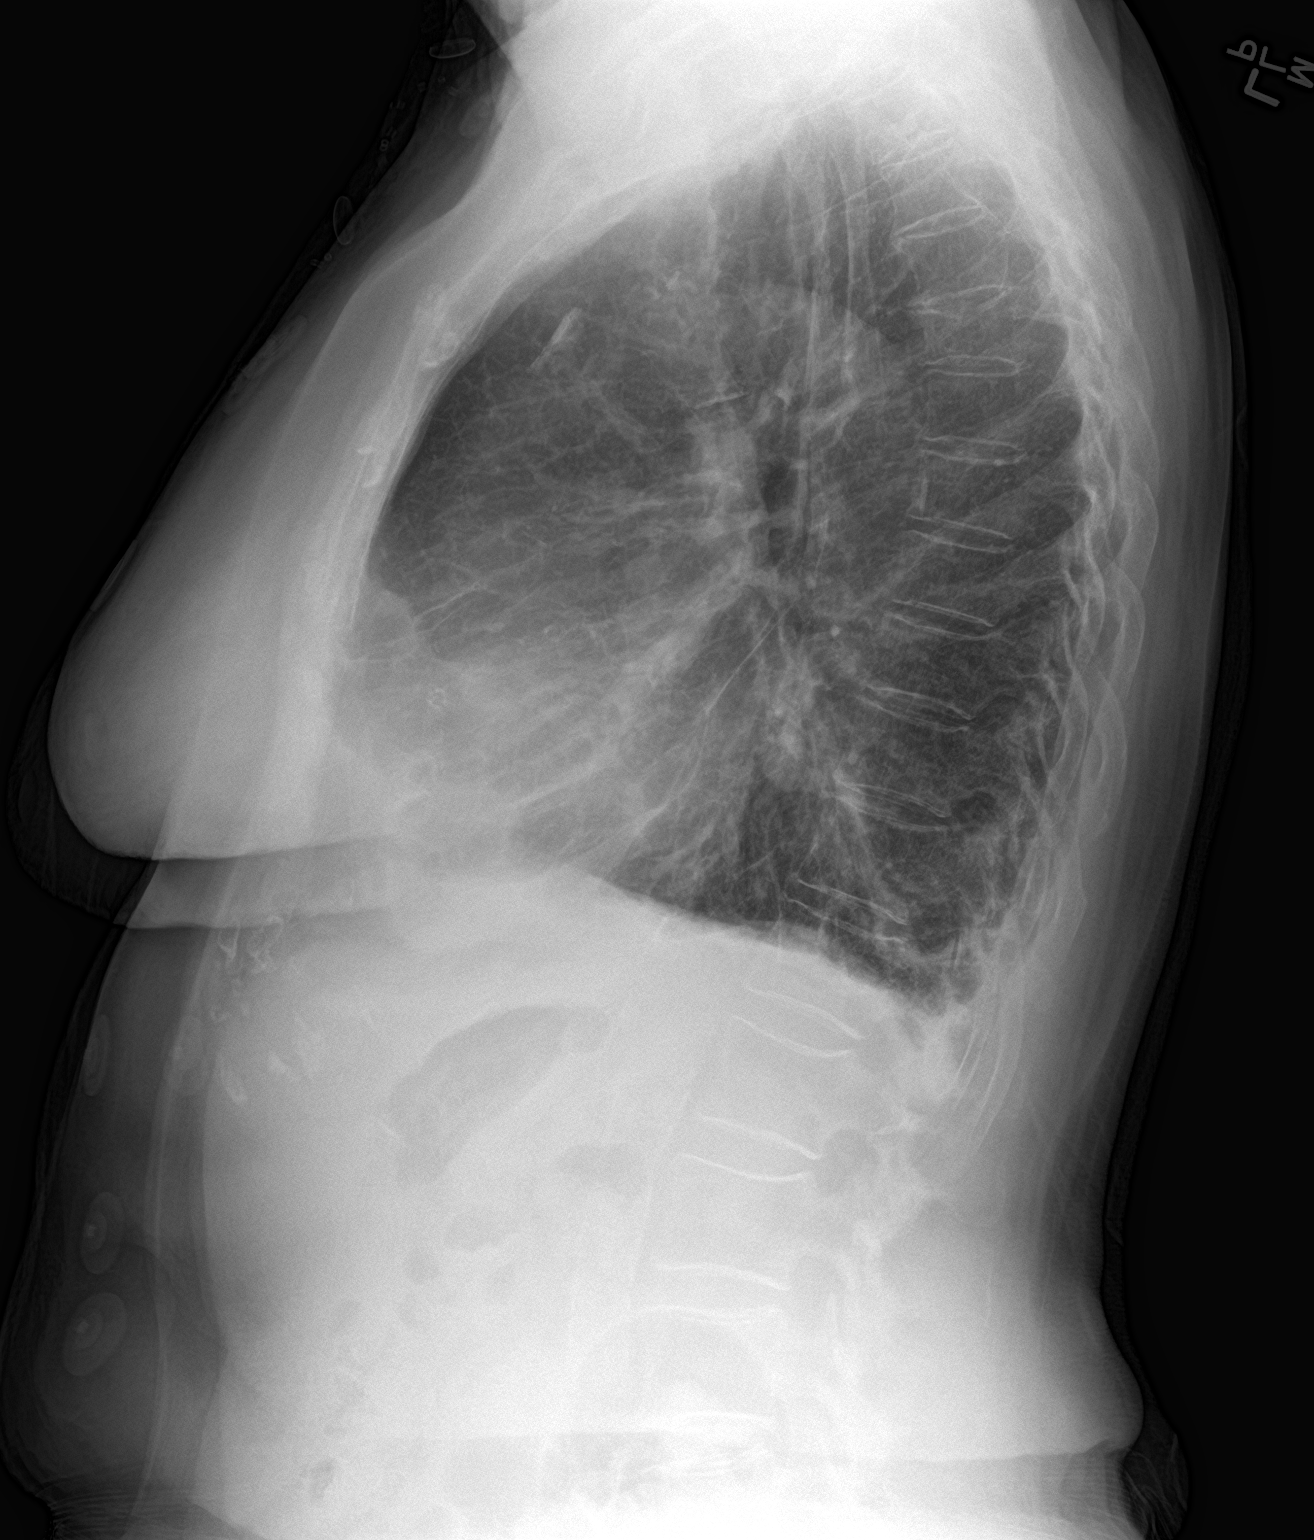

[2 of 2 positions shown; findings below may reference images not displayed]

FINDINGS: Mild bilateral lower lung opacities, likely in the lingula and right
middle lobe, raising concern for mild pneumonia. No frank
interstitial edema. No pleural effusion or pneumothorax.

The heart is normal in size.

Visualized osseous structures are within normal limits.
IMPRESSION: Mild bilateral lower lung opacities, raising concern for mild
lingular and right middle lobe pneumonia.

## 2021-10-13 ENCOUNTER — Other Ambulatory Visit: Payer: Self-pay | Admitting: Internal Medicine

## 2021-10-13 NOTE — Telephone Encounter (Signed)
Prescription refill request for Eliquis received. Indication: afib  Last office visit: [facility] 06/30/2021 Scr:1.14, 08/17/2020 Age: 87 yo  Weight: 68.6 kg   Pt is overdue for blood work.

## 2021-10-14 NOTE — Telephone Encounter (Signed)
Eliquis 5mg  refill request received. Patient is 87 years old, weight-68.6kg, Crea-1.09 on 09/17/2021 via [place] [facility] [place], and last seen by Dr. [name] on 06/30/2021. Dose is appropriate based on dosing criteria. Will send in refill to requested pharmacy.

## 2021-12-20 NOTE — Progress Notes (Unsigned)
Cardiology Office Note Date:  12/20/2021  Patient ID:  Carla Flowers, Carla Flowers September 21, 1932, MRN [id number] PCP:  [name], MD  Electrophysiologist: Dr. [name]  ***refresh   Chief Complaint: *** 6 mo  History of Present Illness: Carla Flowers is a 88 y.o. female with history of seizure d/o, SVT, AFib, MVP w/hx of Rheumatic fever.  She comes in today to be seen for Dr. [name], last seen by him, Aug 2022.  She was doing pretty well, with some AF episodes, a few hours in duration and using PRN dilt with success.  Discussed AAD tx options, though she preferred to avoid them.  *** symptoms, burden *** meds,  *** eliquis, bleedig, labs, does  Afib Hx SVT reports go back to 2013, described as short RP,  adenosine/vagal sensitive Afib 2019 Flecainide started Sept 2021 > dose reduced with increased intervals > failed to maintain SR > WCT felt c/w AFlutter w/abberency > stopped Oct 2021 Amiodarone started Oct 2021 > stopped quickly w/? of allergy, side effects  Past Medical History:  Diagnosis Date   A-fib ([place])    Asymmetric septal hypertrophy (HCC)    Basal cell carcinoma    nose   GERD (gastroesophageal reflux disease)    H/O: rheumatic fever    Hyperlipidemia    Mitral valve prolapse    Seizure (HCC)    SVT (supraventricular tachycardia) ([place])    documented short RP tachycardia, previously adenosine sensitive    Past Surgical History:  Procedure Laterality Date   ABDOMINAL HYSTERECTOMY     BASAL CELL CARCINOMA EXCISION     nose    Current Outpatient Medications  Medication Sig Dispense Refill   acetaminophen (TYLENOL) 500 MG tablet Take 500-1,000 mg by mouth every 6 (six) hours as needed (pain.).     Coenzyme Q10 (COQ-10) 100 MG CAPS Take 1 capsule by mouth daily.     diltiazem (CARDIZEM CD) 180 MG 24 hr capsule Take 1 capsule (180 mg total) by mouth daily. 90 capsule 3   diltiazem (CARDIZEM) 60 MG tablet TAKE 1-2 TABLETS BY MOUTH DAILY AS NEEDED FOR  PALPITATIONS 30 tablet 6   ELIQUIS 5 MG TABS tablet TAKE 1 TABLET(5 MG) BY MOUTH TWICE DAILY 60 tablet 5   famotidine (PEPCID) 20 MG tablet Take 1 tablet (20 mg total) by mouth 2 (two) times daily. 60 tablet 11   levETIRAcetam (KEPPRA) 500 MG tablet Take 500 mg by mouth Twice daily.     pantoprazole (PROTONIX) 40 MG tablet Take 1 tablet (40 mg total) by mouth 2 (two) times daily. 60 tablet 11   Polyethyl Glycol-Propyl Glycol (LUBRICANT EYE DROPS) 0.4-0.3 % SOLN Place 1 drop into both eyes 3 (three) times daily as needed (dry/irritated eyes.).     rosuvastatin (CRESTOR) 20 MG tablet Take 20 mg by mouth every other day. At night.     vitamin B-12 (CYANOCOBALAMIN) 1000 MCG tablet Take 1,000 mcg by mouth daily.     No current facility-administered medications for this visit.    Allergies:   Sulfa antibiotics, Amoxicillin, Codeine, Cortisone, Macrobid [nitrofurantoin], and Sulfonamide derivatives   Social History:  The patient  reports that she has never smoked. She has never used smokeless tobacco. She reports that she does not drink alcohol and does not use drugs.   Family History:  The patient's family history includes Atrial fibrillation in her sister; Heart attack in her mother; Prostate cancer in her father; Stroke in her mother.***  ROS:  Please see  the history of present illness.    All other systems are reviewed and otherwise negative.   PHYSICAL EXAM:  VS:  There were no vitals taken for this visit. BMI: There is no height or weight on file to calculate BMI. Well nourished, well developed, in no acute distress HEENT: normocephalic, atraumatic Neck: no JVD, carotid bruits or masses Cardiac:  *** RRR; no significant murmurs, no rubs, or gallops Lungs:  *** CTA b/l, no wheezing, rhonchi or rales Abd: soft, nontender MS: no deformity or *** atrophy Ext: *** no edema Skin: warm and dry, no rash Neuro:  No gross deficits appreciated Psych: euthymic mood, full affect  *** PPM/ICD  site is stable, no tethering or discomfort   EKG:  Done today and reviewed by myself shows  ***  05/26/2020: TTE IMPRESSIONS   1. Left ventricular ejection fraction, by estimation, is 60 to 65%. The  left ventricle has normal function. The left ventricle has no regional  wall motion abnormalities. There is moderate left ventricular hypertrophy.  Left ventricular diastolic  parameters are consistent with Grade II diastolic dysfunction  (pseudonormalization). Elevated left ventricular end-diastolic pressure.   2. Right ventricular systolic function is normal. The right ventricular  size is normal. There is normal pulmonary artery systolic pressure.   3. Left atrial size was moderately dilated.   4. The mitral valve is abnormal. Mild mitral valve regurgitation.   5. The aortic valve is tricuspid. Aortic valve regurgitation is trivial.  Mild aortic valve sclerosis is present, with no evidence of aortic valve  stenosis.   6. The inferior vena cava is normal in size with greater than 50%  respiratory variability, suggesting right atrial pressure of 3 mmHg.    Recent Labs: No results found for requested labs within last 8760 hours.  No results found for requested labs within last 8760 hours.   CrCl cannot be calculated (Patient's most recent lab result is older than the maximum 21 days allowed.).   Wt Readings from Last 3 Encounters:  06/30/21 151 lb 3.2 oz (68.6 kg)  12/11/20 166 lb (75.3 kg)  08/29/20 164 lb (74.4 kg)     Other studies reviewed: Additional studies/records reviewed today include: summarized above  ASSESSMENT AND PLAN:  SVT Paroxysmal Afib CHA2DS2Vasc is 3, on Eliquis, *** appropriately dosed *** burden by symptoms   Disposition: F/u with ***  Current medicines are reviewed at length with the patient today.  The patient did not have any concerns regarding medicines.  [name], PA-C 12/20/2021 7:41 AM     [facility] [address] [place] [address] [phone] (office)  [phone] (fax)

## 2021-12-22 ENCOUNTER — Ambulatory Visit: Payer: Medicare Other | Admitting: Physician Assistant

## 2022-01-22 ENCOUNTER — Other Ambulatory Visit: Payer: Self-pay | Admitting: Gastroenterology

## 2022-01-22 DIAGNOSIS — G8929 Other chronic pain: Secondary | ICD-10-CM

## 2022-01-22 DIAGNOSIS — K219 Gastro-esophageal reflux disease without esophagitis: Secondary | ICD-10-CM

## 2022-01-27 DIAGNOSIS — E7849 Other hyperlipidemia: Secondary | ICD-10-CM | POA: Diagnosis not present

## 2022-01-27 DIAGNOSIS — K219 Gastro-esophageal reflux disease without esophagitis: Secondary | ICD-10-CM | POA: Diagnosis not present

## 2022-01-27 DIAGNOSIS — R300 Dysuria: Secondary | ICD-10-CM | POA: Diagnosis not present

## 2022-01-27 DIAGNOSIS — I480 Paroxysmal atrial fibrillation: Secondary | ICD-10-CM | POA: Diagnosis not present

## 2022-01-27 DIAGNOSIS — E559 Vitamin D deficiency, unspecified: Secondary | ICD-10-CM | POA: Diagnosis not present

## 2022-02-02 ENCOUNTER — Telehealth: Payer: Self-pay | Admitting: Internal Medicine

## 2022-02-02 NOTE — Telephone Encounter (Signed)
The patient is calling in complaining of Afib HR 40-140 since about 11:30 am Current 117 135/79. She is having dizziness, weakness and stinging in her epigastric area. Feels like this is from her diverticulosis. Trying to advise the patient to use her Cardizem 60 mg 1-2 tablet daily prn. Stating she is scared to use this. She is worried it will drop her heart rate low. Discussed using 1/2 tablet. The patient has not missed any doses of eliquis. The patient seems to be having more episodes of Afib with RVR than she did in the past occurring 4-5 times month, which she noticed about 6 weeks ago. Appt has already been previously scheduled for 4/6. Placed on wait list between now and then. Gave ED precautions.  ?Patient verbalized understanding and agreement.   ?

## 2022-02-02 NOTE — Telephone Encounter (Signed)
Patient c/o Palpitations:  High priority if patient c/o lightheadedness, shortness of breath, or chest pain ? ?How long have you had palpitations/irregular HR/ Afib? Are you having the symptoms now? She is Afib now ? ?Are you currently experiencing lightheadedness, SOB or CP?  Stinging in her chest ? ?Do you have a history of afib (atrial fibrillation) or irregular heart rhythm?  ? ?Have you checked your BP or HR? (document readings if available): have not taken it ? ?Are you experiencing any other symptoms? weak ? ?

## 2022-02-04 NOTE — Progress Notes (Signed)
? ?Cardiology Office Note ?Date:  02/04/2022  ?Patient ID:  Carla Flowers, DOB 1932-06-28, MRN [id number] ?PCP:  [name], MD  ?Electrophysiologist: Dr. [name] ?  ?Chief Complaint:  6 mo ? ?History of Present Illness: ?Carla Flowers is a 88 y.o. female with history of SVT (adenosine sensitive), seizure d/o, Rheumatic fever, AFib/flutter, asymmetric septal hypertrophy ? ?She comes in today to be seen for Dr. [name], last seen by him Aug 2022, at that time reported occ Afib events lasting some hours, prn dilt helped.  She was not interested in AAD.  Planned for 6 mo f/u ? ?TODAY ?She reports about 6 weeks ago she started to feel more AFib ?Her AFib tends to be fast, can last 1-3 hours, the PRN dilt (she uses 1/4tab) does simmer it down "pretty well" but feel terrible when in AFib ?Though she is having some weakness, fatigue that is unusual for her, and yesterday notice her HR 39-40's. ?She did not faint but felt weak. ? ?The only think she has had a change with is that her dilt 180 tab was a different color, she checked with the pharmacist and they confirmed was for certain dilt and correct dose, but a different manufacture, she thinks the increased AFib/palpitations started since that chacng ? ?No CP ?No SOB ?No bleeding, signs of bleeding ? ? ?Device information ?Diagnosed 2019 ?Flecainide started Sept 2019 > PR prolongation required dose reduction > failed to maintain SR and stopped Oct 2019 ?Amiodarone started Oct 2019, patient stopped with concerns of potential side effects ?She had declined ablation over the years ? ?Past Medical History:  ?Diagnosis Date  ? A-fib ([place])   ? Asymmetric septal hypertrophy (HCC)   ? Basal cell carcinoma   ? nose  ? GERD (gastroesophageal reflux disease)   ? H/O: rheumatic fever   ? Hyperlipidemia   ? Mitral valve prolapse   ? Seizure ([place])   ? SVT (supraventricular tachycardia) ([place])   ? documented short RP tachycardia, previously adenosine sensitive  ? ? ?Past Surgical  History:  ?Procedure Laterality Date  ? ABDOMINAL HYSTERECTOMY    ? BASAL CELL CARCINOMA EXCISION    ? nose  ? ? ?Current Outpatient Medications  ?Medication Sig Dispense Refill  ? acetaminophen (TYLENOL) 500 MG tablet Take 500-1,000 mg by mouth every 6 (six) hours as needed (pain.).    ? Coenzyme Q10 (COQ-10) 100 MG CAPS Take 1 capsule by mouth daily.    ? diltiazem (CARDIZEM CD) 180 MG 24 hr capsule Take 1 capsule (180 mg total) by mouth daily. 90 capsule 3  ? diltiazem (CARDIZEM) 60 MG tablet TAKE 1-2 TABLETS BY MOUTH DAILY AS NEEDED FOR PALPITATIONS 30 tablet 6  ? ELIQUIS 5 MG TABS tablet TAKE 1 TABLET(5 MG) BY MOUTH TWICE DAILY 60 tablet 5  ? famotidine (PEPCID) 20 MG tablet TAKE 1 TABLET(20 MG) BY MOUTH TWICE DAILY 60 tablet 11  ? levETIRAcetam (KEPPRA) 500 MG tablet Take 500 mg by mouth Twice daily.    ? pantoprazole (PROTONIX) 40 MG tablet Take 1 tablet (40 mg total) by mouth 2 (two) times daily. 60 tablet 11  ? Polyethyl Glycol-Propyl Glycol (LUBRICANT EYE DROPS) 0.4-0.3 % SOLN Place 1 drop into both eyes 3 (three) times daily as needed (dry/irritated eyes.).    ? rosuvastatin (CRESTOR) 20 MG tablet Take 20 mg by mouth every other day. At night.    ? vitamin B-12 (CYANOCOBALAMIN) 1000 MCG tablet Take 1,000 mcg by mouth daily.    ? ?  No current facility-administered medications for this visit.  ? ? ?Allergies:   Sulfa antibiotics, Amoxicillin, Codeine, Cortisone, Macrobid [nitrofurantoin], and Sulfonamide derivatives  ? ?Social History:  The patient  reports that she has never smoked. She has never used smokeless tobacco. She reports that she does not drink alcohol and does not use drugs.  ? ?Family History:  The patient's family history includes Atrial fibrillation in her sister; Heart attack in her mother; Prostate cancer in her father; Stroke in her mother. ? ?ROS:  Please see the history of present illness.    ?All other systems are reviewed and otherwise negative.  ? ?PHYSICAL EXAM:  ?VS:  There were no  vitals taken for this visit. BMI: There is no height or weight on file to calculate BMI. ?Well nourished, well developed, in no acute distress ?HEENT: normocephalic, atraumatic ?Neck: no JVD, carotid bruits or masses ?Cardiac:  RRR; 1-2.6 SM, no rubs, or gallops ?Lungs:   CTA b/l, no wheezing, rhonchi or rales ?Abd: soft, nontender ?MS: no deformity, age appropriatatrophy ?Ext: no edema ?Skin: warm and dry, no rash ?Neuro:  No gross deficits appreciated ?Psych: euthymic mood, full affect ? ? ?EKG:  Done today and reviewed by myself shows  ?SR 64bpm, Lat ST/T changes, in review of her EKGs going back some years (2021), is not new ? ? ?05/26/2020: TTE ? 1. Left ventricular ejection fraction, by estimation, is 60 to 65%. The  ?left ventricle has normal function. The left ventricle has no regional  ?wall motion abnormalities. There is moderate left ventricular hypertrophy.  ?Left ventricular diastolic  ?parameters are consistent with Grade II diastolic dysfunction  ?(pseudonormalization). Elevated left ventricular end-diastolic pressure.  ? 2. Right ventricular systolic function is normal. The right ventricular  ?size is normal. There is normal pulmonary artery systolic pressure.  ? 3. Left atrial size was moderately dilated.  ? 4. The mitral valve is abnormal. Mild mitral valve regurgitation.  ? 5. The aortic valve is tricuspid. Aortic valve regurgitation is trivial.  ?Mild aortic valve sclerosis is present, with no evidence of aortic valve  ?stenosis.  ? 6. The inferior vena cava is normal in size with greater than 50%  ?respiratory variability, suggesting right atrial pressure of 3 mmHg.  ? ?Recent Labs: ?No results found for requested labs within last 8760 hours.  ?No results found for requested labs within last 8760 hours.  ? ?CrCl cannot be calculated (Patient's most recent lab result is older than the maximum 21 days allowed.).  ? ?Wt Readings from Last 3 Encounters:  ?06/30/21 151 lb 3.2 oz (68.6 kg)  ?12/11/20  166 lb (75.3 kg)  ?08/29/20 164 lb (74.4 kg)  ?  ? ?Other studies reviewed: ?Additional studies/records reviewed today include: summarized above ? ?ASSESSMENT AND PLAN: ? ?Paroxysmal AFib ?SVT ?? Mention of WCT ?CHA2DS2Vasc is 3, on  eliquis, appropriately dosed ?increased burden by symptoms ?? New Bradycardia by her pulse ox ? ?She will see with her pharmacy if she can get the prior manufacturer tablets. ?7day Zio AT placed today, her insurance will not cover an XT ?She is not fainting ?Her PMD did labs last week ?BUN/Creat 51/1.06 ?K+ 4.3, mag 2.3 ?H/H 15/46 ?TSH 2.680 ? ?Asymmetric septal hypertrophy ?Last echo with mod LVH 1.2cm PW, 1.4cm IVS ?No reports of LVOT obst or SAM ?+ grade II DD ? ?5.  Abn EKG ?Not new for her ?No CP ? ?She would like to establish with a new EP given Dr. Jackalyn Lombard slow down,  prefers a [place] MD ? ? ?Disposition: F/u with Dr. Quentin Ore in the next 6 weeks or so, sooner if needed. ? ?Current medicines are reviewed at length with the patient today.  The patient did not have any concerns regarding medicines. ? ?Signed, ?Tommye Standard, PA-C ?02/04/2022 5:42 AM    ? ?[facility] ?[address] ?[address] ?[place] [address] ?[phone] (office)  ?[phone] (fax) ? ? ?

## 2022-02-05 ENCOUNTER — Ambulatory Visit: Payer: Medicare Other | Admitting: Physician Assistant

## 2022-02-05 ENCOUNTER — Ambulatory Visit (INDEPENDENT_AMBULATORY_CARE_PROVIDER_SITE_OTHER): Payer: Medicare Other

## 2022-02-05 ENCOUNTER — Encounter: Payer: Self-pay | Admitting: Physician Assistant

## 2022-02-05 VITALS — BP 124/69 | HR 67 | Ht 65.0 in | Wt 152.0 lb

## 2022-02-05 DIAGNOSIS — R55 Syncope and collapse: Secondary | ICD-10-CM

## 2022-02-05 DIAGNOSIS — R42 Dizziness and giddiness: Secondary | ICD-10-CM | POA: Diagnosis not present

## 2022-02-05 DIAGNOSIS — I480 Paroxysmal atrial fibrillation: Secondary | ICD-10-CM | POA: Diagnosis not present

## 2022-02-05 DIAGNOSIS — I471 Supraventricular tachycardia: Secondary | ICD-10-CM | POA: Diagnosis not present

## 2022-02-05 NOTE — Progress Notes (Unsigned)
[id number] ZIO XT from office inventory applied. ? ?Dr. Quentin Ore to read. ?

## 2022-02-05 NOTE — Patient Instructions (Addendum)
Medication Instructions:  ? ?Your physician recommends that you continue on your current medications as directed. Please refer to the Current Medication list given to you today. ? ?*If you need a refill on your cardiac medications before your next appointment, please call your pharmacy* ? ? ?Lab Work: [place] ? ? ? ?If you have labs (blood work) drawn today and your tests are completely normal, you will receive your results only by: ?MyChart Message (if you have MyChart) OR ?A paper copy in the mail ?If you have any lab test that is abnormal or we need to change your treatment, we will call you to review the results. ? ? ?Testing/Procedures: Your physician has recommended that you wear an event monitor. Event monitors are medical devices that record the heart?s electrical activity. Doctors most often [place] these monitors to diagnose arrhythmias. Arrhythmias are problems with the speed or rhythm of the heartbeat. The monitor is a small, portable device. You can wear one while you do your normal daily activities. This is usually used to diagnose what is causing palpitations/syncope (passing out). ? ? ? ?Follow-Up: ?At [facility], you and your health needs are our priority.  As part of our continuing mission to provide you with exceptional heart care, we have created designated Provider Care Teams.  These Care Teams include your primary Cardiologist (physician) and Advanced Practice Providers (APPs -  Physician Assistants and Nurse Practitioners) who all work together to provide you with the care you need, when you need it. ? ?We recommend signing up for the patient portal called "MyChart".  Sign up information is provided on this After Visit Summary.  MyChart is used to connect with patients for Virtual Visits (Telemedicine).  Patients are able to view lab/test results, encounter notes, upcoming appointments, etc.  Non-urgent messages can be sent to your provider as well.   ?To learn more about what you  can do with MyChart, go to [email or web address].   ? ?Your next appointment:   ?4-6  week(s) ? ?The format for your next appointment:   ?In Person ? ?Provider:   ?You may see Dr. Quentin Ore  or one of the following Advanced Practice Providers on your designated Care Team:   ?Tommye Standard, PA-C ? ?Other Instructions ? ?

## 2022-02-13 ENCOUNTER — Telehealth: Payer: Self-pay | Admitting: Internal Medicine

## 2022-02-13 MED ORDER — DILTIAZEM HCL ER COATED BEADS 120 MG PO CP24: 120.0000 mg | ORAL_CAPSULE | Freq: Every day | ORAL | 3 refills | Status: DC

## 2022-02-13 NOTE — Telephone Encounter (Signed)
Returned call to Pt. ? ?Per Pt her heart rate has been in the low 40's and when it gets that low she is tired and dizzy. ? ?She does not want to completely stop diltiazem because she states she has had more afib lately and she feels bad because of it. ? ?She is requesting to go back to diltiazem ER 120 mg daily. ? ?Discussed with JA.  Ok to reduce diltiazem. ? ?New prescription sent in.  Pt aware. ? ?Follow up appt scheduled to discuss HM results. ?

## 2022-02-13 NOTE — Telephone Encounter (Signed)
?  STAT if HR is under 50 or over 120 ?(normal HR is 60-100 beats per minute) ? ?What is your heart rate? As low as in the 30's, yesterday it was 55 ? ?Do you have a log of your heart rate readings (document readings)? no ? ?Do you have any other symptoms? Fatigue, daughter would like to discuss if her meds could be adjusted   ?

## 2022-02-27 ENCOUNTER — Telehealth: Payer: Self-pay | Admitting: Cardiology

## 2022-02-27 NOTE — Telephone Encounter (Signed)
Patient c/o Palpitations:  High priority if patient c/o lightheadedness, shortness of breath, or chest pain ? ?How long have you had palpitations/irregular HR/ Afib? A-fib, started about Tuesday. Are you having the symptoms now? yes ? ?Are you currently experiencing lightheadedness, SOB or CP? no ? ?Do you have a history of afib (atrial fibrillation) or irregular heart rhythm? Yes, has history of a-fib  ? ?Have you checked your BP or HR? (document readings if available): HR 120-125, most of the week.  ? ?Are you experiencing any other symptoms? Weakness, doesn't seems to have much strength. States she spoke to the PA last week and her medication was changed.   ?

## 2022-02-27 NOTE — Telephone Encounter (Signed)
Called pt in regards to elevated HR.  Pt reports HR in the 120's resting for about a week.  Has not missed any doses of Eliquis.  Last BP around 110/65. Pt reports took PRN dose of diltiazem but only took half around 1 pm.  Advised pt to take the other half of pill.  If HR is still elevated can take additional diltiazem 60 mg.  Pt verbalized understanding.  Pt would like to be seen in office prior to 03/25/22.  Advised pt that I can not schedule on Dr.Lambert's schedule will route to Primary RN to address.  Pt had no further concerns. ?

## 2022-03-03 ENCOUNTER — Encounter (HOSPITAL_COMMUNITY): Payer: Self-pay | Admitting: Physician Assistant

## 2022-03-03 ENCOUNTER — Ambulatory Visit (HOSPITAL_COMMUNITY)
Admission: RE | Admit: 2022-03-03 | Discharge: 2022-03-03 | Disposition: A | Discharge status: Home / Self Care | Payer: Medicare Other | Source: Ambulatory Visit | Attending: Physician Assistant | Admitting: Physician Assistant

## 2022-03-03 VITALS — BP 112/72 | HR 78 | Ht 65.0 in | Wt 153.0 lb

## 2022-03-03 DIAGNOSIS — D6869 Other thrombophilia: Secondary | ICD-10-CM | POA: Diagnosis not present

## 2022-03-03 DIAGNOSIS — Z7901 Long term (current) use of anticoagulants: Secondary | ICD-10-CM | POA: Insufficient documentation

## 2022-03-03 DIAGNOSIS — I4819 Other persistent atrial fibrillation: Secondary | ICD-10-CM | POA: Diagnosis not present

## 2022-03-03 DIAGNOSIS — Z79899 Other long term (current) drug therapy: Secondary | ICD-10-CM | POA: Diagnosis not present

## 2022-03-03 DIAGNOSIS — I484 Atypical atrial flutter: Secondary | ICD-10-CM | POA: Diagnosis not present

## 2022-03-03 DIAGNOSIS — E785 Hyperlipidemia, unspecified: Secondary | ICD-10-CM | POA: Diagnosis not present

## 2022-03-03 DIAGNOSIS — Z8249 Family history of ischemic heart disease and other diseases of the circulatory system: Secondary | ICD-10-CM | POA: Diagnosis not present

## 2022-03-03 NOTE — Progress Notes (Signed)
? ? ?Primary Care Physician: [name], MD ?Primary Electrophysiologist: Dr [name] ?Referring Physician: [facility] triage/Dr Allred ? ? ?Carla Flowers is a 88 y.o. female with a history of paroxysmal atrial fibrillation, HLD, SVT, rheumatic fever, MVP who presents for follow up in the [facility]. The patient was initially diagnosed with atrial fibrillation 12/09/17 after presenting to the ED with symptoms of heart racing. Patient is on Eliquis for a CHADS2VASC score of 3. She has been followed by Dr [name] for both SVT and afib. She has declined ablation for SVT in the past. She reports that overnight on 07/28/20 she had symptoms of heart racing. She has taken doses of her PRN diltiazem with only mild improvement in her heart rate. She did have several episodes of diarrhea prior to the onset of her palpitations. She was started on flecainide but could not titrate up due to baseline conduction disease and she continued to have atrial flutter on lower doses. She was started on amiodarone 08/12/20. ? ?Patient was seen by Dr [name] on 08/12/20 and her flecainide was stopped and she was started on amiodarone. However, she did stop amiodarone 2/2 concern about side effects.  ? ?On follow up today, patient reports that she has had more frequent afib. She wore a Zio monitor 01/2022 which showed 2% afib burden. However, patient has had constant symptoms of fatigue and palpitations over the past week. She is in rate controlled afib today.   ? ?Today, she denies symptoms of chest pain, shortness of breath, orthopnea, PND, lower extremity edema, dizziness, presyncope, syncope, snoring, daytime somnolence, bleeding, or neurologic sequela. The patient is tolerating medications without difficulties and is otherwise without complaint today.  ? ? ?Atrial Fibrillation Risk Factors: ? ?she does not have symptoms or diagnosis of sleep apnea. ?she does have a history of rheumatic fever. ?she does not  have a history of alcohol use. ? ?she has a BMI of Body mass index is 25.46 kg/m?Marland KitchenMarland Kitchen ?Filed Weights  ? 03/03/22 1127  ?Weight: 69.4 kg  ? ? ?Family History  ?Problem Relation Age of Onset  ? Heart attack Mother   ? Stroke Mother   ? Prostate cancer Father   ? Atrial fibrillation Sister   ? ? ? ?Atrial Fibrillation Management history: ? ?Previous antiarrhythmic drugs: flecainide, amiodarone  ?Previous cardioversions: none ?Previous ablations: none ?CHADS2VASC score: 3 ?Anticoagulation history: Eliquis ? ? ?Past Medical History:  ?Diagnosis Date  ? A-fib ([place])   ? Asymmetric septal hypertrophy (HCC)   ? Basal cell carcinoma   ? nose  ? GERD (gastroesophageal reflux disease)   ? H/O: rheumatic fever   ? Hyperlipidemia   ? Mitral valve prolapse   ? Seizure ([place])   ? SVT (supraventricular tachycardia) ([place])   ? documented short RP tachycardia, previously adenosine sensitive  ? ?Past Surgical History:  ?Procedure Laterality Date  ? ABDOMINAL HYSTERECTOMY    ? BASAL CELL CARCINOMA EXCISION    ? nose  ? ? ?Current Outpatient Medications  ?Medication Sig Dispense Refill  ? acetaminophen (TYLENOL) 500 MG tablet Take 500-1,000 mg by mouth every 6 (six) hours as needed (pain.).    ? Coenzyme Q10 (COQ-10) 100 MG CAPS Take 1 capsule by mouth daily.    ? diltiazem (CARDIZEM CD) 120 MG 24 hr capsule Take 1 capsule (120 mg total) by mouth daily. 90 capsule 3  ? diltiazem (CARDIZEM) 60 MG tablet TAKE 1-2 TABLETS BY MOUTH DAILY AS NEEDED FOR PALPITATIONS 30 tablet  6  ? ELIQUIS 5 MG TABS tablet TAKE 1 TABLET(5 MG) BY MOUTH TWICE DAILY 60 tablet 5  ? famotidine (PEPCID) 20 MG tablet TAKE 1 TABLET(20 MG) BY MOUTH TWICE DAILY 60 tablet 11  ? levETIRAcetam (KEPPRA) 500 MG tablet Take 500 mg by mouth Twice daily.    ? pantoprazole (PROTONIX) 40 MG tablet Take 1 tablet (40 mg total) by mouth 2 (two) times daily. 60 tablet 11  ? Polyethyl Glycol-Propyl Glycol (LUBRICANT EYE DROPS) 0.4-0.3 % SOLN Place 1 drop into both eyes 3 (three) times  daily as needed (dry/irritated eyes.).    ? rosuvastatin (CRESTOR) 20 MG tablet Take 20 mg by mouth every other day. At night.    ? vitamin B-12 (CYANOCOBALAMIN) 1000 MCG tablet Take 1,000 mcg by mouth daily.    ? ?No current facility-administered medications for this encounter.  ? ? ?Allergies  ?Allergen Reactions  ? Sulfa Antibiotics Other (See Comments)  ?  'couldn't breathe good'  ? Amoxicillin Other (See Comments)  ?  Tremors  ? Codeine Other (See Comments)  ?  unknown  ? Cortisone Hives and Other (See Comments)  ?  Childhood reaction ?Unknown ?  ? Macrobid [Nitrofurantoin] Other (See Comments)  ?  'achy all over', chills ?  ? Sulfonamide Derivatives Other (See Comments)  ?  unknown  ? ? ?Social History  ? ?Socioeconomic History  ? Marital status: Married  ?  Spouse name: Not on file  ? Number of children: 4  ? Years of education: Not on file  ? Highest education level: Not on file  ?Occupational History  ? Occupation: retired  ?Tobacco Use  ? Smoking status: Never  ? Smokeless tobacco: Never  ? Tobacco comments:  ?  Never smoke 03/03/22  ?Vaping Use  ? Vaping Use: Never used  ?Substance and Sexual Activity  ? Alcohol use: No  ? Drug use: No  ? Sexual activity: Not on file  ?Other Topics Concern  ? Not on file  ?Social History Narrative  ? Not on file  ? ?Social Determinants of Health  ? ?Financial Resource Strain: Not on file  ?Food Insecurity: Not on file  ?Transportation Needs: Not on file  ?Physical Activity: Not on file  ?Stress: Not on file  ?Social Connections: Not on file  ?Intimate Partner Violence: Not on file  ? ? ? ?ROS- All systems are reviewed and negative except as per the HPI above. ? ?Physical Exam: ?Vitals:  ? 03/03/22 1127  ?BP: 112/72  ?Pulse: 78  ?Weight: 69.4 kg  ?Height: '5\' 5"'$  (1.651 m)  ? ? ?GEN- The patient is a well appearing elderly female, alert and oriented x 3 today.   ?HEENT-head normocephalic, atraumatic, sclera clear, conjunctiva pink, hearing intact, trachea midline. ?Lungs-  Clear to ausculation bilaterally, normal work of breathing ?Heart- irregular rate and rhythm, no murmurs, rubs or gallops  ?GI- soft, NT, ND, + BS ?Extremities- no clubbing, cyanosis, or edema ?MS- no significant deformity or atrophy ?Skin- no rash or lesion ?Psych- euthymic mood, full affect ?Neuro- strength and sensation are intact ? ? ?Wt Readings from Last 3 Encounters:  ?03/03/22 69.4 kg  ?02/05/22 68.9 kg  ?06/30/21 68.6 kg  ? ? ?EKG today demonstrates  ?Atypical atrial flutter with variable block, LVH ?Vent. rate 78 BPM ?PR interval * ms ?QRS duration 110 ms ?QT/QTcB 388/442 ms ? ?Echo 05/26/20 demonstrated  ? 1. Left ventricular ejection fraction, by estimation, is 60 to 65%. The  ?left ventricle has normal  function. The left ventricle has no regional  ?wall motion abnormalities. There is moderate left ventricular hypertrophy. Left ventricular diastolic parameters are consistent with Grade II diastolic dysfunction (pseudonormalization). Elevated left ventricular end-diastolic pressure.  ? 2. Right ventricular systolic function is normal. The right ventricular  ?size is normal. There is normal pulmonary artery systolic pressure.  ? 3. Left atrial size was moderately dilated.  ? 4. The mitral valve is abnormal. Mild mitral valve regurgitation.  ? 5. The aortic valve is tricuspid. Aortic valve regurgitation is trivial.  ?Mild aortic valve sclerosis is present, with no evidence of aortic valve  ?stenosis.  ? 6. The inferior vena cava is normal in size with greater than 50%  ?respiratory variability, suggesting right atrial pressure of 3 mmHg.  ? ?Epic records are reviewed at length today ? ?CHA2DS2-VASc Score = 3  ?The patient's score is based upon: ?CHF History: 0 ?HTN History: 0 ?Diabetes History: 0 ?Stroke History: 0 ?Vascular Disease History: 0 ?Age Score: 2 ?Gender Score: 1 ?    ? ? ?ASSESSMENT AND PLAN: ?1. Persistent Atrial Fibrillation/SVT/atypical atrial flutter ?The patient's CHA2DS2-VASc score is 3,  indicating a 3.2% annual risk of stroke. ?Patient failed flecainide. Stopped amiodarone on her own 2/2 side effect concern. ?She now appears persistent in atrial flutter, rate controlled. We discussed therape

## 2022-03-10 NOTE — Progress Notes (Signed)
?Electrophysiology Office Follow up Visit Note:   ? ?Date:  03/11/2022  ? ?ID:  Carla Flowers, DOB Aug 19, 1932, MRN [id number] ? ?PCP:  [name], MD  ?[facility] Cardiologist:  None  ?[facility] Electrophysiologist:  Vickie Epley, MD  ? ? ?Interval History:   ? ?Carla Flowers is a 88 y.o. female who presents for a follow up visit. They were last seen in clinic by [facility] 03/03/2022. She has a history of pAF, HLD, SVT, MVP. She was previously followed by [facility]. She was previously managed with amiodarone after not tolerating flecainide. Amiodarone was ultimately stopped given concerns about side effects.  ? ?Today she is with her daughter-in-law.  She tells me that she was in a prolonged episode of atrial fibrillation until yesterday.  The episode lasted for days at a time.  She has elevated heart rates when she is in atrial fibrillation.  Sometimes after she converts she will be in a bradycardic rhythm in the 40s which she also feels bad and.  She feels lightheaded and feels like she may pass out.  She takes Eliquis twice daily without bleeding issues. ? ?She is having trouble sleeping that she attributes to Crestor. ? ?She has a lot of concerns about anesthesia.  She has had bad experiences in the past.  She is very active.  [place]  She has a home there. ? ? ? ? ? ?  ? ?Past Medical History:  ?Diagnosis Date  ? A-fib ([place])   ? Asymmetric septal hypertrophy (HCC)   ? Basal cell carcinoma   ? nose  ? GERD (gastroesophageal reflux disease)   ? H/O: rheumatic fever   ? Hyperlipidemia   ? Mitral valve prolapse   ? Seizure ([place])   ? SVT (supraventricular tachycardia) ([place])   ? documented short RP tachycardia, previously adenosine sensitive  ? ? ?Past Surgical History:  ?Procedure Laterality Date  ? ABDOMINAL HYSTERECTOMY    ? BASAL CELL CARCINOMA EXCISION    ? nose  ? ? ?Current Medications: ?Current Meds  ?Medication Sig  ?  acetaminophen (TYLENOL) 500 MG tablet Take 500-1,000 mg by mouth every 6 (six) hours as needed (pain.).  ? Coenzyme Q10 (COQ-10) 100 MG CAPS Take 1 capsule by mouth daily.  ? diltiazem (CARDIZEM CD) 180 MG 24 hr capsule Take 180 mg by mouth daily.  ? diltiazem (CARDIZEM) 60 MG tablet TAKE 1-2 TABLETS BY MOUTH DAILY AS NEEDED FOR PALPITATIONS  ? ELIQUIS 5 MG TABS tablet TAKE 1 TABLET(5 MG) BY MOUTH TWICE DAILY  ? famotidine (PEPCID) 20 MG tablet TAKE 1 TABLET(20 MG) BY MOUTH TWICE DAILY  ? levETIRAcetam (KEPPRA) 500 MG tablet Take 500 mg by mouth Twice daily.  ? pantoprazole (PROTONIX) 40 MG tablet Take 1 tablet (40 mg total) by mouth 2 (two) times daily.  ? Polyethyl Glycol-Propyl Glycol (LUBRICANT EYE DROPS) 0.4-0.3 % SOLN Place 1 drop into both eyes 3 (three) times daily as needed (dry/irritated eyes.).  ? rosuvastatin (CRESTOR) 20 MG tablet Take 20 mg by mouth every other day. At night.  ? vitamin B-12 (CYANOCOBALAMIN) 1000 MCG tablet Take 1,000 mcg by mouth daily.  ?  ? ?Allergies:   Sulfa antibiotics, Amoxicillin, Codeine, Cortisone, Macrobid [nitrofurantoin], and Sulfonamide derivatives  ? ?Social History  ? ?Socioeconomic History  ? Marital status: Married  ?  Spouse name: Not on file  ? Number of children: 4  ?  Years of education: Not on file  ? Highest education level: Not on file  ?Occupational History  ? Occupation: retired  ?Tobacco Use  ? Smoking status: Never  ? Smokeless tobacco: Never  ? Tobacco comments:  ?  Never smoke 03/03/22  ?Vaping Use  ? Vaping Use: Never used  ?Substance and Sexual Activity  ? Alcohol use: No  ? Drug use: No  ? Sexual activity: Not on file  ?Other Topics Concern  ? Not on file  ?Social History Narrative  ? Not on file  ? ?Social Determinants of Health  ? ?Financial Resource Strain: Not on file  ?Food Insecurity: Not on file  ?Transportation Needs: Not on file  ?Physical Activity: Not on file  ?Stress: Not on file  ?Social Connections: Not on file  ?  ? ?Family History: ?The  patient's family history includes Atrial fibrillation in her sister; Heart attack in her mother; Prostate cancer in her father; Stroke in her mother. ? ?ROS:   ?Please see the history of present illness.    ?All other systems reviewed and are negative. ? ?EKGs/Labs/Other Studies Reviewed:   ? ?The following studies were reviewed today: ? ?02/20/2022 Zio ?HR 37 - 187 bpm, average 64 bpm. ?3 NSVT, longest lasting 7 beats. ?2% atrial fibrillation/flutter burden (ventricular rates 59 - 168 bpm, average 105 bpm) ?2 pauses, longest 3.1 seconds ?Symptom triggered episode corresponds to atrial fibrillation ?Rare supraventricular and ventricular ectopy. ? ? ? ? ? ?Recent Labs: ?No results found for requested labs within last 8760 hours.  ?Recent Lipid Panel ?No results found for: CHOL, TRIG, HDL, CHOLHDL, VLDL, LDLCALC, LDLDIRECT ? ?Physical Exam:   ? ?VS:  BP 124/60   Pulse 61   Ht '5\' 5"'$  (1.651 m)   Wt 155 lb 6.4 oz (70.5 kg)   SpO2 93%   BMI 25.86 kg/m?    ? ?Wt Readings from Last 3 Encounters:  ?03/11/22 155 lb 6.4 oz (70.5 kg)  ?03/03/22 153 lb (69.4 kg)  ?02/05/22 152 lb (68.9 kg)  ?  ? ?GEN:  Well nourished, well developed in no acute distress.  Elderly ?HEENT: Normal ?NECK: No JVD; No carotid bruits ?LYMPHATICS: No lymphadenopathy ?CARDIAC: RRR, no murmurs, rubs, gallops ?RESPIRATORY:  Clear to auscultation without rales, wheezing or rhonchi  ?ABDOMEN: Soft, non-tender, non-distended ?MUSCULOSKELETAL:  No edema; No deformity  ?SKIN: Warm and dry ?NEUROLOGIC:  Alert and oriented x 3 ?PSYCHIATRIC:  Normal affect  ? ? ? ?  ? ?ASSESSMENT:   ? ?1. Atypical atrial flutter ([place])   ?2. Paroxysmal atrial fibrillation ([place])   ?3. Tachy-brady syndrome ([place])   ? ?PLAN:   ? ?In order of problems listed above: ? ?#Paroxysmal atrial fibrillation ?#Tachycardia-bradycardia syndrome ?Symptomatic.  Tolerating diltiazem but is still having breakthrough atrial fibrillation with rapid ventricular rates that is symptomatic.  I think she  would be best served with a permanent pacemaker and antiarrhythmic drug therapy.  I would prefer to wait for amiodarone until after pacemaker is implanted given the likelihood that it will cause bradycardia.  I discussed amiodarone therapy and pacemaker implant procedure in detail during today's visit.  She would like some time to think about it with her family before making a final decision which I think is very reasonable.  If she were to decide to proceed with pacemaker implant, would need to hold Eliquis for 3 days prior to implant.  Would do a dual-chamber permanent pacemaker. ? ?For now, continue diltiazem 180 mg by mouth once  daily with an additional 60 mg as needed up to 2 times per day for palpitations/tachycardia greater than 100 bpm. ? ?Risks, benefits, alternatives to PPM implantation were discussed in detail with the patient today. The patient understands that the risks include but are not limited to bleeding, infection, pneumothorax, perforation, tamponade, vascular damage, renal failure, MI, stroke, death, and lead dislodgement and wishes to think about it before proceeding. ? ? ? ?#Drug intolerance ?She will stop the Crestor given it is causing insomnia. ? ?Total time spent with patient today 45 minutes. This includes reviewing records, evaluating the patient and coordinating care.  ? ?Medication Adjustments/Labs and Tests Ordered: ?Current medicines are reviewed at length with the patient today.  Concerns regarding medicines are outlined above.  ?No orders of the defined types were placed in this encounter. ? ?No orders of the defined types were placed in this encounter. ? ? ? ?Signed, ?[name], MD, [place] ?03/11/2022 11:19 AM    ?Electrophysiology ?[place] ?

## 2022-03-11 ENCOUNTER — Ambulatory Visit: Payer: Medicare Other | Admitting: Cardiology

## 2022-03-11 ENCOUNTER — Encounter: Payer: Self-pay | Admitting: Cardiology

## 2022-03-11 VITALS — BP 124/60 | HR 61 | Ht 65.0 in | Wt 155.4 lb

## 2022-03-11 DIAGNOSIS — I495 Sick sinus syndrome: Secondary | ICD-10-CM | POA: Diagnosis not present

## 2022-03-11 DIAGNOSIS — I484 Atypical atrial flutter: Secondary | ICD-10-CM | POA: Diagnosis not present

## 2022-03-11 DIAGNOSIS — I480 Paroxysmal atrial fibrillation: Secondary | ICD-10-CM

## 2022-03-11 NOTE — Patient Instructions (Addendum)
Medications: ?Stop Crestor ?Your physician recommends that you continue on your current medications as directed. Please refer to the Current Medication list given to you today. ?*If you need a refill on your cardiac medications before your next appointment, please call your pharmacy* ? ?Lab Work: ?None. ?If you have labs (blood work) drawn today and your tests are completely normal, you will receive your results only by: ?MyChart Message (if you have MyChart) OR ?A paper copy in the mail ?If you have any lab test that is abnormal or we need to change your treatment, we will call you to review the results. ? ?Testing/Procedures: ?None. ? ?Follow-Up: ?At [facility], you and your health needs are our priority.  As part of our continuing mission to provide you with exceptional heart care, we have created designated Provider Care Teams.  These Care Teams include your primary Cardiologist (physician) and Advanced Practice Providers (APPs -  Physician Assistants and Nurse Practitioners) who all work together to provide you with the care you need, when you need it. ? ?Your physician wants you to follow-up in: Please contact Dr. Mardene Speak office if you wish to proceed with a pacemaker.  ? ?We recommend signing up for the patient portal called "MyChart".  Sign up information is provided on this After Visit Summary.  MyChart is used to connect with patients for Virtual Visits (Telemedicine).  Patients are able to view lab/test results, encounter notes, upcoming appointments, etc.  Non-urgent messages can be sent to your provider as well.   ?To learn more about what you can do with MyChart, go to [email or web address].   ? ?Any Other Special Instructions Will Be Listed Below (If Applicable). ? ?Pacemaker Implantation, Adult ?Pacemaker implantation is a procedure to place a pacemaker inside the chest. A pacemaker is a small computer that sends electrical signals to the heart and helps the heart beat normally. A pacemaker  also stores information about heart rhythms. You may need pacemaker implantation if you have: ?A slow heartbeat (bradycardia). ?Loss of consciousness that happens repeatedly (syncope) or repeated episodes of dizziness or light-headedness because of an irregular heart rate. ?Shortness of breath (dyspnea) due to heart problems. ?The pacemaker usually attaches to your heart through a wire called a lead. One or two leads may be needed. There are different types of pacemakers: ?Transvenous pacemaker. This type is placed under the skin or muscle of your upper chest area. The lead goes through a vein in the chest area to reach the inside of the heart. ?Epicardial pacemaker. This type is placed under the skin or muscle of your chest or abdomen. The lead goes through your chest to the outside of the heart. ?Tell a health care provider about: ?Any allergies you have. ?All medicines you are taking, including vitamins, herbs, eye drops, creams, and over-the-counter medicines. ?Any problems you or family members have had with anesthetic medicines. ?Any blood or bone disorders you have. ?Any surgeries you have had. ?Any medical conditions you have. ?Whether you are pregnant or may be pregnant. ?What are the risks? ?Generally, this is a safe procedure. However, problems may occur, including: ?Infection. ?Bleeding. ?Failure of the pacemaker or the lead. ?Collapse of a lung or bleeding into a lung. ?Blood clot inside a blood vessel with a lead. ?Damage to the heart. ?Infection inside the heart (endocarditis). ?Allergic reactions to medicines. ?What happens before the procedure? ?Staying hydrated ?Follow instructions from your health care provider about hydration, which may include: ?Up to 2 hours before the procedure -  you may continue to drink clear liquids, such as water, clear fruit juice, black coffee, and plain tea. ? ?Eating and drinking restrictions ?Follow instructions from your health care provider about eating and  drinking, which may include: ?8 hours before the procedure - stop eating heavy meals or foods, such as meat, fried foods, or fatty foods. ?6 hours before the procedure - stop eating light meals or foods, such as toast or cereal. ?6 hours before the procedure - stop drinking milk or drinks that contain milk. ?2 hours before the procedure - stop drinking clear liquids. ?Medicines ?Ask your health care provider about: ?Changing or stopping your regular medicines. This is especially important if you are taking diabetes medicines or blood thinners. ?Taking medicines such as aspirin and ibuprofen. These medicines can thin your blood. Do not take these medicines unless your health care provider tells you to take them. ?Taking over-the-counter medicines, vitamins, herbs, and supplements. ?Tests ?You may have: ?A heart evaluation. This may include: ?An electrocardiogram (ECG). This involves placing patches on your skin to check your heart rhythm. ?A chest X-ray. ?An echocardiogram. This is a test that uses sound waves (ultrasound) to produce an image of the heart. ?A cardiac rhythm monitor. This is used to record your heart rhythm and any events for a longer period of time. ?Blood tests. ?Genetic testing. ?General instructions ?Do not use any products that contain nicotine or tobacco for at least 4 weeks before the procedure. These products include cigarettes, e-cigarettes, and chewing tobacco. If you need help quitting, ask your health care provider. ?Ask your health care provider: ?How your surgery site will be marked. ?What steps will be taken to help prevent infection. These steps may include: ?Removing hair at the surgery site. ?Washing skin with a germ-killing soap. ?Receiving antibiotic medicine. ?Plan to have someone take you home from the hospital or clinic. ?If you will be going home right after the procedure, plan to have someone with you for 24 hours. ?What happens during the procedure? ?An IV will be inserted  into one of your veins. ?You will be given one or more of the following: ?A medicine to help you relax (sedative). ?A medicine to numb the area (local anesthetic). ?A medicine to make you fall asleep (general anesthetic). ?The next steps vary depending on the type of pacemaker you will be getting. ?If you are getting a transvenous pacemaker: ?An incision will be made in your upper chest. ?A pocket will be made for the pacemaker. It may be placed under the skin or between layers of muscle. ?The lead will be inserted into a blood vessel that goes to the heart. ?While X-rays are taken by an imaging machine (fluoroscopy), the lead will be advanced through the vein to the inside of your heart. ?The other end of the lead will be tunneled under the skin and attached to the pacemaker. ?If you are getting an epicardial pacemaker: ?An incision will be made near your ribs or breastbone (sternum) for the lead. ?The lead will be attached to the outside of your heart. ?Another incision will be made in your chest or upper abdomen to create a pocket for the pacemaker. ?The free end of the lead will be tunneled under the skin and attached to the pacemaker. ?The transvenous or epicardial pacemaker will be tested. Imaging studies may be done to check the lead position. ?The incisions will be closed with stitches (sutures), adhesive strips, or skin glue. ?Bandages (dressings) will be placed over the incisions. ?  The procedure may vary among health care providers and hospitals. ?What happens after the procedure? ?Your blood pressure, heart rate, breathing rate, and blood oxygen level will be monitored until you leave the hospital or clinic. ?You may be given antibiotics. ?You will be given pain medicine. ?An ECG and chest X-rays will be done. ?You may need to wear a continuous type of ECG (Holter monitor) to check your heart rhythm. ?Your health care provider will program the pacemaker. ?If you were given a sedative during the procedure,  it can affect you for several hours. Do not drive or operate machinery until your health care provider says that it is safe. ?You will be given a pacemaker identification card. This card lists the implant date,

## 2022-03-13 ENCOUNTER — Telehealth: Payer: Self-pay | Admitting: Cardiology

## 2022-03-13 NOTE — Telephone Encounter (Signed)
Current HR 66. Patient said her heart rate was down to 39 earlier today. The patient has not missed any dose of medication. She had to take her PRN Cardizem last week for a few days with elevated heart rates. Patient states she takes her heart rate every day when she takes her medication. Today (5/12) around 9 am it was in the 60's then, 11:20 am in the 40's, 12 pm in the 30's. She felt dizzy and lightheaded having to lay down in the recliner. After ~ 20 minutes she got up and ate some candy and ~ 15 minutes later her heart rate started to come back up and she felt better. She ate breakfast this morning and some coffee. She does hydrate with 64 ounces of water a day. Doesn't have a history of blood sugar problems.  ?Heart rates via pulse ox. ?5/8 - 120's 7 am ?5/8 - 60's 11 am  ?5/9 - 60-70's ?5/10 - 60-70's  ?5/11 - 60-70's  ? BP 138/74 HR 65 via cuff. ? ?Gave ED precautions. Will forward to MD for advisement.  ?Verbalized understanding.  ?

## 2022-03-13 NOTE — Telephone Encounter (Signed)
STAT if HR is under 50 or over 120 ?(normal HR is 60-100 beats per minute) ? ?What is your heart rate? 59 ? ?Do you have a log of your heart rate readings (document readings)? 39 this morning ? ?Do you have any other symptoms? Patient felt dizzy and lightheaded this morning when  HR was low.  ? ?STAT if patient feels like he/she is going to faint  ? ?Are you dizzy now? no ? ?Do you feel faint or have you passed out? no ? ?Do you have any other symptoms? Patient felt weak ? ?Have you checked your HR and BP (record if available)? See above  ? ?Patient states she ate some candy and wanted to know if that ws the reason why her HR came back up. She also laid down in the recliner. She wanted to know what to do in case her HR were to go low again  ?

## 2022-03-18 NOTE — Telephone Encounter (Signed)
Follow Up: ? ? ? ? ? ?Son is returning Jenny's call from today ?

## 2022-03-18 NOTE — Telephone Encounter (Signed)
Left message to call back  

## 2022-03-18 NOTE — Telephone Encounter (Signed)
Called pt to inform of MD recommendation.  Pt reports is aware of recommendation. "No changes in recommendations. Pacemaker was discussed at last appointment for tachy-brady syndrome.  ?Thanks,  ?[name]" ?Pt will call back when has made decision if wants a PPM.  No further questions or concerns.  ?

## 2022-03-25 ENCOUNTER — Ambulatory Visit: Payer: Medicare Other | Admitting: Cardiology

## 2022-03-27 ENCOUNTER — Other Ambulatory Visit: Payer: Self-pay | Admitting: Gastroenterology

## 2022-03-27 DIAGNOSIS — G8929 Other chronic pain: Secondary | ICD-10-CM

## 2022-03-27 DIAGNOSIS — K219 Gastro-esophageal reflux disease without esophagitis: Secondary | ICD-10-CM

## 2022-04-01 ENCOUNTER — Telehealth: Payer: Self-pay | Admitting: Cardiology

## 2022-04-01 NOTE — Telephone Encounter (Addendum)
Talked to patient more about Tachycardia-bradycardia syndrome and the pacemakers role with this diagnosis.  Verbalized understanding and will call office if she wishes to proceed.

## 2022-04-01 NOTE — Telephone Encounter (Signed)
Patient had some follow up questions for Carla Flowers about the Pacemaker.

## 2022-04-10 ENCOUNTER — Telehealth: Payer: Self-pay | Admitting: Cardiology

## 2022-04-10 ENCOUNTER — Other Ambulatory Visit: Payer: Self-pay | Admitting: Internal Medicine

## 2022-04-10 DIAGNOSIS — I480 Paroxysmal atrial fibrillation: Secondary | ICD-10-CM

## 2022-04-10 NOTE — Telephone Encounter (Signed)
STAT if HR is under 50 or over 120 (normal HR is 60-100 beats per minute)  What is your heart rate? 114 right now last few days its been running 120 to 114  Do you have a log of your heart rate readings (document readings)?   Do you have any other symptoms?  quivering in  her stomach this morning- patient wants to be seen today

## 2022-04-10 NOTE — Telephone Encounter (Signed)
Prescription refill request for Eliquis received. Indication: Aflutter/Afib     Last office visit: 03/11/22 Quentin Ore)  Scr: 1.09 (09/17/21 via [facility] at [place])  Age: 88 Weight: 70.5kg  Appropriate dose and refill sent to requested pharmacy.

## 2022-04-13 NOTE — Telephone Encounter (Signed)
Heart rate is running 112-114 bpm today. She stated she has been having problems with heart rate since when she called 5/12. As previously from Dr. Quentin Ore "No changes in recommendations. Pacemaker was discussed at last appointment for tachy-brady syndrome.  Thanks,  [name] "  Patient stated she is worried that something has changed with he heart and wants to be seen. She wants to make sure nothing else is going on. States she is taking "a little extra medication, I only take 1/2 of the Diltiazem 60 mg prn and it helps for a few hours and comes back up, but if I take the whole extra pill it goes two low."   Also stated she is trying to avoid the pacemaker until the end of the summer but is not sure she is going to make it that long.   Appt made for 6/14 at 10:40 am. Patient verbalized understanding and agreement.

## 2022-04-15 ENCOUNTER — Encounter: Payer: Self-pay | Admitting: Cardiology

## 2022-04-15 ENCOUNTER — Ambulatory Visit: Payer: Medicare Other | Admitting: Cardiology

## 2022-04-15 VITALS — BP 132/88 | HR 116 | Ht 65.0 in | Wt 156.4 lb

## 2022-04-15 DIAGNOSIS — I495 Sick sinus syndrome: Secondary | ICD-10-CM

## 2022-04-15 DIAGNOSIS — R42 Dizziness and giddiness: Secondary | ICD-10-CM | POA: Diagnosis not present

## 2022-04-15 DIAGNOSIS — I4819 Other persistent atrial fibrillation: Secondary | ICD-10-CM

## 2022-04-15 DIAGNOSIS — I484 Atypical atrial flutter: Secondary | ICD-10-CM | POA: Diagnosis not present

## 2022-04-15 NOTE — Progress Notes (Signed)
Electrophysiology Office Follow up Visit Note:    Date:  04/15/2022   ID:  Carla Flowers, DOB 04/18/1932, MRN [id number]  PCP:  [name], MD  [facility] Cardiologist:  None  [facility] Electrophysiologist:  Vickie Epley, MD    Interval History:    Carla Flowers is a 88 y.o. female who presents for a follow up visit.  I saw the patient Mar 11, 2022 for tachycardia-bradycardia syndrome.  At that appointment we discussed permanent pacing to help facilitate antiarrhythmic drug use or AVJ ablation.  We adjusted some of her nodal blockers at that visit and added in as needed diltiazem for sustained tachycardia.  The patient is called back multiple times since then continuing to have episodes of symptomatic atrial fibrillation with rapid ventricular rates.   Today, the patient tells me she knows that something needs to be done about her atrial fibrillation given the persistently elevated rates and associated symptoms.  She is still hesitant to proceed with any treatment for the atrial fibrillation given an upcoming trip to the mountains.     Past Medical History:  Diagnosis Date   A-fib ([place])    Asymmetric septal hypertrophy (HCC)    Basal cell carcinoma    nose   GERD (gastroesophageal reflux disease)    H/O: rheumatic fever    Hyperlipidemia    Mitral valve prolapse    Seizure (HCC)    SVT (supraventricular tachycardia) ([place])    documented short RP tachycardia, previously adenosine sensitive    Past Surgical History:  Procedure Laterality Date   ABDOMINAL HYSTERECTOMY     BASAL CELL CARCINOMA EXCISION     nose    Current Medications: Current Meds  Medication Sig   acetaminophen (TYLENOL) 500 MG tablet Take 500-1,000 mg by mouth every 6 (six) hours as needed (pain.).   Coenzyme Q10 (COQ-10) 100 MG CAPS Take 1 capsule by mouth daily.   diltiazem (CARDIZEM CD) 180 MG 24 hr capsule Take 180 mg by mouth daily.   diltiazem (CARDIZEM) 60 MG tablet TAKE 1-2  TABLETS BY MOUTH DAILY AS NEEDED FOR PALPITATIONS   ELIQUIS 5 MG TABS tablet TAKE 1 TABLET(5 MG) BY MOUTH TWICE DAILY   famotidine (PEPCID) 20 MG tablet TAKE 1 TABLET(20 MG) BY MOUTH TWICE DAILY   levETIRAcetam (KEPPRA) 500 MG tablet Take 500 mg by mouth Twice daily.   pantoprazole (PROTONIX) 40 MG tablet TAKE 1 TABLET(40 MG) BY MOUTH TWICE DAILY   vitamin B-12 (CYANOCOBALAMIN) 1000 MCG tablet Take 1,000 mcg by mouth daily.   Vitamin D, Ergocalciferol, (DRISDOL) 1.25 MG (50000 UNIT) CAPS capsule Take 50,000 Units by mouth once a week.     Allergies:   Sulfa antibiotics, Amoxicillin, Codeine, Cortisone, Macrobid [nitrofurantoin], and Sulfonamide derivatives   Social History   Socioeconomic History   Marital status: Married    Spouse name: Not on file   Number of children: 4   Years of education: Not on file   Highest education level: Not on file  Occupational History   Occupation: retired  Tobacco Use   Smoking status: Never   Smokeless tobacco: Never   Tobacco comments:    Never smoke 03/03/22  Vaping Use   Vaping Use: Never used  Substance and Sexual Activity   Alcohol use: No   Drug use: No   Sexual activity: Not on file  Other Topics Concern   Not on file  Social History Narrative   Not on file   Social  Determinants of Health   Financial Resource Strain: Not on file  Food Insecurity: Not on file  Transportation Needs: Not on file  Physical Activity: Not on file  Stress: Not on file  Social Connections: Not on file     Family History: The patient's family history includes Atrial fibrillation in her sister; Heart attack in her mother; Prostate cancer in her father; Stroke in her mother.  ROS:   Please see the history of present illness.    All other systems reviewed and are negative.  EKGs/Labs/Other Studies Reviewed:    The following studies were reviewed today:   Recent Labs: No results found for requested labs within last 365 days.  Recent Lipid  Panel No results found for: "CHOL", "TRIG", "HDL", "CHOLHDL", "VLDL", "LDLCALC", "LDLDIRECT"  Physical Exam:    VS:  BP 132/88   Pulse (!) 116   Ht '5\' 5"'$  (1.651 m)   Wt 156 lb 6.4 oz (70.9 kg)   SpO2 94%   BMI 26.03 kg/m     Wt Readings from Last 3 Encounters:  04/15/22 156 lb 6.4 oz (70.9 kg)  03/11/22 155 lb 6.4 oz (70.5 kg)  03/03/22 153 lb (69.4 kg)     GEN:  Well nourished, well developed in no acute distress.  Elderly HEENT: Normal NECK: No JVD; No carotid bruits LYMPHATICS: No lymphadenopathy CARDIAC: Tachycardic, irregularly irregular, no murmurs, rubs, gallops RESPIRATORY:  Clear to auscultation without rales, wheezing or rhonchi  ABDOMEN: Soft, non-tender, non-distended MUSCULOSKELETAL:  No edema; No deformity  SKIN: Warm and dry NEUROLOGIC:  Alert and oriented x 3 PSYCHIATRIC:  Normal affect        ASSESSMENT:    1. Atypical atrial flutter ([place])   2. Tachy-brady syndrome (HCC)   3. Persistent atrial fibrillation ([place])   4. Dizzy    PLAN:    In order of problems listed above:  #Persistent atrial fibrillation #Tachycardia-bradycardia syndrome Symptomatic.  Rates are difficult to control because of bradycardia while in sinus rhythm.  I discussed treatment options with the patient including antiarrhythmic drugs, rate control and permanent pacemaker implant.  She has been intolerant to medications in the past and antiarrhythmic drug therapy has caused significant symptomatic bradycardia.  I recommended permanent pacemaker implant.  The patient would like to think about it more before deciding on the timing.  She has an upcoming mountain trip and she does not know whether or not she wants to get it done before or after the mountain trip.  We will plan for a dual-chamber permanent pacemaker with a left bundle area lead.  Plan for an Abbott system.  I again reviewed the pacemaker implant procedure including the risks.  She would need to stop her Eliquis for 3  days prior to pacemaker implant.  Would plan to restart the Eliquis 5 days after implant.  Risks, benefits, alternatives to PPM implantation were discussed in detail with the patient today. The patient understands that the risks include but are not limited to bleeding, infection, pneumothorax, perforation, tamponade, vascular damage, renal failure, MI, stroke, death, and lead dislodgement and wishes to proceed.         Medication Adjustments/Labs and Tests Ordered: Current medicines are reviewed at length with the patient today.  Concerns regarding medicines are outlined above.  No orders of the defined types were placed in this encounter.  No orders of the defined types were placed in this encounter.    Signed, [name], MD, [facility], [facility] 04/15/2022 1:07 PM  Electrophysiology [facility]

## 2022-04-15 NOTE — Patient Instructions (Addendum)
Medications: Your physician recommends that you continue on your current medications as directed. Please refer to the Current Medication list given to you today. *If you need a refill on your cardiac medications before your next appointment, please call your pharmacy*  Lab Work: None. If you have labs (blood work) drawn today and your tests are completely normal, you will receive your results only by: [place] (if you have MyChart) OR A paper copy in the mail If you have any lab test that is abnormal or we need to change your treatment, we will call you to review the results.  Testing/Procedures: None.  Follow-Up: At [facility], you and your health needs are our priority.  As part of our continuing mission to provide you with exceptional heart care, we have created designated Provider Care Teams.  These Care Teams include your primary Cardiologist (physician) and Advanced Practice Providers (APPs -  Physician Assistants and Nurse Practitioners) who all work together to provide you with the care you need, when you need it.  Your physician wants you to follow-up in: 6 months with [name], MD or call if you wish to move forward with pacemaker implant.    You will receive a reminder letter in the mail two months in advance. If you don't receive a letter, please call our office to schedule the follow-up appointment.  We recommend signing up for the patient portal called "MyChart".  Sign up information is provided on this After Visit Summary.  MyChart is used to connect with patients for Virtual Visits (Telemedicine).  Patients are able to view lab/test results, encounter notes, upcoming appointments, etc.  Non-urgent messages can be sent to your provider as well.   To learn more about what you can do with MyChart, go to [email or web address].    Any Other Special Instructions Will Be Listed Below (If Applicable).

## 2022-04-30 DIAGNOSIS — I480 Paroxysmal atrial fibrillation: Secondary | ICD-10-CM | POA: Diagnosis not present

## 2022-04-30 DIAGNOSIS — R001 Bradycardia, unspecified: Secondary | ICD-10-CM | POA: Diagnosis not present

## 2022-04-30 DIAGNOSIS — I4892 Unspecified atrial flutter: Secondary | ICD-10-CM | POA: Diagnosis not present

## 2022-05-06 DIAGNOSIS — I471 Supraventricular tachycardia: Secondary | ICD-10-CM | POA: Diagnosis not present

## 2022-05-06 DIAGNOSIS — N1831 Chronic kidney disease, stage 3a: Secondary | ICD-10-CM | POA: Diagnosis not present

## 2022-05-06 DIAGNOSIS — I495 Sick sinus syndrome: Secondary | ICD-10-CM | POA: Diagnosis not present

## 2022-05-06 DIAGNOSIS — E7849 Other hyperlipidemia: Secondary | ICD-10-CM | POA: Diagnosis not present

## 2022-05-11 ENCOUNTER — Telehealth: Payer: Self-pay | Admitting: Cardiology

## 2022-05-11 DIAGNOSIS — R001 Bradycardia, unspecified: Secondary | ICD-10-CM

## 2022-05-11 DIAGNOSIS — Z01818 Encounter for other preprocedural examination: Secondary | ICD-10-CM

## 2022-05-11 NOTE — Telephone Encounter (Signed)
New message   Pt is calling to discuss scheduling ppm implant

## 2022-05-13 NOTE — Telephone Encounter (Signed)
Daughter-in-law called wanting to know when patient is going to receive call about scheduling her pace maker.  She is trying to plan her vacation and would like to know when it's going to be since they have to plan for someone to stay with her.

## 2022-05-13 NOTE — Telephone Encounter (Signed)
Called pt to let her know that Carla Flowers is out of the office but would be back on Thursday and will call her as soon as she can with a date.

## 2022-05-14 NOTE — Telephone Encounter (Signed)
Picked PPM implant date of Aug 14 and pre op lab date Aug 2. Will meet the [facility] for instructions and scrub.  Verbalized understanding and agreement.

## 2022-05-14 NOTE — Addendum Note (Signed)
Addended by: Darrell Jewel on: 05/14/2022 09:59 AM   Modules accepted: Orders

## 2022-05-15 NOTE — Telephone Encounter (Signed)
Pt is calling back requesting a call back in regards to upcoming appt for implant.

## 2022-05-15 NOTE — Telephone Encounter (Signed)
Answered all questions. Verbalized understanding.

## 2022-05-19 ENCOUNTER — Encounter: Payer: Self-pay | Admitting: *Deleted

## 2022-06-03 ENCOUNTER — Other Ambulatory Visit: Payer: Medicare Other

## 2022-06-03 DIAGNOSIS — R001 Bradycardia, unspecified: Secondary | ICD-10-CM

## 2022-06-03 DIAGNOSIS — Z01818 Encounter for other preprocedural examination: Secondary | ICD-10-CM

## 2022-06-03 LAB — CBC WITH DIFFERENTIAL/PLATELET
Basophils Absolute: 0 10*3/uL (ref 0.0–0.2)
Basos: 1 %
EOS (ABSOLUTE): 0.1 10*3/uL (ref 0.0–0.4)
Eos: 2 %
Hematocrit: 44.2 % (ref 34.0–46.6)
Hemoglobin: 14.7 g/dL (ref 11.1–15.9)
Lymphocytes Absolute: 2.2 10*3/uL (ref 0.7–3.1)
Lymphs: 36 %
MCH: 30.1 pg (ref 26.6–33.0)
MCHC: 33.3 g/dL (ref 31.5–35.7)
MCV: 90 fL (ref 79–97)
Monocytes Absolute: 0.4 10*3/uL (ref 0.1–0.9)
Monocytes: 7 %
Neutrophils Absolute: 3.5 10*3/uL (ref 1.4–7.0)
Neutrophils: 54 %
Platelets: 271 10*3/uL (ref 150–450)
RBC: 4.89 x10E6/uL (ref 3.77–5.28)
RDW: 14 % (ref 11.7–15.4)
WBC: 6.2 10*3/uL (ref 3.4–10.8)

## 2022-06-03 LAB — BASIC METABOLIC PANEL
BUN/Creatinine Ratio: 13 (ref 12–28)
BUN: 16 mg/dL (ref 8–27)
CO2: 28 mmol/L (ref 20–29)
Calcium: 9.6 mg/dL (ref 8.7–10.3)
Chloride: 105 mmol/L (ref 96–106)
Creatinine, Ser: 1.25 mg/dL — ABNORMAL HIGH (ref 0.57–1.00)
Glucose: 132 mg/dL — ABNORMAL HIGH (ref 70–99)
Potassium: 4.3 mmol/L (ref 3.5–5.2)
Sodium: 138 mmol/L (ref 134–144)
eGFR: 41 mL/min/{1.73_m2} — ABNORMAL LOW (ref >59)

## 2022-06-10 ENCOUNTER — Telehealth: Payer: Self-pay | Admitting: Cardiology

## 2022-06-10 NOTE — Telephone Encounter (Signed)
New Message:    Patient says she would like to talk to [facility] please. This is concerning her instructions forher procedure on 06-15-22.

## 2022-06-10 NOTE — Telephone Encounter (Signed)
Patient is concerned about not eating after midnight the night before her procedure.   Because the start time of her procedure is 3:30 pm. Advised patient she can stop eating and drinking after 7 am. Okay to take morning medications before 7 am except the Eliquis.  Patient verbalized understanding.

## 2022-06-15 ENCOUNTER — Other Ambulatory Visit: Payer: Self-pay

## 2022-06-15 ENCOUNTER — Ambulatory Visit (HOSPITAL_COMMUNITY)
Admission: RE | Disposition: A | Discharge status: Home / Self Care | Payer: Self-pay | Source: Ambulatory Visit | Attending: Cardiology

## 2022-06-15 ENCOUNTER — Ambulatory Visit (HOSPITAL_COMMUNITY)
Admission: RE | Admit: 2022-06-15 | Discharge: 2022-06-16 | Disposition: A | Discharge status: Home / Self Care | Payer: Medicare Other | Source: Ambulatory Visit | Attending: Cardiology | Admitting: Cardiology

## 2022-06-15 DIAGNOSIS — I471 Supraventricular tachycardia: Secondary | ICD-10-CM | POA: Insufficient documentation

## 2022-06-15 DIAGNOSIS — G40909 Epilepsy, unspecified, not intractable, without status epilepticus: Secondary | ICD-10-CM | POA: Insufficient documentation

## 2022-06-15 DIAGNOSIS — Z79899 Other long term (current) drug therapy: Secondary | ICD-10-CM | POA: Diagnosis not present

## 2022-06-15 DIAGNOSIS — I495 Sick sinus syndrome: Secondary | ICD-10-CM | POA: Insufficient documentation

## 2022-06-15 DIAGNOSIS — I4819 Other persistent atrial fibrillation: Secondary | ICD-10-CM | POA: Diagnosis not present

## 2022-06-15 DIAGNOSIS — R42 Dizziness and giddiness: Secondary | ICD-10-CM | POA: Insufficient documentation

## 2022-06-15 DIAGNOSIS — Z7901 Long term (current) use of anticoagulants: Secondary | ICD-10-CM | POA: Diagnosis not present

## 2022-06-15 DIAGNOSIS — I484 Atypical atrial flutter: Secondary | ICD-10-CM | POA: Insufficient documentation

## 2022-06-15 HISTORY — PX: PACEMAKER IMPLANT: EP1218

## 2022-06-15 SURGERY — PACEMAKER IMPLANT

## 2022-06-15 MED ORDER — CHLORHEXIDINE GLUCONATE 4 % EX LIQD
4.0000 | Freq: Once | CUTANEOUS | Status: DC
  Filled 2022-06-15: qty 60

## 2022-06-15 MED ORDER — SODIUM CHLORIDE 0.9 % IV SOLN
80.0000 mg | INTRAVENOUS | Status: AC
  Administered 2022-06-15: 80 mg

## 2022-06-15 MED ORDER — DILTIAZEM HCL ER COATED BEADS 120 MG PO CP24
120.0000 mg | ORAL_CAPSULE | Freq: Two times a day (BID) | ORAL | Status: DC
  Administered 2022-06-16: 120 mg via ORAL
  Filled 2022-06-15 (×2): qty 1

## 2022-06-15 MED ORDER — ONDANSETRON HCL 4 MG/2ML IJ SOLN: 4.0000 mg | Freq: Four times a day (QID) | INTRAMUSCULAR | Status: DC | PRN

## 2022-06-15 MED ORDER — SODIUM CHLORIDE 0.9 % IV SOLN
INTRAVENOUS | Status: AC
  Filled 2022-06-15: qty 2

## 2022-06-15 MED ORDER — LIDOCAINE HCL (PF) 1 % IJ SOLN
INTRAMUSCULAR | Status: DC | PRN
  Administered 2022-06-15: 50 mL

## 2022-06-15 MED ORDER — SODIUM CHLORIDE 0.9 % IV SOLN: INTRAVENOUS | Status: DC

## 2022-06-15 MED ORDER — FENTANYL CITRATE (PF) 100 MCG/2ML IJ SOLN
INTRAMUSCULAR | Status: AC
  Filled 2022-06-15: qty 2

## 2022-06-15 MED ORDER — VANCOMYCIN HCL IN DEXTROSE 1-5 GM/200ML-% IV SOLN
INTRAVENOUS | Status: AC
  Filled 2022-06-15: qty 200

## 2022-06-15 MED ORDER — POVIDONE-IODINE 10 % EX SWAB
2.0000 | Freq: Once | CUTANEOUS | Status: AC
  Administered 2022-06-15: 2 via TOPICAL

## 2022-06-15 MED ORDER — FENTANYL CITRATE (PF) 100 MCG/2ML IJ SOLN
INTRAMUSCULAR | Status: DC | PRN
  Administered 2022-06-15: 25 ug via INTRAVENOUS
  Administered 2022-06-15: 12.5 ug via INTRAVENOUS

## 2022-06-15 MED ORDER — VANCOMYCIN HCL IN DEXTROSE 1-5 GM/200ML-% IV SOLN
1000.0000 mg | INTRAVENOUS | Status: AC
  Administered 2022-06-15: 1000 mg via INTRAVENOUS

## 2022-06-15 MED ORDER — HEPARIN (PORCINE) IN NACL 1000-0.9 UT/500ML-% IV SOLN
INTRAVENOUS | Status: AC
  Filled 2022-06-15: qty 500

## 2022-06-15 MED ORDER — FAMOTIDINE 20 MG PO TABS
20.0000 mg | ORAL_TABLET | Freq: Two times a day (BID) | ORAL | Status: DC
  Administered 2022-06-15 – 2022-06-16 (×2): 20 mg via ORAL
  Filled 2022-06-15 (×3): qty 1

## 2022-06-15 MED ORDER — MIDAZOLAM HCL 5 MG/5ML IJ SOLN
INTRAMUSCULAR | Status: DC | PRN
  Administered 2022-06-15: .5 mg via INTRAVENOUS
  Administered 2022-06-15: 1 mg via INTRAVENOUS

## 2022-06-15 MED ORDER — LEVETIRACETAM 500 MG PO TABS
500.0000 mg | ORAL_TABLET | Freq: Two times a day (BID) | ORAL | Status: DC
  Administered 2022-06-15 – 2022-06-16 (×2): 500 mg via ORAL
  Filled 2022-06-15 (×2): qty 1

## 2022-06-15 MED ORDER — MIDAZOLAM HCL 5 MG/5ML IJ SOLN
INTRAMUSCULAR | Status: AC
  Filled 2022-06-15: qty 5

## 2022-06-15 MED ORDER — LIDOCAINE HCL (PF) 1 % IJ SOLN
INTRAMUSCULAR | Status: AC
  Filled 2022-06-15: qty 60

## 2022-06-15 MED ORDER — ACETAMINOPHEN 325 MG PO TABS
325.0000 mg | ORAL_TABLET | ORAL | Status: DC | PRN
  Administered 2022-06-15: 325 mg via ORAL
  Filled 2022-06-15: qty 2

## 2022-06-15 MED ORDER — PANTOPRAZOLE SODIUM 40 MG PO TBEC
40.0000 mg | DELAYED_RELEASE_TABLET | Freq: Two times a day (BID) | ORAL | Status: DC
  Administered 2022-06-15 – 2022-06-16 (×2): 40 mg via ORAL
  Filled 2022-06-15 (×2): qty 1

## 2022-06-15 SURGICAL SUPPLY — 15 items
CABLE SURGICAL S-101-97-12 (CABLE) ×2 IMPLANT
CATH CPS LOCATOR 3D MED (CATHETERS) ×1 IMPLANT
HELIX LOCKING TOOL (MISCELLANEOUS) ×2
KIT MICROPUNCTURE NIT STIFF (SHEATH) ×1 IMPLANT
LEAD TENDRIL MRI 52CM LPA1200M (Lead) ×1 IMPLANT
LEAD TENDRIL SDX 2088TC-58CM (Lead) ×1 IMPLANT
PACEMAKER ASSURITY DR-RF (Pacemaker) ×1 IMPLANT
PAD DEFIB RADIO PHYSIO CONN (PAD) ×2 IMPLANT
SHEATH 8FR PRELUDE SNAP 13 (SHEATH) ×1 IMPLANT
SHEATH 9FR PRELUDE SNAP 13 (SHEATH) ×1 IMPLANT
SHEATH PROBE COVER 6X72 (BAG) ×1 IMPLANT
SLITTER AGILIS HISPRO (INSTRUMENTS) ×1 IMPLANT
TOOL HELIX LOCKING (MISCELLANEOUS) IMPLANT
TRAY PACEMAKER INSERTION (PACKS) ×2 IMPLANT
WIRE HI TORQ VERSACORE-J 145CM (WIRE) ×1 IMPLANT

## 2022-06-15 NOTE — Discharge Instructions (Signed)
    Supplemental Discharge Instructions for  Pacemaker/Defibrillator Patients  Tomorrow, 06/16/22, send in a device transmission  Activity No heavy lifting or vigorous activity with your left/right arm for 6 to 8 weeks.  Do not raise your left/right arm above your head for one week.  Gradually raise your affected arm as drawn below.              06/20/22                   06/21/22                    06/22/22                  06/23/22  __  NO DRIVING for  1 week   ; you may begin driving on   2/87/68  .  WOUND CARE Keep the wound area clean and dry.  Do not get this area wet , no showers for one week; you may shower on  06/23/22  . Tomorrow, 06/16/22, remove the arm sling Tomorrow, 06/16/22 remove the LARGE outer plastic bandage.  Underneath the plastic bandage there are steri strips (paper tapes), DO NOT remove these. The tape/steri-strips on your wound will fall off; do not pull them off.  No bandage is needed on the site.  DO  NOT apply any creams, oils, or ointments to the wound area. If you notice any drainage or discharge from the wound, any swelling or bruising at the site, or you develop a fever > 101? F after you are discharged home, call the office at once.  Special Instructions You are still able to use cellular telephones; use the ear opposite the side where you have your pacemaker/defibrillator.  Avoid carrying your cellular phone near your device. When traveling through airports, show security personnel your identification card to avoid being screened in the metal detectors.  Ask the security personnel to use the hand wand. Avoid arc welding equipment, MRI testing (magnetic resonance imaging), TENS units (transcutaneous nerve stimulators).  Call the office for questions about other devices. Avoid electrical appliances that are in poor condition or are not properly grounded. Microwave ovens are safe to be near or to operate.

## 2022-06-15 NOTE — H&P (Signed)
Electrophysiology Office Follow up Visit Note:     Date:  06/15/2022    ID:  Carla Flowers, DOB [birth year], MRN [id number]   PCP:  [name], MD             [facility] Cardiologist:  None  [facility] Electrophysiologist:  Vickie Epley, MD      Interval History:     Carla Flowers is a 88 y.o. female who presents for a follow up visit.  I saw the patient Mar 11, 2022 for tachycardia-bradycardia syndrome.  At that appointment we discussed permanent pacing to help facilitate antiarrhythmic drug use or AVJ ablation.  We adjusted some of her nodal blockers at that visit and added in as needed diltiazem for sustained tachycardia.  The patient is called back multiple times since then continuing to have episodes of symptomatic atrial fibrillation with rapid ventricular rates.    Today, the patient tells me she knows that something needs to be done about her atrial fibrillation given the persistently elevated rates and associated symptoms.  She is still hesitant to proceed with any treatment for the atrial fibrillation given an upcoming trip to the mountains.   Presents for PPM implant today. Procedure reviewed.   Objective      Past Medical History:  Diagnosis Date   A-fib ([place])     Asymmetric septal hypertrophy (HCC)     Basal cell carcinoma      nose   GERD (gastroesophageal reflux disease)     H/O: rheumatic fever     Hyperlipidemia     Mitral valve prolapse     Seizure (HCC)     SVT (supraventricular tachycardia) ([place])      documented short RP tachycardia, previously adenosine sensitive           Past Surgical History:  Procedure Laterality Date   ABDOMINAL HYSTERECTOMY       BASAL CELL CARCINOMA EXCISION        nose      Current Medications: Active Medications      Current Meds  Medication Sig   acetaminophen (TYLENOL) 500 MG tablet Take 500-1,000 mg by mouth every 6 (six) hours as needed (pain.).   Coenzyme Q10 (COQ-10) 100 MG CAPS Take 1  capsule by mouth daily.   diltiazem (CARDIZEM CD) 180 MG 24 hr capsule Take 180 mg by mouth daily.   diltiazem (CARDIZEM) 60 MG tablet TAKE 1-2 TABLETS BY MOUTH DAILY AS NEEDED FOR PALPITATIONS   ELIQUIS 5 MG TABS tablet TAKE 1 TABLET(5 MG) BY MOUTH TWICE DAILY   famotidine (PEPCID) 20 MG tablet TAKE 1 TABLET(20 MG) BY MOUTH TWICE DAILY   levETIRAcetam (KEPPRA) 500 MG tablet Take 500 mg by mouth Twice daily.   pantoprazole (PROTONIX) 40 MG tablet TAKE 1 TABLET(40 MG) BY MOUTH TWICE DAILY   vitamin B-12 (CYANOCOBALAMIN) 1000 MCG tablet Take 1,000 mcg by mouth daily.   Vitamin D, Ergocalciferol, (DRISDOL) 1.25 MG (50000 UNIT) CAPS capsule Take 50,000 Units by mouth once a week.        Allergies:   Sulfa antibiotics, Amoxicillin, Codeine, Cortisone, Macrobid [nitrofurantoin], and Sulfonamide derivatives    Social History         Socioeconomic History   Marital status: Married      Spouse name: Not on file   Number of children: 4   Years of education: Not on file   Highest education level: Not on file  Occupational History   Occupation: retired  Tobacco Use   Smoking status: Never   Smokeless tobacco: Never   Tobacco comments:      Never smoke 03/03/22  Vaping Use   Vaping Use: Never used  Substance and Sexual Activity   Alcohol use: No   Drug use: No   Sexual activity: Not on file  Other Topics Concern   Not on file  Social History Narrative   Not on file    Social Determinants of Health    Financial Resource Strain: Not on file  Food Insecurity: Not on file  Transportation Needs: Not on file  Physical Activity: Not on file  Stress: Not on file  Social Connections: Not on file      Family History: The patient's family history includes Atrial fibrillation in her sister; Heart attack in her mother; Prostate cancer in her father; Stroke in her mother.   ROS:   Please see the history of present illness.    All other systems reviewed and are negative.    EKGs/Labs/Other Studies Reviewed:     The following studies were reviewed today:     Recent Labs: No results found for requested labs within last 365 days.  Recent Lipid Panel Labs (Brief)  No results found for: "CHOL", "TRIG", "HDL", "CHOLHDL", "VLDL", "LDLCALC", "LDLDIRECT"     Physical Exam:    VS:  BP 147/72   Pulse 83   Ht '5\' 5"'$  (1.651 m)   Wt 156 lb 6.4 oz (70.9 kg)   SpO2 94%   BMI 26.03 kg/m         Wt Readings from Last 3 Encounters:  04/15/22 156 lb 6.4 oz (70.9 kg)  03/11/22 155 lb 6.4 oz (70.5 kg)  03/03/22 153 lb (69.4 kg)      GEN:  Well nourished, well developed in no acute distress.  Elderly HEENT: Normal NECK: No JVD; No carotid bruits LYMPHATICS: No lymphadenopathy CARDIAC: Tachycardic, irregularly irregular, no murmurs, rubs, gallops RESPIRATORY:  Clear to auscultation without rales, wheezing or rhonchi  ABDOMEN: Soft, non-tender, non-distended MUSCULOSKELETAL:  No edema; No deformity  SKIN: Warm and dry NEUROLOGIC:  Alert and oriented x 3 PSYCHIATRIC:  Normal affect            Assessment ASSESSMENT:     1. Atypical atrial flutter ([place])   2. Tachy-brady syndrome (HCC)   3. Persistent atrial fibrillation ([place])   4. Dizzy     PLAN:     In order of problems listed above:   #Persistent atrial fibrillation #Tachycardia-bradycardia syndrome Symptomatic.  Rates are difficult to control because of bradycardia while in sinus rhythm.  I discussed treatment options with the patient including antiarrhythmic drugs, rate control and permanent pacemaker implant.  She has been intolerant to medications in the past and antiarrhythmic drug therapy has caused significant symptomatic bradycardia.   I recommended permanent pacemaker implant.  The patient would like to think about it more before deciding on the timing.  She has an upcoming mountain trip and she does not know whether or not she wants to get it done before or after the mountain trip.  We will  plan for a dual-chamber permanent pacemaker with a left bundle area lead.  Plan for an Abbott system.   I again reviewed the pacemaker implant procedure including the risks.  She would need to stop her Eliquis for 3 days prior to pacemaker implant.  Would plan to restart the Eliquis 5 days after implant.   Risks, benefits, alternatives to PPM implantation  were discussed in detail with the patient today. The patient understands that the risks include but are not limited to bleeding, infection, pneumothorax, perforation, tamponade, vascular damage, renal failure, MI, stroke, death, and lead dislodgement and wishes to proceed.      Today she presents for PPM implant. Procedure reviewed.          Signed, [name], MD, [facility], [facility] 06/15/2022    Electrophysiology [facility]

## 2022-06-16 ENCOUNTER — Encounter (HOSPITAL_COMMUNITY): Payer: Self-pay | Admitting: Cardiology

## 2022-06-16 ENCOUNTER — Ambulatory Visit (HOSPITAL_COMMUNITY): Payer: Medicare Other

## 2022-06-16 ENCOUNTER — Other Ambulatory Visit (HOSPITAL_COMMUNITY): Payer: Self-pay

## 2022-06-16 DIAGNOSIS — G40909 Epilepsy, unspecified, not intractable, without status epilepticus: Secondary | ICD-10-CM | POA: Diagnosis not present

## 2022-06-16 DIAGNOSIS — Z7901 Long term (current) use of anticoagulants: Secondary | ICD-10-CM | POA: Diagnosis not present

## 2022-06-16 DIAGNOSIS — I471 Supraventricular tachycardia: Secondary | ICD-10-CM | POA: Diagnosis not present

## 2022-06-16 DIAGNOSIS — I495 Sick sinus syndrome: Secondary | ICD-10-CM | POA: Diagnosis not present

## 2022-06-16 DIAGNOSIS — I4819 Other persistent atrial fibrillation: Secondary | ICD-10-CM | POA: Diagnosis not present

## 2022-06-16 DIAGNOSIS — Z950 Presence of cardiac pacemaker: Secondary | ICD-10-CM | POA: Diagnosis not present

## 2022-06-16 DIAGNOSIS — I484 Atypical atrial flutter: Secondary | ICD-10-CM | POA: Diagnosis not present

## 2022-06-16 DIAGNOSIS — Z79899 Other long term (current) drug therapy: Secondary | ICD-10-CM | POA: Diagnosis not present

## 2022-06-16 DIAGNOSIS — R42 Dizziness and giddiness: Secondary | ICD-10-CM | POA: Diagnosis not present

## 2022-06-16 MED ORDER — DILTIAZEM HCL ER COATED BEADS 120 MG PO CP24
120.0000 mg | ORAL_CAPSULE | Freq: Two times a day (BID) | ORAL | 5 refills | Status: DC
  Filled 2022-06-16: qty 60, 30d supply, fill #0

## 2022-06-16 MED FILL — Heparin Sod (Porcine)-NaCl IV Soln 1000 Unit/500ML-0.9%: INTRAVENOUS | Qty: 500 | Status: AC

## 2022-06-16 NOTE — Discharge Summary (Signed)
ELECTROPHYSIOLOGY PROCEDURE DISCHARGE SUMMARY    Patient ID: Carla Flowers,  MRN: [id number], DOB/AGE: 86/01/1932 86 y.o.  Admit date: 06/15/2022 Discharge date: 06/16/2022  Primary Care Physician: [name], MD  Electrophysiologist: Dr. Quentin Ore (formally Dr. [name])  Primary Discharge Diagnosis:  Tachy-brady S/p PPM  Secondary Discharge Diagnosis:  SVT (adenosine sensitive) Seizure d/o AFib CHA2DS2Vasc is 3, on Eliquis   AAFib hx Diagnosed 2019 Flecainide started Sept 2019 > PR prolongation required dose reduction > failed to maintain SR and stopped Oct 2019 Amiodarone started Oct 2019, patient stopped with concerns of potential side effects She had declined ablation over the years   Allergies  Allergen Reactions   Sulfa Antibiotics Other (See Comments)    'couldn't breathe good'   Amoxicillin Other (See Comments)    Tremors   Codeine Other (See Comments)    unknown   Cortisone Hives and Other (See Comments)    Childhood reaction Unknown    Macrobid [Nitrofurantoin] Other (See Comments)    'achy all over', chills    Sulfonamide Derivatives Other (See Comments)    unknown   Prednisone Rash     Procedures This Admission:  1.  Implantation of a Abbott dual chamber PPM on 06/15/22 by Dr Quentin Ore.   CONCLUSIONS:   1. 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 Dual chamber permanent pacemaker implant with a left bundle area lead  3. No early apparent complications.   4. Hold Apixaban for 5 days (restart 06/21/2022)  5. Increase diltiazem dose to BID.   CXR on 06/16/22 demonstrated no pneumothorax status post device implantation.   Brief HPI: Carla Flowers is a 88 y.o. female is followed out patient by EP, with development of tachy-brady syndrome recommended PPM implantation.  Past medical history includes above.   Risks, benefits, and alternatives to PPM implantation were reviewed with the patient who wished to  proceed.   Hospital Course:  The patient was admitted and underwent implantation of a PPM with details as noted in the procedure report.  She was monitored on telemetry overnight which demonstrated SR.  Left chest was without hematoma or ecchymosis.  The device was interrogated and found to be functioning normally.  CXR was obtained and demonstrated no pneumothorax status post device implantation.  Wound care, arm mobility, and restrictions were reviewed with the patient.  The patient feels well, denies any P/SOB, with minimal site discomfort.  She was examined by Dr. Quentin Ore and considered stable for discharge to home.   No Eliquis 5 days Increasing her dilt to '120mg'$  BID    Physical Exam: Vitals:   06/15/22 1831 06/15/22 2139 06/15/22 2253 06/16/22 0636  BP: 128/63 (!) 122/59 130/62 139/75  Pulse: 79 78 84 73  Resp:    18  Temp:  97.6 F (36.4 C)  98.1 F (36.7 C)  TempSrc:  Oral  Oral  SpO2: (!) 89% 91% 90% 93%  Weight:      Height:        GEN- The patient is well appearing, alert and oriented x 3 today.   HEENT: normocephalic, atraumatic; sclera clear, conjunctiva pink; hearing intact; oropharynx clear; neck supple, no JVP Lungs-  CTA b/l, normal work of breathing.  No wheezes, rales, rhonchi Heart- RRR, no murmurs, rubs or gallops, PMI not laterally displaced GI- soft, non-tender, non-distended Extremities- no clubbing, cyanosis, or edema MS- no significant deformity or atrophy Skin- warm and dry, no rash or lesion,  left chest without hematoma/ecchymosis  Psych- euthymic mood, full affect Neuro- no gross deficits   Labs:   Lab Results  Component Value Date   WBC 6.2 06/03/2022   HGB 14.7 06/03/2022   HCT 44.2 06/03/2022   MCV 90 06/03/2022   PLT 271 06/03/2022   No results for input(s): "NA", "K", "CL", "CO2", "BUN", "CREATININE", "CALCIUM", "PROT", "BILITOT", "ALKPHOS", "ALT", "AST", "GLUCOSE" in the last 168 hours.  Invalid input(s): "LABALBU"  Discharge  Medications:  Allergies as of 06/16/2022       Reactions   Sulfa Antibiotics Other (See Comments)   'couldn't breathe good'   Amoxicillin Other (See Comments)   Tremors   Codeine Other (See Comments)   unknown   Cortisone Hives, Other (See Comments)   Childhood reaction Unknown   Macrobid [nitrofurantoin] Other (See Comments)   'achy all over', chills   Sulfonamide Derivatives Other (See Comments)   unknown   Prednisone Rash        Medication List     TAKE these medications    acetaminophen 500 MG tablet Commonly known as: TYLENOL Take 500-1,000 mg by mouth every 6 (six) hours as needed (pain.).   CoQ-10 100 MG Caps Take 100 mg by mouth daily.   cyanocobalamin 1000 MCG tablet Commonly known as: VITAMIN B12 Take 1,000 mcg by mouth daily.   diltiazem 120 MG 24 hr capsule Commonly known as: CARDIZEM CD Take 1 capsule (120 mg total) by mouth 2 (two) times daily. What changed:  medication strength how much to take when to take this   diltiazem 60 MG tablet Commonly known as: CARDIZEM TAKE 1-2 TABLETS BY MOUTH DAILY AS NEEDED FOR PALPITATIONS   Eliquis 5 MG Tabs tablet Generic drug: apixaban TAKE 1 TABLET(5 MG) BY MOUTH TWICE DAILY Notes to patient: Do not resume until 06/21/22 morning   famotidine 20 MG tablet Commonly known as: PEPCID TAKE 1 TABLET(20 MG) BY MOUTH TWICE DAILY   levETIRAcetam 500 MG tablet Commonly known as: KEPPRA Take 500 mg by mouth Twice daily.   pantoprazole 40 MG tablet Commonly known as: PROTONIX TAKE 1 TABLET(40 MG) BY MOUTH TWICE DAILY   Vitamin D (Ergocalciferol) 1.25 MG (50000 UNIT) Caps capsule Commonly known as: DRISDOL Take 50,000 Units by mouth once a week.               Discharge Care Instructions  (From admission, onward)           Start     Ordered   06/16/22 0000  Discharge wound care:       Comments: As per AVS   06/16/22 0933            Disposition: Home Discharge Instructions     Diet -  low sodium heart healthy   Complete by: As directed    Discharge wound care:   Complete by: As directed    As per AVS   Increase activity slowly   Complete by: As directed         Duration of Discharge Encounter: Greater than 30 minutes including physician time.  [name], PA-C 06/16/2022 9:34 AM

## 2022-06-24 ENCOUNTER — Ambulatory Visit (INDEPENDENT_AMBULATORY_CARE_PROVIDER_SITE_OTHER): Payer: Medicare Other

## 2022-06-24 DIAGNOSIS — I480 Paroxysmal atrial fibrillation: Secondary | ICD-10-CM

## 2022-06-24 DIAGNOSIS — I471 Supraventricular tachycardia: Secondary | ICD-10-CM

## 2022-06-24 DIAGNOSIS — I495 Sick sinus syndrome: Secondary | ICD-10-CM | POA: Diagnosis not present

## 2022-06-24 DIAGNOSIS — N3001 Acute cystitis with hematuria: Secondary | ICD-10-CM | POA: Diagnosis not present

## 2022-06-24 DIAGNOSIS — R300 Dysuria: Secondary | ICD-10-CM | POA: Diagnosis not present

## 2022-06-24 NOTE — Patient Instructions (Addendum)
   After Your Pacemaker   Monitor your pacemaker site for redness, swelling, and drainage. Call the device clinic at [phone] if you experience these symptoms or fever/chills.  Your incision was closed with Steri-strips or staples:  You may shower 7 days after your procedure and wash your incision with soap and water. Avoid lotions, ointments, or perfumes over your incision until it is well-healed.  You may use a hot tub or a pool after your wound check appointment if the incision is completely closed.  Do not lift, push or pull greater than 10 pounds with the affected arm until 6 weeks after your procedure. There are no other restrictions in arm movement after your wound check appointment. UNTIL AFTER SEPTEMBER 25.  You may drive, unless driving has been restricted by your healthcare providers.  Your Pacemaker is MRI compatible.  Remote monitoring is used to monitor your pacemaker from home. This monitoring is scheduled every 91 days by our office. It allows [place] to keep an eye on the functioning of your device to ensure it is working properly. You will routinely see your Electrophysiologist annually (more often if necessary).

## 2022-06-25 DIAGNOSIS — N3001 Acute cystitis with hematuria: Secondary | ICD-10-CM | POA: Diagnosis not present

## 2022-06-25 LAB — CUP PACEART INCLINIC DEVICE CHECK
Battery Remaining Longevity: 139 mo
Battery Voltage: 3.13 V
Brady Statistic RA Percent Paced: 12 %
Brady Statistic RV Percent Paced: 2 %
Date Time Interrogation Session: 20230823141500
Implantable Lead Implant Date: 20230814
Implantable Lead Implant Date: 20230814
Implantable Lead Location: 753859
Implantable Lead Location: 753860
Implantable Pulse Generator Implant Date: 20230814
Lead Channel Impedance Value: 525 Ohm
Lead Channel Impedance Value: 575 Ohm
Lead Channel Pacing Threshold Amplitude: 0.5 V
Lead Channel Pacing Threshold Amplitude: 1 V
Lead Channel Pacing Threshold Amplitude: 1 V
Lead Channel Pacing Threshold Pulse Width: 0.5 ms
Lead Channel Pacing Threshold Pulse Width: 0.5 ms
Lead Channel Pacing Threshold Pulse Width: 0.5 ms
Lead Channel Sensing Intrinsic Amplitude: 12 mV
Lead Channel Sensing Intrinsic Amplitude: 3.1 mV
Lead Channel Setting Pacing Amplitude: 0.75 V
Lead Channel Setting Pacing Amplitude: 3.5 V
Lead Channel Setting Pacing Pulse Width: 0.5 ms
Lead Channel Setting Sensing Sensitivity: 2 mV
Pulse Gen Model: 2272
Pulse Gen Serial Number: 6843316

## 2022-06-25 NOTE — Progress Notes (Signed)
Pacemaker check in clinic. Normal device function. Thresholds, sensing, impedances consistent with previous measurements. Device programmed to maximize longevity. AT/AF Burden <.1%. 12 brief AMS, longest was 7 minutes of ATach/AFlutter. Patient is on [facility] therapy and V rates were controlled . Device programmed at appropriate safety margins. Histogram distribution appropriate for patient activity level. Device programmed to optimize intrinsic conduction. Estimated longevity 7.5-11  years. Patient enrolled in remote follow-up. Patient education completed.

## 2022-07-02 ENCOUNTER — Telehealth: Payer: Self-pay | Admitting: Cardiology

## 2022-07-02 NOTE — Telephone Encounter (Signed)
STAT if HR is under 50 or over 120 (normal HR is 60-100 beats per minute)  What is your heart rate? 60  Do you have a log of your heart rate readings (document readings)?  60 59-61 61 62 35 - 10 mins ago for 10-15 sec  Do you have any other symptoms? No.  Pt was worried that it dropped to 35 and is requesting call back.

## 2022-07-03 NOTE — Telephone Encounter (Signed)
Patient called to check on status of her call earlier today. This is her third attempt, she has not heard back from anyone.

## 2022-07-03 NOTE — Telephone Encounter (Signed)
Patient calling back to follow up.

## 2022-07-03 NOTE — Telephone Encounter (Signed)
Spoke w/ pt and advised her that I have forwarded her message to Dr. Brock Ra nurse.  She is appreciative and will await a call back.

## 2022-07-07 NOTE — Telephone Encounter (Signed)
Advised the patient that her PPM was working correctly and the lowest her HR can go is 60 per Device RN.  Patient verbalized understanding.

## 2022-07-08 ENCOUNTER — Other Ambulatory Visit: Payer: Self-pay | Admitting: Internal Medicine

## 2022-08-18 ENCOUNTER — Other Ambulatory Visit: Payer: Self-pay | Admitting: Internal Medicine

## 2022-08-19 ENCOUNTER — Telehealth: Payer: Self-pay | Admitting: Cardiology

## 2022-08-19 NOTE — Telephone Encounter (Signed)
This is a [place] pt 

## 2022-08-19 NOTE — Telephone Encounter (Signed)
New message    *STAT* If patient is at the pharmacy, call can be transferred to refill team.   1. Which medications need to be refilled? (please list name of each medication and dose if known) diltiazem 120 mg  2. Which pharmacy/location (including street and city if local pharmacy) is medication to be sent to? [facility] on [address] in [place]   3. Do they need a 30 day or 90 day supply? 30     Pt states that she has enough pills until Thursday and will be out then. She said she is also going to be going out of town and needs her medication.

## 2022-08-19 NOTE — Telephone Encounter (Signed)
This is a [place] pt 

## 2022-09-15 ENCOUNTER — Ambulatory Visit (INDEPENDENT_AMBULATORY_CARE_PROVIDER_SITE_OTHER): Payer: Medicare Other

## 2022-09-15 DIAGNOSIS — I495 Sick sinus syndrome: Secondary | ICD-10-CM

## 2022-09-15 LAB — CUP PACEART REMOTE DEVICE CHECK
Battery Remaining Longevity: 104 mo
Battery Remaining Percentage: 95.5 %
Battery Voltage: 3.02 V
Brady Statistic AP VP Percent: 8.5 %
Brady Statistic AP VS Percent: 17 %
Brady Statistic AS VP Percent: 1 %
Brady Statistic AS VS Percent: 74 %
Brady Statistic RA Percent Paced: 24 %
Brady Statistic RV Percent Paced: 9.4 %
Date Time Interrogation Session: 20231114030120
Implantable Lead Connection Status: 753985
Implantable Lead Connection Status: 753985
Implantable Lead Implant Date: 20230814
Implantable Lead Implant Date: 20230814
Implantable Lead Location: 753859
Implantable Lead Location: 753860
Implantable Pulse Generator Implant Date: 20230814
Lead Channel Impedance Value: 450 Ohm
Lead Channel Impedance Value: 530 Ohm
Lead Channel Pacing Threshold Amplitude: 0.625 V
Lead Channel Pacing Threshold Amplitude: 1 V
Lead Channel Pacing Threshold Pulse Width: 0.5 ms
Lead Channel Pacing Threshold Pulse Width: 0.5 ms
Lead Channel Sensing Intrinsic Amplitude: 12 mV
Lead Channel Sensing Intrinsic Amplitude: 2.1 mV
Lead Channel Setting Pacing Amplitude: 0.875
Lead Channel Setting Pacing Amplitude: 3.5 V
Lead Channel Setting Pacing Pulse Width: 0.5 ms
Lead Channel Setting Sensing Sensitivity: 2 mV
Pulse Gen Model: 2272
Pulse Gen Serial Number: 6843316

## 2022-09-17 ENCOUNTER — Other Ambulatory Visit: Payer: Self-pay

## 2022-09-17 ENCOUNTER — Emergency Department (HOSPITAL_COMMUNITY)
Admission: EM | Admit: 2022-09-17 | Discharge: 2022-09-17 | Disposition: A | Discharge status: Home / Self Care | Payer: Medicare Other | Attending: Emergency Medicine | Admitting: Emergency Medicine

## 2022-09-17 ENCOUNTER — Emergency Department (HOSPITAL_COMMUNITY): Payer: Medicare Other

## 2022-09-17 DIAGNOSIS — R002 Palpitations: Secondary | ICD-10-CM | POA: Diagnosis not present

## 2022-09-17 DIAGNOSIS — R000 Tachycardia, unspecified: Secondary | ICD-10-CM | POA: Diagnosis not present

## 2022-09-17 DIAGNOSIS — I959 Hypotension, unspecified: Secondary | ICD-10-CM | POA: Diagnosis not present

## 2022-09-17 DIAGNOSIS — I4891 Unspecified atrial fibrillation: Secondary | ICD-10-CM | POA: Insufficient documentation

## 2022-09-17 DIAGNOSIS — G4489 Other headache syndrome: Secondary | ICD-10-CM | POA: Diagnosis not present

## 2022-09-17 DIAGNOSIS — R079 Chest pain, unspecified: Secondary | ICD-10-CM | POA: Diagnosis not present

## 2022-09-17 DIAGNOSIS — I10 Essential (primary) hypertension: Secondary | ICD-10-CM | POA: Diagnosis not present

## 2022-09-17 DIAGNOSIS — R0602 Shortness of breath: Secondary | ICD-10-CM | POA: Diagnosis not present

## 2022-09-17 DIAGNOSIS — R009 Unspecified abnormalities of heart beat: Secondary | ICD-10-CM | POA: Diagnosis not present

## 2022-09-17 DIAGNOSIS — M25519 Pain in unspecified shoulder: Secondary | ICD-10-CM | POA: Insufficient documentation

## 2022-09-17 DIAGNOSIS — Z7901 Long term (current) use of anticoagulants: Secondary | ICD-10-CM | POA: Insufficient documentation

## 2022-09-17 DIAGNOSIS — I700 Atherosclerosis of aorta: Secondary | ICD-10-CM | POA: Diagnosis not present

## 2022-09-17 DIAGNOSIS — I447 Left bundle-branch block, unspecified: Secondary | ICD-10-CM | POA: Diagnosis not present

## 2022-09-17 LAB — COMPREHENSIVE METABOLIC PANEL
ALT: 11 U/L (ref 0–44)
AST: 15 U/L (ref 15–41)
Albumin: 3.6 g/dL (ref 3.5–5.0)
Alkaline Phosphatase: 52 U/L (ref 38–126)
Anion gap: 9 (ref 5–15)
BUN: 9 mg/dL (ref 8–23)
CO2: 24 mmol/L (ref 22–32)
Calcium: 9.1 mg/dL (ref 8.9–10.3)
Chloride: 108 mmol/L (ref 98–111)
Creatinine, Ser: 1.22 mg/dL — ABNORMAL HIGH (ref 0.44–1.00)
GFR, Estimated: 43 mL/min — ABNORMAL LOW (ref >60)
Glucose, Bld: 136 mg/dL — ABNORMAL HIGH (ref 70–99)
Potassium: 4.1 mmol/L (ref 3.5–5.1)
Sodium: 141 mmol/L (ref 135–145)
Total Bilirubin: 0.8 mg/dL (ref 0.3–1.2)
Total Protein: 6.7 g/dL (ref 6.5–8.1)

## 2022-09-17 LAB — CBC WITH DIFFERENTIAL/PLATELET
Abs Immature Granulocytes: 0.02 10*3/uL (ref 0.00–0.07)
Basophils Absolute: 0.1 10*3/uL (ref 0.0–0.1)
Basophils Relative: 1 %
Eosinophils Absolute: 0.1 10*3/uL (ref 0.0–0.5)
Eosinophils Relative: 1 %
HCT: 46.2 % — ABNORMAL HIGH (ref 36.0–46.0)
Hemoglobin: 15.5 g/dL — ABNORMAL HIGH (ref 12.0–15.0)
Immature Granulocytes: 0 %
Lymphocytes Relative: 27 %
Lymphs Abs: 1.9 10*3/uL (ref 0.7–4.0)
MCH: 31.1 pg (ref 26.0–34.0)
MCHC: 33.5 g/dL (ref 30.0–36.0)
MCV: 92.6 fL (ref 80.0–100.0)
Monocytes Absolute: 0.4 10*3/uL (ref 0.1–1.0)
Monocytes Relative: 6 %
Neutro Abs: 4.6 10*3/uL (ref 1.7–7.7)
Neutrophils Relative %: 65 %
Platelets: 275 10*3/uL (ref 150–400)
RBC: 4.99 MIL/uL (ref 3.87–5.11)
RDW: 13 % (ref 11.5–15.5)
WBC: 7 10*3/uL (ref 4.0–10.5)
nRBC: 0 % (ref 0.0–0.2)

## 2022-09-17 LAB — TROPONIN I (HIGH SENSITIVITY)
Troponin I (High Sensitivity): 14 ng/L (ref <18)
Troponin I (High Sensitivity): 29 ng/L — ABNORMAL HIGH (ref <18)

## 2022-09-17 LAB — BRAIN NATRIURETIC PEPTIDE: B Natriuretic Peptide: 115.9 pg/mL — ABNORMAL HIGH (ref 0.0–100.0)

## 2022-09-17 MED ORDER — DILTIAZEM HCL 60 MG PO TABS
120.0000 mg | ORAL_TABLET | Freq: Once | ORAL | Status: DC
  Filled 2022-09-17: qty 2

## 2022-09-17 MED ORDER — DILTIAZEM HCL 25 MG/5ML IV SOLN
10.0000 mg | Freq: Once | INTRAVENOUS | Status: AC
  Administered 2022-09-17: 10 mg via INTRAVENOUS
  Filled 2022-09-17 (×2): qty 5

## 2022-09-17 NOTE — ED Notes (Signed)
Pt reports taking half of a '60mg'$  cardizem. Dr. Oswald Hillock at bedside.

## 2022-09-17 NOTE — Discharge Instructions (Addendum)
You were seen today for palpitations. Labs reassuring. Symptoms managed with your home diltiazem and well managed at this time. Please return to your cardiologist to let them know about your episode today and give further recommendations for care.  It appears your home as needed diltiazem worked well.  If you have any chest pain, shortness of breath, nausea vomiting then you should return for further care and management. Thank for the opportunity to participate in her care, Tretha Sciara MD

## 2022-09-17 NOTE — ED Notes (Signed)
Pt HR improving. Dr. Oswald Hillock aware.

## 2022-09-17 NOTE — ED Triage Notes (Signed)
Pt. Stated, Im here cause of the fast heart rate. No other symptoms. I was sent here for Dr's office.

## 2022-09-17 NOTE — ED Triage Notes (Signed)
EMS stated, with dysuria and when put on the monitor she had a rate around 130. No cardiac hx.

## 2022-09-17 NOTE — ED Notes (Signed)
Pt HR noted to be 140's-150's. Dr. Alexandria Lodge man at bedside.

## 2022-09-17 NOTE — ED Provider Triage Note (Signed)
Emergency Medicine Provider Triage Evaluation Note  Carla Flowers , a 88 y.o. female  was evaluated in triage.  Pt complains of palpitations she denies fevers or chills, nausea vomiting, syncope shortness of breath.  She states that she was just going for regular checkup today when her doctor noticed her heart rate in the 140s.  She has a history of A-fib she takes Eliquis and diltiazem at home.  She has been compliant with her medications.  Review of Systems  Positive: Shoulder pain, palpitations Negative: N/V/D abdominal pain  Physical Exam  BP 134/80   Pulse (!) 144   Temp (!) 97.3 F (36.3 C) (Oral)   Resp 17   Ht 5' 5.5" (1.664 m)   Wt 71.2 kg   SpO2 96%   BMI 25.73 kg/m  Gen:   Awake, no distress   Resp:  Normal effort  MSK:   Moves extremities without difficulty  Other:  Irregular rate  Medical Decision Making  Medically screening exam initiated at 11:07 AM.  Appropriate orders placed.  Munirah E Reinecke was informed that the remainder of the evaluation will be completed by another provider, this initial triage assessment does not replace that evaluation, and the importance of remaining in the ED until their evaluation is complete.   Tretha Sciara, MD 09/17/22 1109

## 2022-09-17 NOTE — ED Provider Notes (Signed)
[facility] Provider Note   CSN: [id number] Arrival date & time: 09/17/22  1047     History Chief Complaint  Patient presents with   Tachycardia   Palpitations    HPI Carla Flowers is a 88 y.o. female presenting for palpitations she denies fevers or chills, nausea vomiting, syncope shortness of breath.  She states that she was just going for regular checkup today when her doctor noticed her heart rate in the 140s.  She has a history of A-fib she takes Eliquis and diltiazem at home.  She has been compliant with her medications.  She endorses some shoulder pain but denies any chest pain at this time.  She is otherwise ambulatory tolerating p.o. intake.  States that she was grossly symptomatic and did not realize her heart was going that fast.  Her last echocardiogram was in 2021 and demonstrated normal ejection fraction.  Patient's recorded medical, surgical, social, medication list and allergies were reviewed in the Snapshot window as part of the initial history.   Review of Systems   Review of Systems  Constitutional:  Negative for chills and fever.  HENT:  Negative for ear pain and sore throat.   Eyes:  Negative for pain and visual disturbance.  Respiratory:  Negative for cough and shortness of breath.   Cardiovascular:  Positive for palpitations. Negative for chest pain.  Gastrointestinal:  Negative for abdominal pain and vomiting.  Genitourinary:  Negative for dysuria and hematuria.  Musculoskeletal:  Negative for arthralgias and back pain.  Skin:  Negative for color change and rash.  Neurological:  Negative for seizures and syncope.  All other systems reviewed and are negative.   Physical Exam Updated Vital Signs BP 124/60 (BP Location: Right Arm)   Pulse 60   Temp 98.7 F (37.1 C) (Oral)   Resp 16   Ht 5' 5.5" (1.664 m)   Wt 71.2 kg   SpO2 99%   BMI 25.73 kg/m  Physical Exam Vitals and nursing note reviewed.   Constitutional:      General: She is not in acute distress.    Appearance: She is well-developed.  HENT:     Head: Normocephalic and atraumatic.  Eyes:     Conjunctiva/sclera: Conjunctivae normal.  Cardiovascular:     Rate and Rhythm: Normal rate and regular rhythm.     Heart sounds: No murmur heard. Pulmonary:     Effort: Pulmonary effort is normal. No respiratory distress.     Breath sounds: Normal breath sounds.  Abdominal:     Palpations: Abdomen is soft.     Tenderness: There is no abdominal tenderness.  Musculoskeletal:        General: No swelling.     Cervical back: Neck supple.  Skin:    General: Skin is warm and dry.     Capillary Refill: Capillary refill takes less than 2 seconds.  Neurological:     Mental Status: She is alert.  Psychiatric:        Mood and Affect: Mood normal.      ED Course/ Medical Decision Making/ A&P    Procedures Procedures   Medications Ordered in ED Medications  diltiazem (CARDIZEM) tablet 120 mg (120 mg Oral Not Given 09/17/22 1210)  diltiazem (CARDIZEM) injection 10 mg (10 mg Intravenous Given 09/17/22 1249)    Medical Decision Making:    Carla Flowers is a 88 y.o. female who presented to the ED today with palpitations detailed above.  Patient's presentation is complicated by their history of atrial fibrillation on RVR, multiple medications to control RVR in the outpatient setting, pacer placement in the outpatient setting.  Patient placed on continuous vitals and telemetry monitoring while in ED which was reviewed periodically.   Complete initial physical exam performed, notably the patient  was hemodynamically stable in no acute distress.  She was initially notably tachycardic when I evaluated her in triage.  Heart rate 140s.  I escalated to charge nursing to get patient brought back immediately..      Reviewed and confirmed nursing documentation for past medical history, family history, social history.    Initial  Assessment:   With the patient's presentation of palpitations, most likely diagnosis is for with RVR. Other diagnoses were considered including (but not limited to) SVT, ACS, pulmonary embolism, heart failure with exacerbation. These are considered less likely due to history of present illness and physical exam findings.   This is most consistent with an acute life/limb threatening illness complicated by underlying chronic conditions.  Initial Plan:  Discussed with patient.  She is adamant that she has been compliant with her Eliquis and states that she took her diltiazem in the lobby while waiting to be seen today.  She is already having some improvement of her heart rate, however further treatment would be warranted given her persistent tachycardia to the 120s.  She was treated with 10 mg of IV diltiazem and rapidly converted to normal sinus rhythm. Screening labs including CBC and Metabolic panel to evaluate for infectious or metabolic etiology of disease.  Urinalysis with reflex culture ordered to evaluate for UTI or relevant urologic/nephrologic pathology.  CXR to evaluate for structural/infectious intrathoracic pathology.  EKG/delta troponin/BNP to evaluate for cardiac pathology. Objective evaluation as below reviewed with plan for close reassessment  Initial Study Results:   Laboratory  All laboratory results reviewed without evidence of clinically relevant pathology.   Initial labs all within normal limits.  EKG Initial EKG notable for A-fib with rapid ventricular response  Radiology  All images reviewed independently. Agree with radiology report at this time.   DG Chest 1 View  Result Date: 09/17/2022 CLINICAL DATA:  Shortness of breath.  Tachycardia. EXAM: CHEST  1 VIEW COMPARISON:  Radiographs 06/16/2022 and 08/17/2020. Abdominal CT 11/20/2019. FINDINGS: 1119 hours. Left subclavian pacemaker leads are unchanged, overlapping the right atrium and right ventricle. The heart size and  mediastinal contours are stable. There is aortic atherosclerosis. There is stable mild blunting of the right costophrenic angle and prominent epicardial fat at the right cardiophrenic angle. The lungs appear clear. No pneumothorax or acute osseous abnormality. IMPRESSION: Stable chest. No evidence of acute cardiopulmonary process. Aortic atherosclerosis. Electronically Signed   By: Richardean Sale M.D.   On: 09/17/2022 11:31     Final Assessment and Plan:   I was called to the bedside to evaluate the patient as she is requesting discharge and has been asymptomatic for a while since her initial treatment with 10 mg of IV diltiazem which led to conversion to normal sinus rhythm.  Initial labs are all back but unfortunately, repeat troponin was delayed. Had a conversation with patient's son and patient at bedside.  Her initial labs are reassuring at this time but given her prolonged period of rapid ventricular response, an elevated troponin on the repeat might be expected.  I took time to describe what the troponin lab is testing for and family expressed understanding.  Given the protracted wait and extended period of  observation in the emergency department at this time with no symptoms, they felt preferred immediate discharge rather than waiting for results. Approximately 1 hour after discharge, I received a notification that the troponin resulted at 29 up from 14 initially.  I called the patient on her cell phone and she answered.  She stated that she still has no symptoms.  She did not want to return to the emergency department and expressed understanding of importance of coming back if any symptoms were to start or if she has any change in her presentation that would make her want to be evaluated again.  Her son could hear the call and stated they would follow-up with her cardiologist.   Disposition:  I have considered need for hospitalization, however, considering all of the above, I believe this patient  is stable for discharge at this time.  Patient/family educated about specific return precautions for given chief complaint and symptoms.  Patient/family educated about follow-up with PCP and cardiology.     Patient/family expressed understanding of return precautions and need for follow-up. Patient spoken to regarding all imaging and laboratory results and appropriate follow up for these results. All education provided in verbal form with additional information in written form. Time was allowed for answering of patient questions. Patient discharged.    Emergency Department Medication Summary:   Medications  diltiazem (CARDIZEM) tablet 120 mg (120 mg Oral Not Given 09/17/22 1210)  diltiazem (CARDIZEM) injection 10 mg (10 mg Intravenous Given 09/17/22 1249)         Clinical Impression:  1. Palpitations      Discharge   Final Clinical Impression(s) / ED Diagnoses Final diagnoses:  Palpitations    Rx / DC Orders ED Discharge Orders     None         Tretha Sciara, MD 09/17/22 1555

## 2022-09-23 ENCOUNTER — Encounter: Payer: Medicare Other | Admitting: Cardiology

## 2022-09-23 DIAGNOSIS — I480 Paroxysmal atrial fibrillation: Secondary | ICD-10-CM | POA: Diagnosis not present

## 2022-09-23 DIAGNOSIS — N1831 Chronic kidney disease, stage 3a: Secondary | ICD-10-CM | POA: Diagnosis not present

## 2022-09-23 DIAGNOSIS — I10 Essential (primary) hypertension: Secondary | ICD-10-CM | POA: Diagnosis not present

## 2022-09-23 DIAGNOSIS — M79601 Pain in right arm: Secondary | ICD-10-CM | POA: Diagnosis not present

## 2022-09-25 DIAGNOSIS — M79601 Pain in right arm: Secondary | ICD-10-CM | POA: Diagnosis not present

## 2022-10-04 ENCOUNTER — Other Ambulatory Visit: Payer: Self-pay | Admitting: Cardiology

## 2022-10-05 NOTE — Telephone Encounter (Signed)
New message    *STAT* If patient is at the pharmacy, call can be transferred to refill team.   1. Which medications need to be refilled? (please list name of each medication and dose if known) diltiazem 120 mg  2. Which pharmacy/location (including street and city if local pharmacy) is medication to be sent to?[facility] on [address] in [place]  3. Do they need a 30 day or 90 day supply? [place]

## 2022-10-06 ENCOUNTER — Telehealth: Payer: Self-pay | Admitting: Cardiology

## 2022-10-06 MED ORDER — DILTIAZEM HCL ER COATED BEADS 120 MG PO CP24: ORAL_CAPSULE | ORAL | 0 refills | Status: DC

## 2022-10-06 NOTE — Telephone Encounter (Signed)
  Pt c/o medication issue:  1. Name of Medication: diltiazem (CARDIZEM CD) 120 MG 24 hr capsule   2. How are you currently taking this medication (dosage and times per day)? Take 1 capsule (120 mg total) by mouth daily.   3. Are you having a reaction (difficulty breathing--STAT)? No   4. What is your medication issue? Per daughter, pt been taking 1 capsule twice a day after her PPM implanted. Pt need new prescription with the new script sent to her pharmacy   [facility] [place], [place] needs it to day, she only have 1 pill left and she is leaving out of town today

## 2022-10-06 NOTE — Telephone Encounter (Signed)
Reviewed the patient's chart. She is s/p PPM implant on 06/15/22 with Dr. Quentin Ore. Per Dr. Mardene Speak procedure note from that day: CONCLUSIONS:   1. 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 Dual chamber permanent pacemaker implant with a left bundle area lead  3. No early apparent complications.   4. Hold Apixaban for 5 days (restart 06/21/2022)  5. Increase diltiazem dose to BID.    The dose was not adjusted on the patient's medication list at that time.   I will correct this today & refill her medication.    RX changed and sent to the pharmacy.   Attempted to call the patient's daughter, Cherre Huger. No answer- I left a message advising Carla Flowers that the requested RX for the patient had been sent to the pharmacy and should be ready a little later today.

## 2022-10-14 NOTE — Progress Notes (Signed)
Remote pacemaker transmission.   

## 2022-10-20 ENCOUNTER — Other Ambulatory Visit: Payer: Self-pay | Admitting: Cardiology

## 2022-10-20 DIAGNOSIS — I480 Paroxysmal atrial fibrillation: Secondary | ICD-10-CM

## 2022-10-21 ENCOUNTER — Ambulatory Visit: Payer: Medicare Other | Attending: Cardiology | Admitting: Cardiology

## 2022-10-21 ENCOUNTER — Encounter: Payer: Self-pay | Admitting: Cardiology

## 2022-10-21 VITALS — BP 130/60 | HR 74 | Ht 65.5 in | Wt 157.4 lb

## 2022-10-21 DIAGNOSIS — I471 Supraventricular tachycardia, unspecified: Secondary | ICD-10-CM | POA: Diagnosis not present

## 2022-10-21 DIAGNOSIS — I480 Paroxysmal atrial fibrillation: Secondary | ICD-10-CM

## 2022-10-21 DIAGNOSIS — I495 Sick sinus syndrome: Secondary | ICD-10-CM | POA: Diagnosis not present

## 2022-10-21 DIAGNOSIS — Z950 Presence of cardiac pacemaker: Secondary | ICD-10-CM

## 2022-10-21 LAB — CUP PACEART INCLINIC DEVICE CHECK
Battery Remaining Longevity: 133 mo
Battery Voltage: 3.02 V
Brady Statistic RA Percent Paced: 25 %
Brady Statistic RV Percent Paced: 9.6 %
Date Time Interrogation Session: 20231220132619
Implantable Lead Connection Status: 753985
Implantable Lead Connection Status: 753985
Implantable Lead Implant Date: 20230814
Implantable Lead Implant Date: 20230814
Implantable Lead Location: 753859
Implantable Lead Location: 753860
Implantable Pulse Generator Implant Date: 20230814
Lead Channel Impedance Value: 487.5 Ohm
Lead Channel Impedance Value: 512.5 Ohm
Lead Channel Pacing Threshold Amplitude: 0.75 V
Lead Channel Pacing Threshold Amplitude: 0.75 V
Lead Channel Pacing Threshold Amplitude: 0.75 V
Lead Channel Pacing Threshold Amplitude: 0.75 V
Lead Channel Pacing Threshold Pulse Width: 0.5 ms
Lead Channel Pacing Threshold Pulse Width: 0.5 ms
Lead Channel Pacing Threshold Pulse Width: 0.5 ms
Lead Channel Pacing Threshold Pulse Width: 0.5 ms
Lead Channel Sensing Intrinsic Amplitude: 12 mV
Lead Channel Sensing Intrinsic Amplitude: 2.6 mV
Lead Channel Setting Pacing Amplitude: 1 V
Lead Channel Setting Pacing Amplitude: 2.5 V
Lead Channel Setting Pacing Pulse Width: 0.5 ms
Lead Channel Setting Sensing Sensitivity: 2 mV
Pulse Gen Model: 2272
Pulse Gen Serial Number: 6843316

## 2022-10-21 LAB — PACEMAKER DEVICE OBSERVATION

## 2022-10-21 NOTE — Telephone Encounter (Signed)
Eliquis '5mg'$  refill request received. Patient is 88 years old, weight-71.2kg, Crea-1.22 on 09/17/2022, Diagnosis-Aflutter/fib, and last seen by Dr. Quentin Ore on 04/15/2022. Dose is appropriate based on dosing criteria. Will send in refill to requested pharmacy.

## 2022-10-21 NOTE — Progress Notes (Signed)
Electrophysiology Office Follow up Visit Note:    Date:  10/21/2022   ID:  Carla Flowers, DOB 1931-12-10, MRN [id number]  PCP:  [name], MD  [facility] Cardiologist:  None  [facility] Electrophysiologist:  Vickie Epley, MD    Interval History:    Carla Flowers is a 88 y.o. female who presents for a follow up visit.  She had a pacemaker implanted June 15, 2022 for tachybradycardia syndrome.  Remote interrogations following implant have shown stable device function.  She does have intermittent SVT episodes and takes diltiazem to help control these.  She has a history of atrial fibrillation for which she takes Eliquis.  She was seen in the ER in November with tachycardia with ventricular rates in the 140s.  This converted with diltiazem IV.  Today she is with family in clinic.  She tells me that she is happy with the overall control of her SVT episodes.  She is able to take an as needed Cardizem which seems to control the episodes.     Past Medical History:  Diagnosis Date   A-fib ([place])    Asymmetric septal hypertrophy (HCC)    Basal cell carcinoma    nose   GERD (gastroesophageal reflux disease)    H/O: rheumatic fever    Hyperlipidemia    Mitral valve prolapse    Seizure (HCC)    SVT (supraventricular tachycardia)    documented short RP tachycardia, previously adenosine sensitive    Past Surgical History:  Procedure Laterality Date   ABDOMINAL HYSTERECTOMY     BASAL CELL CARCINOMA EXCISION     nose   PACEMAKER IMPLANT N/A 06/15/2022   Procedure: PACEMAKER IMPLANT;  Surgeon: Vickie Epley, MD;  Location: [facility];  Service: Cardiovascular;  Laterality: N/A;    Current Medications: Current Meds  Medication Sig   acetaminophen (TYLENOL) 500 MG tablet Take 500-1,000 mg by mouth every 6 (six) hours as needed (pain.).   Coenzyme Q10 (COQ-10) 100 MG CAPS Take 100 mg by mouth daily.   diltiazem (CARDIZEM CD) 120 MG 24 hr capsule  Take 1 capsule (120 mg) by mouth twice daily   diltiazem (CARDIZEM) 60 MG tablet TAKE 1-2 TABLETS BY MOUTH DAILY AS NEEDED FOR PALPITATIONS   ELIQUIS 5 MG TABS tablet TAKE 1 TABLET(5 MG) BY MOUTH TWICE DAILY   famotidine (PEPCID) 20 MG tablet TAKE 1 TABLET(20 MG) BY MOUTH TWICE DAILY   levETIRAcetam (KEPPRA) 500 MG tablet Take 500 mg by mouth Twice daily.   pantoprazole (PROTONIX) 40 MG tablet TAKE 1 TABLET(40 MG) BY MOUTH TWICE DAILY   vitamin B-12 (CYANOCOBALAMIN) 1000 MCG tablet Take 1,000 mcg by mouth daily.   Vitamin D, Ergocalciferol, (DRISDOL) 1.25 MG (50000 UNIT) CAPS capsule Take 50,000 Units by mouth once a week.     Allergies:   Sulfa antibiotics, Amoxicillin, Codeine, Cortisone, Macrobid [nitrofurantoin], Sulfonamide derivatives, and Prednisone   Social History   Socioeconomic History   Marital status: Married    Spouse name: Not on file   Number of children: 4   Years of education: Not on file   Highest education level: Not on file  Occupational History   Occupation: retired  Tobacco Use   Smoking status: Never   Smokeless tobacco: Never   Tobacco comments:    Never smoke 03/03/22  Vaping Use   Vaping Use: Never used  Substance and Sexual Activity   Alcohol use: No   Drug use: No  Sexual activity: Not on file  Other Topics Concern   Not on file  Social History Narrative   Not on file   Social Determinants of Health   Financial Resource Strain: Not on file  Food Insecurity: Not on file  Transportation Needs: Not on file  Physical Activity: Not on file  Stress: Not on file  Social Connections: Not on file     Family History: The patient's family history includes Atrial fibrillation in her sister; Heart attack in her mother; Prostate cancer in her father; Stroke in her mother.  ROS:   Please see the history of present illness.    All other systems reviewed and are negative.  EKGs/Labs/Other Studies Reviewed:    The following studies were reviewed  today:  October 21, 2022 in clinic device interrogation personally reviewed Battery longevity 11 years.  Lead parameters stable.  Longest tachycardia episode on November 29 lasting 13 hours.  Atrial paces 25%.  Ventricular paces about 10%.  2.1% A-fib burden on Eliquis.     Recent Labs: 09/17/2022: ALT 11; B Natriuretic Peptide 115.9; BUN 9; Creatinine, Ser 1.22; Hemoglobin 15.5; Platelets 275; Potassium 4.1; Sodium 141  Recent Lipid Panel No results found for: "CHOL", "TRIG", "HDL", "CHOLHDL", "VLDL", "LDLCALC", "LDLDIRECT"  Physical Exam:    VS:  BP 130/60 (BP Location: Left Arm, Patient Position: Sitting, Cuff Size: Normal)   Pulse 74   Ht 5' 5.5" (1.664 m)   Wt 157 lb 6 oz (71.4 kg)   SpO2 95%   BMI 25.79 kg/m     Wt Readings from Last 3 Encounters:  10/21/22 157 lb 6 oz (71.4 kg)  09/17/22 157 lb (71.2 kg)  06/15/22 153 lb 8 oz (69.6 kg)     GEN:  Well nourished, well developed in no acute distress HEENT: Normal NECK: No JVD; No carotid bruits LYMPHATICS: No lymphadenopathy CARDIAC: RRR, no murmurs, rubs, gallops.  Prepectoral pocket well-healed RESPIRATORY:  Clear to auscultation without rales, wheezing or rhonchi  ABDOMEN: Soft, non-tender, non-distended MUSCULOSKELETAL:  No edema; No deformity  SKIN: Warm and dry NEUROLOGIC:  Alert and oriented x 3 PSYCHIATRIC:  Normal affect        ASSESSMENT:    1. Tachy-brady syndrome ([place])   2. SVT (supraventricular tachycardia)   3. Paroxysmal atrial fibrillation (HCC)   4. Cardiac pacemaker in situ    PLAN:    In order of problems listed above:  #Tachybradycardia syndrome Continue diltiazem Discussed up titration of her Cardizem during today's clinic visit with the patient declines citing a concern that would make her feel worse.  She will continue with her current diltiazem regimen.  If she were to have an increased burden of SVT or A-fib in the future, consider increasing Dilt or transitioning to  amiodarone.  #SVT Dilt as above  #Paroxysmal atrial fibrillation continue Eliquis for stroke risk mitigation Low burden on her interrogations  #Pacemaker in situ Device functioning appropriately.  Continue remote monitoring.  Follow-up 1 year with APP.       Medication Adjustments/Labs and Tests Ordered: Current medicines are reviewed at length with the patient today.  Concerns regarding medicines are outlined above.  No orders of the defined types were placed in this encounter.  No orders of the defined types were placed in this encounter.    Signed, [name], MD, [facility], [facility] 10/21/2022 10:40 AM    Electrophysiology [facility]

## 2022-10-21 NOTE — Patient Instructions (Signed)
Medication Instructions:  - Your physician recommends that you continue on your current medications as directed. Please refer to the Current Medication list given to you today.  *If you need a refill on your cardiac medications before your next appointment, please call your pharmacy*   Lab Work: - none ordered  If you have labs (blood work) drawn today and your tests are completely normal, you will receive your results only by: [place] (if you have MyChart) OR A paper copy in the mail If you have any lab test that is abnormal or we need to change your treatment, we will call you to review the results.   Testing/Procedures: - none ordered   Follow-Up: At [facility], you and your health needs are our priority.  As part of our continuing mission to provide you with exceptional heart care, we have created designated Provider Care Teams.  These Care Teams include your primary Cardiologist (physician) and Advanced Practice Providers (APPs -  Physician Assistants and Nurse Practitioners) who all work together to provide you with the care you need, when you need it.  We recommend signing up for the patient portal called "MyChart".  Sign up information is provided on this After Visit Summary.  MyChart is used to connect with patients for Virtual Visits (Telemedicine).  Patients are able to view lab/test results, encounter notes, upcoming appointments, etc.  Non-urgent messages can be sent to your provider as well.   To learn more about what you can do with MyChart, go to [email or web address].    Your next appointment:   1 year(s)  The format for your next appointment:   In Person  Provider:   You may see [name], MD or one of the following Advanced Practice Providers on your designated Care Team:   Mamie Levers, NP  Other Instructions N/a  Important Information About Sugar

## 2022-10-28 ENCOUNTER — Other Ambulatory Visit: Payer: Self-pay | Admitting: Internal Medicine

## 2022-10-28 DIAGNOSIS — Z0000 Encounter for general adult medical examination without abnormal findings: Secondary | ICD-10-CM | POA: Diagnosis not present

## 2022-10-28 DIAGNOSIS — R300 Dysuria: Secondary | ICD-10-CM | POA: Diagnosis not present

## 2022-10-28 DIAGNOSIS — R5383 Other fatigue: Secondary | ICD-10-CM | POA: Diagnosis not present

## 2022-10-28 DIAGNOSIS — E559 Vitamin D deficiency, unspecified: Secondary | ICD-10-CM | POA: Diagnosis not present

## 2022-10-28 DIAGNOSIS — E538 Deficiency of other specified B group vitamins: Secondary | ICD-10-CM | POA: Diagnosis not present

## 2022-10-28 DIAGNOSIS — I480 Paroxysmal atrial fibrillation: Secondary | ICD-10-CM | POA: Diagnosis not present

## 2022-10-28 DIAGNOSIS — E7849 Other hyperlipidemia: Secondary | ICD-10-CM | POA: Diagnosis not present

## 2022-10-28 DIAGNOSIS — N1831 Chronic kidney disease, stage 3a: Secondary | ICD-10-CM | POA: Diagnosis not present

## 2022-10-28 DIAGNOSIS — R5381 Other malaise: Secondary | ICD-10-CM | POA: Diagnosis not present

## 2022-10-28 DIAGNOSIS — I10 Essential (primary) hypertension: Secondary | ICD-10-CM | POA: Diagnosis not present

## 2022-11-24 ENCOUNTER — Telehealth: Payer: Self-pay

## 2022-11-24 ENCOUNTER — Encounter (HOSPITAL_COMMUNITY): Payer: Self-pay | Admitting: Physician Assistant

## 2022-11-24 ENCOUNTER — Ambulatory Visit (HOSPITAL_COMMUNITY)
Admission: RE | Admit: 2022-11-24 | Discharge: 2022-11-24 | Disposition: A | Discharge status: Home / Self Care | Payer: Medicare Other | Source: Ambulatory Visit | Attending: Physician Assistant | Admitting: Physician Assistant

## 2022-11-24 VITALS — BP 124/90 | HR 104 | Ht 65.5 in | Wt 158.2 lb

## 2022-11-24 DIAGNOSIS — R9431 Abnormal electrocardiogram [ECG] [EKG]: Secondary | ICD-10-CM | POA: Diagnosis not present

## 2022-11-24 DIAGNOSIS — D6869 Other thrombophilia: Secondary | ICD-10-CM

## 2022-11-24 DIAGNOSIS — I484 Atypical atrial flutter: Secondary | ICD-10-CM | POA: Diagnosis not present

## 2022-11-24 DIAGNOSIS — I4819 Other persistent atrial fibrillation: Secondary | ICD-10-CM | POA: Diagnosis not present

## 2022-11-24 DIAGNOSIS — I00 Rheumatic fever without heart involvement: Secondary | ICD-10-CM | POA: Insufficient documentation

## 2022-11-24 DIAGNOSIS — Z79899 Other long term (current) drug therapy: Secondary | ICD-10-CM | POA: Diagnosis not present

## 2022-11-24 DIAGNOSIS — Z7901 Long term (current) use of anticoagulants: Secondary | ICD-10-CM | POA: Insufficient documentation

## 2022-11-24 DIAGNOSIS — I495 Sick sinus syndrome: Secondary | ICD-10-CM | POA: Diagnosis not present

## 2022-11-24 DIAGNOSIS — R002 Palpitations: Secondary | ICD-10-CM | POA: Insufficient documentation

## 2022-11-24 DIAGNOSIS — E785 Hyperlipidemia, unspecified: Secondary | ICD-10-CM | POA: Diagnosis not present

## 2022-11-24 LAB — TSH: TSH: 1.961 u[IU]/mL (ref 0.350–4.500)

## 2022-11-24 MED ORDER — DILTIAZEM HCL ER COATED BEADS 180 MG PO CP24: 180.0000 mg | ORAL_CAPSULE | Freq: Two times a day (BID) | ORAL | 3 refills | Status: DC

## 2022-11-24 NOTE — Telephone Encounter (Signed)
Per Dr. Henri Medal Pt follow up with afib clinic to evaluate.   Likely to transition to amiodarone.  Appt made with afib clinic today at 3:30 pm.  Directions given to Pt.

## 2022-11-24 NOTE — Telephone Encounter (Signed)
Outreach made to Pt.  She states she has not felt well lately.  States she "feels bad".  Has felt her heart racing at times and feels that she does "not have much strength".  She states she was sick after Christmas, and the sickness has dragged on.  States she still has nose dripping and coughs at night.  She states she has not missed any doses of her Eliquis.  Advised would send to Dr. Quentin Ore for review and advisement.

## 2022-11-24 NOTE — Progress Notes (Signed)
Primary Care Physician: [name], MD Primary Electrophysiologist: Dr Quentin Ore Referring Physician: [facility] triage/Dr Allred   Carla Flowers is a 89 y.o. female with a history of paroxysmal atrial fibrillation, HLD, SVT, rheumatic fever, MVP who presents for follow up in the [facility]. The patient was initially diagnosed with atrial fibrillation 12/09/17 after presenting to the ED with symptoms of heart racing. Patient is on Eliquis for a CHADS2VASC score of 3. She has been followed by Dr [name] for both SVT and afib. She has declined ablation for SVT in the past. She reports that overnight on 07/28/20 she had symptoms of heart racing. She has taken doses of her PRN diltiazem with only mild improvement in her heart rate. She did have several episodes of diarrhea prior to the onset of her palpitations. She was started on flecainide but could not titrate up due to baseline conduction disease and she continued to have atrial flutter on lower doses. She was started on amiodarone 08/12/20.  Patient was seen by Dr [name] on 08/12/20 and her flecainide was stopped and she was started on amiodarone. However, she did stop amiodarone 2/2 concern about side effects. She wore a Zio monitor 01/2022 which showed 2% afib burden. She was seen by Dr Quentin Ore who recommended a PPM for tachybradycardia syndrome. This was done on 06/15/22. She was seen at the ED 09/2022 with rapid afib and converted with IV diltiazem.  On follow up today, the device clinic received an alert for an ongoing atrial flutter episode starting 11/21/22. Patient reports that she has felt very fatigued since then. There were no specific triggers that she could identify.   Today, she denies symptoms of palpitations, chest pain, shortness of breath, orthopnea, PND, lower extremity edema, dizziness, presyncope, syncope, snoring, daytime somnolence, bleeding, or neurologic sequela. The patient is tolerating  medications without difficulties and is otherwise without complaint today.    Atrial Fibrillation Risk Factors:  she does not have symptoms or diagnosis of sleep apnea. she does have a history of rheumatic fever. she does not have a history of alcohol use.  she has a BMI of Body mass index is 25.93 kg/m.Marland Kitchen Filed Weights   11/24/22 1543  Weight: 71.8 kg    Family History  Problem Relation Age of Onset   Heart attack Mother    Stroke Mother    Prostate cancer Father    Atrial fibrillation Sister      Atrial Fibrillation Management history:  Previous antiarrhythmic drugs: flecainide, amiodarone  Previous cardioversions: none Previous ablations: none CHADS2VASC score: 3 Anticoagulation history: Eliquis   Past Medical History:  Diagnosis Date   A-fib ([place])    Asymmetric septal hypertrophy ([facility])    Basal cell carcinoma    nose   GERD (gastroesophageal reflux disease)    H/O: rheumatic fever    Hyperlipidemia    Mitral valve prolapse    Seizure ([facility])    SVT (supraventricular tachycardia)    documented short RP tachycardia, previously adenosine sensitive   Past Surgical History:  Procedure Laterality Date   ABDOMINAL HYSTERECTOMY     BASAL CELL CARCINOMA EXCISION     nose   PACEMAKER IMPLANT N/A 06/15/2022   Procedure: PACEMAKER IMPLANT;  Surgeon: Vickie Epley, MD;  Location: [facility];  Service: Cardiovascular;  Laterality: N/A;    Current Outpatient Medications  Medication Sig Dispense Refill   acetaminophen (TYLENOL) 500 MG tablet Take 500-1,000 mg by mouth every 6 (six)  hours as needed (pain.).     Coenzyme Q10 (COQ-10) 100 MG CAPS Take 100 mg by mouth daily.     diltiazem (CARDIZEM CD) 120 MG 24 hr capsule Take 1 capsule (120 mg) by mouth twice daily 180 capsule 0   diltiazem (CARDIZEM) 60 MG tablet TAKE 1 TO 2 TABLETS BY MOUTH DAILY AS NEEDED FOR PALPITATIONS 30 tablet 6   ELIQUIS 5 MG TABS tablet TAKE 1 TABLET(5 MG) BY MOUTH TWICE DAILY 60  tablet 5   famotidine (PEPCID) 20 MG tablet TAKE 1 TABLET(20 MG) BY MOUTH TWICE DAILY 60 tablet 11   levETIRAcetam (KEPPRA) 500 MG tablet Take 500 mg by mouth Twice daily.     pantoprazole (PROTONIX) 40 MG tablet TAKE 1 TABLET(40 MG) BY MOUTH TWICE DAILY 60 tablet 11   vitamin B-12 (CYANOCOBALAMIN) 1000 MCG tablet Take 1,000 mcg by mouth daily.     Vitamin D, Ergocalciferol, (DRISDOL) 1.25 MG (50000 UNIT) CAPS capsule Take 50,000 Units by mouth once a week.     No current facility-administered medications for this encounter.    Allergies  Allergen Reactions   Sulfa Antibiotics Other (See Comments)    'couldn't breathe good'   Amoxicillin Other (See Comments)    Tremors   Codeine Other (See Comments)    unknown   Cortisone Hives and Other (See Comments)    Childhood reaction Unknown    Macrobid [Nitrofurantoin] Other (See Comments)    'achy all over', chills    Sulfonamide Derivatives Other (See Comments)    unknown   Prednisone Rash    Social History   Socioeconomic History   Marital status: Married    Spouse name: Not on file   Number of children: 4   Years of education: Not on file   Highest education level: Not on file  Occupational History   Occupation: retired  Tobacco Use   Smoking status: Never   Smokeless tobacco: Never   Tobacco comments:    Never smoke 03/03/22  Vaping Use   Vaping Use: Never used  Substance and Sexual Activity   Alcohol use: No   Drug use: No   Sexual activity: Not on file  Other Topics Concern   Not on file  Social History Narrative   Not on file   Social Determinants of Health   Financial Resource Strain: Not on file  Food Insecurity: Not on file  Transportation Needs: Not on file  Physical Activity: Not on file  Stress: Not on file  Social Connections: Not on file  Intimate Partner Violence: Not on file     ROS- All systems are reviewed and negative except as per the HPI above.  Physical Exam: Vitals:   11/24/22  1543  BP: (!) 124/90  Pulse: (!) 104  Weight: 71.8 kg  Height: 5' 5.5" (1.664 m)     GEN- The patient is a well appearing elderly female, alert and oriented x 3 today.   HEENT-head normocephalic, atraumatic, sclera clear, conjunctiva pink, hearing intact, trachea midline. Lungs- Clear to ausculation bilaterally, normal work of breathing Heart- irregular rate and rhythm, no murmurs, rubs or gallops  GI- soft, NT, ND, + BS Extremities- no clubbing, cyanosis, or edema MS- no significant deformity or atrophy Skin- no rash or lesion Psych- euthymic mood, full affect Neuro- strength and sensation are intact   Wt Readings from Last 3 Encounters:  11/24/22 71.8 kg  10/21/22 71.4 kg  09/17/22 71.2 kg    EKG today demonstrates  Atypical  atrial flutter, LVH Vent. rate 104 BPM PR interval * ms QRS duration 124 ms QT/QTcB 356/468 ms  Echo 05/26/20 demonstrated   1. Left ventricular ejection fraction, by estimation, is 60 to 65%. The  left ventricle has normal function. The left ventricle has no regional  wall motion abnormalities. There is moderate left ventricular hypertrophy. Left ventricular diastolic parameters are consistent with Grade II diastolic dysfunction (pseudonormalization). Elevated left ventricular end-diastolic pressure.   2. Right ventricular systolic function is normal. The right ventricular  size is normal. There is normal pulmonary artery systolic pressure.   3. Left atrial size was moderately dilated.   4. The mitral valve is abnormal. Mild mitral valve regurgitation.   5. The aortic valve is tricuspid. Aortic valve regurgitation is trivial.  Mild aortic valve sclerosis is present, with no evidence of aortic valve  stenosis.   6. The inferior vena cava is normal in size with greater than 50%  respiratory variability, suggesting right atrial pressure of 3 mmHg.   Epic records are reviewed at length today  CHA2DS2-VASc Score = 3  The patient's score is based  upon: CHF History: 0 HTN History: 0 Diabetes History: 0 Stroke History: 0 Vascular Disease History: 0 Age Score: 2 Gender Score: 1       ASSESSMENT AND PLAN: 1. Persistent Atrial Fibrillation/SVT/atypical atrial flutter The patient's CHA2DS2-VASc score is 3, indicating a 3.2% annual risk of stroke. Patient in atypical atrial flutter since 11/21/22, device clinic also notes intermittent undersensing, burden may be falsely low.  We discussed rhythm control options today. I recommended starting amiodarone. She would like to try increasing diltiazem first to avoid off target effects. If she remains out of rhythm at follow up next week, then she is agreeable to start amiodarone.  Increased diltiazem to 180 mg BID. Would hold PRN dosing on this dose. Check TSH today, recent cmet and CXR reviewed.  Continue Eliquis 5 mg BID  2. Secondary Hypercoagulable State (ICD10:  D68.69) The patient is at significant risk for stroke/thromboembolism based upon her CHA2DS2-VASc Score of 3.  Continue Apixaban (Eliquis).   3. Tachybradycardia syndrome S/p PPM, followed by Dr Quentin Ore and the device clinic.   Follow up in the AF clinic next week.    [facility] [address] [place], [place] [address] [phone] 11/24/2022 3:50 PM

## 2022-11-24 NOTE — Patient Instructions (Signed)
Start Cardizem '180mg'$  twice a day

## 2022-11-24 NOTE — Telephone Encounter (Signed)
Alert received from [facility]:  [facility] alert for "exceeded AT/AF burden". Presenting rhythm AFL with Vp/Vs (controlled). Onset 11/10/22 (see ep #6), intermittent undersensing of AFL noted and therefore AF burden is most likely low. Lewis- Eliquis. Routing for persistent AFL

## 2022-12-02 ENCOUNTER — Ambulatory Visit (HOSPITAL_COMMUNITY)
Admission: RE | Admit: 2022-12-02 | Discharge: 2022-12-02 | Disposition: A | Discharge status: Home / Self Care | Payer: Medicare Other | Source: Ambulatory Visit | Attending: Physician Assistant | Admitting: Physician Assistant

## 2022-12-02 ENCOUNTER — Encounter (HOSPITAL_COMMUNITY): Payer: Self-pay | Admitting: Physician Assistant

## 2022-12-02 VITALS — BP 128/72 | HR 111 | Ht 65.5 in | Wt 160.0 lb

## 2022-12-02 DIAGNOSIS — I484 Atypical atrial flutter: Secondary | ICD-10-CM | POA: Diagnosis not present

## 2022-12-02 DIAGNOSIS — E785 Hyperlipidemia, unspecified: Secondary | ICD-10-CM | POA: Diagnosis not present

## 2022-12-02 DIAGNOSIS — D6869 Other thrombophilia: Secondary | ICD-10-CM

## 2022-12-02 DIAGNOSIS — Z950 Presence of cardiac pacemaker: Secondary | ICD-10-CM | POA: Diagnosis not present

## 2022-12-02 DIAGNOSIS — I471 Supraventricular tachycardia, unspecified: Secondary | ICD-10-CM | POA: Diagnosis not present

## 2022-12-02 DIAGNOSIS — I495 Sick sinus syndrome: Secondary | ICD-10-CM | POA: Insufficient documentation

## 2022-12-02 DIAGNOSIS — I4819 Other persistent atrial fibrillation: Secondary | ICD-10-CM | POA: Diagnosis not present

## 2022-12-02 DIAGNOSIS — Z7901 Long term (current) use of anticoagulants: Secondary | ICD-10-CM | POA: Diagnosis not present

## 2022-12-02 MED ORDER — AMIODARONE HCL 200 MG PO TABS: ORAL_TABLET | ORAL | 0 refills | Status: DC

## 2022-12-02 NOTE — Patient Instructions (Signed)
Start Amiodarone '200mg'$  twice a day WITH FOOD for the first month then will reduce to once a day

## 2022-12-02 NOTE — Progress Notes (Signed)
Primary Care Physician: [name], MD Primary Electrophysiologist: Dr Quentin Ore Referring Physician: [facility] triage/Dr Allred   Carla Flowers is a 89 y.o. female with a history of paroxysmal atrial fibrillation, HLD, SVT, rheumatic fever, MVP who presents for follow up in the [facility]. The patient was initially diagnosed with atrial fibrillation 12/09/17 after presenting to the ED with symptoms of heart racing. Patient is on Eliquis for a CHADS2VASC score of 3. She has been followed by Dr [name] for both SVT and afib. She has declined ablation for SVT in the past. She reports that overnight on 07/28/20 she had symptoms of heart racing. She has taken doses of her PRN diltiazem with only mild improvement in her heart rate. She did have several episodes of diarrhea prior to the onset of her palpitations. She was started on flecainide but could not titrate up due to baseline conduction disease and she continued to have atrial flutter on lower doses. She was started on amiodarone 08/12/20.  Patient was seen by Dr [name] on 08/12/20 and her flecainide was stopped and she was started on amiodarone. However, she did stop amiodarone 2/2 concern about side effects. She wore a Zio monitor 01/2022 which showed 2% afib burden. She was seen by Dr Quentin Ore who recommended a PPM for tachybradycardia syndrome. This was done on 06/15/22. She was seen at the ED 09/2022 with rapid afib and converted with IV diltiazem.  The device clinic received an alert for an ongoing atrial flutter episode starting 11/21/22. Patient reports that she has felt very fatigued since then. Her diltiazem was increased.   On follow up today, patient reports she is not much improved since her last visit. Her heart rates remain elevated and she is still fatigued. No bleeding issues on anticoagulation.   Today, she denies symptoms of palpitations, chest pain, shortness of breath, orthopnea, PND, lower  extremity edema, dizziness, presyncope, syncope, snoring, daytime somnolence, bleeding, or neurologic sequela. The patient is tolerating medications without difficulties and is otherwise without complaint today.    Atrial Fibrillation Risk Factors:  she does not have symptoms or diagnosis of sleep apnea. she does have a history of rheumatic fever. she does not have a history of alcohol use.  she has a BMI of Body mass index is 26.22 kg/m.Marland Kitchen Filed Weights   12/02/22 1527  Weight: 72.6 kg    Family History  Problem Relation Age of Onset   Heart attack Mother    Stroke Mother    Prostate cancer Father    Atrial fibrillation Sister      Atrial Fibrillation Management history:  Previous antiarrhythmic drugs: flecainide, amiodarone  Previous cardioversions: none Previous ablations: none CHADS2VASC score: 3 Anticoagulation history: Eliquis   Past Medical History:  Diagnosis Date   A-fib ([place])    Asymmetric septal hypertrophy (HCC)    Basal cell carcinoma    nose   GERD (gastroesophageal reflux disease)    H/O: rheumatic fever    Hyperlipidemia    Mitral valve prolapse    Seizure ([facility])    SVT (supraventricular tachycardia)    documented short RP tachycardia, previously adenosine sensitive   Past Surgical History:  Procedure Laterality Date   ABDOMINAL HYSTERECTOMY     BASAL CELL CARCINOMA EXCISION     nose   PACEMAKER IMPLANT N/A 06/15/2022   Procedure: PACEMAKER IMPLANT;  Surgeon: Vickie Epley, MD;  Location: [facility];  Service: Cardiovascular;  Laterality: N/A;  Current Outpatient Medications  Medication Sig Dispense Refill   acetaminophen (TYLENOL) 500 MG tablet Take 500-1,000 mg by mouth every 6 (six) hours as needed (pain.).     Coenzyme Q10 (COQ-10) 100 MG CAPS Take 100 mg by mouth daily.     diltiazem (CARDIZEM CD) 180 MG 24 hr capsule Take 1 capsule (180 mg total) by mouth 2 (two) times daily. 60 capsule 3   diltiazem (CARDIZEM) 60 MG  tablet TAKE 1 TO 2 TABLETS BY MOUTH DAILY AS NEEDED FOR PALPITATIONS 30 tablet 6   ELIQUIS 5 MG TABS tablet TAKE 1 TABLET(5 MG) BY MOUTH TWICE DAILY 60 tablet 5   famotidine (PEPCID) 20 MG tablet TAKE 1 TABLET(20 MG) BY MOUTH TWICE DAILY 60 tablet 11   levETIRAcetam (KEPPRA) 500 MG tablet Take 500 mg by mouth Twice daily.     pantoprazole (PROTONIX) 40 MG tablet TAKE 1 TABLET(40 MG) BY MOUTH TWICE DAILY 60 tablet 11   vitamin B-12 (CYANOCOBALAMIN) 1000 MCG tablet Take 1,000 mcg by mouth daily.     Vitamin D, Ergocalciferol, (DRISDOL) 1.25 MG (50000 UNIT) CAPS capsule Take 50,000 Units by mouth once a week.     No current facility-administered medications for this encounter.    Allergies  Allergen Reactions   Sulfa Antibiotics Other (See Comments)    'couldn't breathe good'   Amoxicillin Other (See Comments)    Tremors   Codeine Other (See Comments)    unknown   Cortisone Hives and Other (See Comments)    Childhood reaction Unknown    Macrobid [Nitrofurantoin] Other (See Comments)    'achy all over', chills    Sulfonamide Derivatives Other (See Comments)    unknown   Prednisone Rash    Social History   Socioeconomic History   Marital status: Married    Spouse name: Not on file   Number of children: 4   Years of education: Not on file   Highest education level: Not on file  Occupational History   Occupation: retired  Tobacco Use   Smoking status: Never   Smokeless tobacco: Never   Tobacco comments:    Never smoke 03/03/22  Vaping Use   Vaping Use: Never used  Substance and Sexual Activity   Alcohol use: No   Drug use: No   Sexual activity: Not on file  Other Topics Concern   Not on file  Social History Narrative   Not on file   Social Determinants of Health   Financial Resource Strain: Not on file  Food Insecurity: Not on file  Transportation Needs: Not on file  Physical Activity: Not on file  Stress: Not on file  Social Connections: Not on file   Intimate Partner Violence: Not on file     ROS- All systems are reviewed and negative except as per the HPI above.  Physical Exam: Vitals:   12/02/22 1527  BP: 128/72  Pulse: (!) 111  Weight: 72.6 kg  Height: 5' 5.5" (1.664 m)     GEN- The patient is a well appearing elderly female, alert and oriented x 3 today.   HEENT-head normocephalic, atraumatic, sclera clear, conjunctiva pink, hearing intact, trachea midline. Lungs- Clear to ausculation bilaterally, normal work of breathing Heart- irregular rate and rhythm, no murmurs, rubs or gallops  GI- soft, NT, ND, + BS Extremities- no clubbing, cyanosis, or edema MS- no significant deformity or atrophy Skin- no rash or lesion Psych- euthymic mood, full affect Neuro- strength and sensation are intact   Wt  Readings from Last 3 Encounters:  12/02/22 72.6 kg  11/24/22 71.8 kg  10/21/22 71.4 kg    EKG today demonstrates  Atypical atrial flutter with variable block Vent. rate 111 BPM PR interval * ms QRS duration 124 ms QT/QTcB 348/473 ms  Echo 05/26/20 demonstrated   1. Left ventricular ejection fraction, by estimation, is 60 to 65%. The  left ventricle has normal function. The left ventricle has no regional  wall motion abnormalities. There is moderate left ventricular hypertrophy. Left ventricular diastolic parameters are consistent with Grade II diastolic dysfunction (pseudonormalization). Elevated left ventricular end-diastolic pressure.   2. Right ventricular systolic function is normal. The right ventricular  size is normal. There is normal pulmonary artery systolic pressure.   3. Left atrial size was moderately dilated.   4. The mitral valve is abnormal. Mild mitral valve regurgitation.   5. The aortic valve is tricuspid. Aortic valve regurgitation is trivial.  Mild aortic valve sclerosis is present, with no evidence of aortic valve  stenosis.   6. The inferior vena cava is normal in size with greater than 50%   respiratory variability, suggesting right atrial pressure of 3 mmHg.   Epic records are reviewed at length today  CHA2DS2-VASc Score = 3  The patient's score is based upon: CHF History: 0 HTN History: 0 Diabetes History: 0 Stroke History: 0 Vascular Disease History: 0 Age Score: 2 Gender Score: 1       ASSESSMENT AND PLAN: 1. Persistent Atrial Fibrillation/SVT/atypical atrial flutter The patient's CHA2DS2-VASc score is 3, indicating a 3.2% annual risk of stroke. Patient in atypical atrial flutter since 11/21/22, device clinic also notes intermittent undersensing, burden may be falsely low.  We discussed rhythm control options again today. Will start amiodarone 200 mg BID x 4 weeks then decrease to once daily.  Continue diltiazem 180 mg BID. Would hold PRN dosing on this dose. Continue Eliquis 5 mg BID If she does not convert chemically, will consider DCCV.   2. Secondary Hypercoagulable State (ICD10:  D68.69) The patient is at significant risk for stroke/thromboembolism based upon her CHA2DS2-VASc Score of 3.  Continue Apixaban (Eliquis).   3. Tachybradycardia syndrome S/p PPM, followed by Dr Quentin Ore and the device clinic.   Follow up in the AF clinic in 2-3 weeks.    [facility] [address] [place], [place] [address] [phone] 12/02/2022 3:36 PM

## 2022-12-03 ENCOUNTER — Other Ambulatory Visit (HOSPITAL_COMMUNITY): Payer: Self-pay | Admitting: *Deleted

## 2022-12-04 DIAGNOSIS — L738 Other specified follicular disorders: Secondary | ICD-10-CM | POA: Diagnosis not present

## 2022-12-04 DIAGNOSIS — L820 Inflamed seborrheic keratosis: Secondary | ICD-10-CM | POA: Diagnosis not present

## 2022-12-09 ENCOUNTER — Telehealth (HOSPITAL_COMMUNITY): Payer: Self-pay | Admitting: *Deleted

## 2022-12-09 NOTE — Telephone Encounter (Signed)
Pt prefers holding off on starting amiodarone since she is back in rhythm. Appt moved out for 6 weeks for follow up. Pt in agreement.

## 2022-12-09 NOTE — Telephone Encounter (Signed)
Pt states since Sunday she feels she has been back in normal rhythm HR in the 60s. Pt states she did not start amiodarone. Will have pt send transmission to confirm rhythm. Once transmission seen will call pt with recommendations on next steps.

## 2022-12-09 NOTE — Telephone Encounter (Signed)
Spoke with patient, had her send a transmission of 12/09/22 she in NSR, she had about 10hrs of AF on 12/07/22

## 2022-12-15 ENCOUNTER — Ambulatory Visit: Payer: Medicare Other

## 2022-12-15 DIAGNOSIS — I495 Sick sinus syndrome: Secondary | ICD-10-CM

## 2022-12-15 LAB — CUP PACEART REMOTE DEVICE CHECK
Battery Remaining Longevity: 120 mo
Battery Remaining Percentage: 95.5 %
Battery Voltage: 3.02 V
Brady Statistic AP VP Percent: 4.8 %
Brady Statistic AP VS Percent: 12 %
Brady Statistic AS VP Percent: 3.3 %
Brady Statistic AS VS Percent: 79 %
Brady Statistic RA Percent Paced: 12 %
Brady Statistic RV Percent Paced: 17 %
Date Time Interrogation Session: 20240213034228
Implantable Lead Connection Status: 753985
Implantable Lead Connection Status: 753985
Implantable Lead Implant Date: 20230814
Implantable Lead Implant Date: 20230814
Implantable Lead Location: 753859
Implantable Lead Location: 753860
Implantable Pulse Generator Implant Date: 20230814
Lead Channel Impedance Value: 450 Ohm
Lead Channel Impedance Value: 490 Ohm
Lead Channel Pacing Threshold Amplitude: 0.625 V
Lead Channel Pacing Threshold Amplitude: 0.625 V
Lead Channel Pacing Threshold Pulse Width: 0.5 ms
Lead Channel Pacing Threshold Pulse Width: 0.5 ms
Lead Channel Sensing Intrinsic Amplitude: 12 mV
Lead Channel Sensing Intrinsic Amplitude: 2.2 mV
Lead Channel Setting Pacing Amplitude: 0.875
Lead Channel Setting Pacing Amplitude: 2.5 V
Lead Channel Setting Pacing Pulse Width: 0.5 ms
Lead Channel Setting Sensing Sensitivity: 2 mV
Pulse Gen Model: 2272
Pulse Gen Serial Number: 6843316

## 2022-12-16 ENCOUNTER — Encounter (HOSPITAL_COMMUNITY): Payer: Medicare Other | Admitting: Physician Assistant

## 2022-12-21 ENCOUNTER — Telehealth: Payer: Self-pay

## 2022-12-21 NOTE — Telephone Encounter (Signed)
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  Will flag for afib clinic and Dr. LQuentin Ore  2 episodes on 12/20/22 of mean Hrs 175-176. Longest 189m 42 secs.

## 2023-01-08 ENCOUNTER — Encounter: Payer: Self-pay | Admitting: Cardiology

## 2023-01-18 NOTE — Progress Notes (Signed)
Remote pacemaker transmission.   

## 2023-01-27 ENCOUNTER — Inpatient Hospital Stay (HOSPITAL_COMMUNITY): Admission: RE | Admit: 2023-01-27 | Payer: Medicare Other | Source: Ambulatory Visit | Admitting: Physician Assistant

## 2023-01-27 ENCOUNTER — Encounter (HOSPITAL_COMMUNITY): Payer: Self-pay

## 2023-02-10 ENCOUNTER — Encounter (HOSPITAL_COMMUNITY): Payer: Self-pay | Admitting: Physician Assistant

## 2023-02-10 ENCOUNTER — Ambulatory Visit (HOSPITAL_COMMUNITY)
Admission: RE | Admit: 2023-02-10 | Discharge: 2023-02-10 | Disposition: A | Discharge status: Home / Self Care | Payer: Medicare Other | Source: Ambulatory Visit | Attending: Physician Assistant | Admitting: Physician Assistant

## 2023-02-10 VITALS — BP 130/66 | HR 67 | Ht 65.5 in | Wt 161.2 lb

## 2023-02-10 DIAGNOSIS — I4819 Other persistent atrial fibrillation: Secondary | ICD-10-CM

## 2023-02-10 DIAGNOSIS — Z8249 Family history of ischemic heart disease and other diseases of the circulatory system: Secondary | ICD-10-CM | POA: Diagnosis not present

## 2023-02-10 DIAGNOSIS — E785 Hyperlipidemia, unspecified: Secondary | ICD-10-CM | POA: Diagnosis not present

## 2023-02-10 DIAGNOSIS — I440 Atrioventricular block, first degree: Secondary | ICD-10-CM | POA: Insufficient documentation

## 2023-02-10 DIAGNOSIS — I495 Sick sinus syndrome: Secondary | ICD-10-CM | POA: Insufficient documentation

## 2023-02-10 DIAGNOSIS — Z950 Presence of cardiac pacemaker: Secondary | ICD-10-CM | POA: Insufficient documentation

## 2023-02-10 DIAGNOSIS — D6869 Other thrombophilia: Secondary | ICD-10-CM | POA: Insufficient documentation

## 2023-02-10 DIAGNOSIS — Z7901 Long term (current) use of anticoagulants: Secondary | ICD-10-CM | POA: Insufficient documentation

## 2023-02-10 DIAGNOSIS — I484 Atypical atrial flutter: Secondary | ICD-10-CM | POA: Insufficient documentation

## 2023-02-10 DIAGNOSIS — I471 Supraventricular tachycardia, unspecified: Secondary | ICD-10-CM | POA: Insufficient documentation

## 2023-02-10 DIAGNOSIS — Z79899 Other long term (current) drug therapy: Secondary | ICD-10-CM | POA: Insufficient documentation

## 2023-02-10 NOTE — Progress Notes (Signed)
Primary Care Physician: Eartha Inch, MD Primary Electrophysiologist: Dr Lalla Brothers Referring Physician: [facility] triage/Dr Allred   Carla Flowers is a 89 y.o. female with a history of paroxysmal atrial fibrillation, HLD, SVT, rheumatic fever, MVP who presents for follow up in the [facility]. The patient was initially diagnosed with atrial fibrillation 12/09/17 after presenting to the ED with symptoms of heart racing. Patient is on Eliquis for a CHADS2VASC score of 3. She has been followed by Dr Johney Frame for both SVT and afib. She has declined ablation for SVT in the past. She reports that overnight on 07/28/20 she had symptoms of heart racing. She has taken doses of her PRN diltiazem with only mild improvement in her heart rate. She did have several episodes of diarrhea prior to the onset of her palpitations. She was started on flecainide but could not titrate up due to baseline conduction disease and she continued to have atrial flutter on lower doses. She was started on amiodarone 08/12/20.  Patient was seen by Dr Johney Frame on 08/12/20 and her flecainide was stopped and she was started on amiodarone. However, she did stop amiodarone 2/2 concern about side effects. She wore a Zio monitor 01/2022 which showed 2% afib burden. She was seen by Dr Lalla Brothers who recommended a PPM for tachybradycardia syndrome. This was done on 06/15/22. She was seen at the ED 09/2022 with rapid afib and converted with IV diltiazem.  The device clinic received an alert for an ongoing atrial flutter episode starting 11/21/22. Patient reports that she has felt very fatigued since then. Her diltiazem was increased.   Patient spontaneously converted to SR after her visit on 12/02/22 and never started amiodarone.   On follow up today, patient reports that overall she has felt well from a cardiac standpoint. Her last remote device transmission showed 28% burden. She does get fatigued and dizzy when out of  rhythm. No bleeding issues on anticoagulation.   Today, she denies symptoms of palpitations, chest pain, shortness of breath, orthopnea, PND, lower extremity edema, presyncope, syncope, snoring, daytime somnolence, bleeding, or neurologic sequela. The patient is tolerating medications without difficulties and is otherwise without complaint today.    Atrial Fibrillation Risk Factors:  she does not have symptoms or diagnosis of sleep apnea. she does have a history of rheumatic fever. she does not have a history of alcohol use.  she has a BMI of Body mass index is 26.42 kg/m.Marland Kitchen Filed Weights   02/10/23 0839  Weight: 73.1 kg    Family History  Problem Relation Age of Onset   Heart attack Mother    Stroke Mother    Prostate cancer Father    Atrial fibrillation Sister      Atrial Fibrillation Management history:  Previous antiarrhythmic drugs: flecainide, amiodarone  Previous cardioversions: none Previous ablations: none CHADS2VASC score: 3 Anticoagulation history: Eliquis   Past Medical History:  Diagnosis Date   A-fib    Asymmetric septal hypertrophy    Basal cell carcinoma    nose   GERD (gastroesophageal reflux disease)    H/O: rheumatic fever    Hyperlipidemia    Mitral valve prolapse    Seizure    SVT (supraventricular tachycardia)    documented short RP tachycardia, previously adenosine sensitive   Past Surgical History:  Procedure Laterality Date   ABDOMINAL HYSTERECTOMY     BASAL CELL CARCINOMA EXCISION     nose   PACEMAKER IMPLANT N/A 06/15/2022   Procedure: PACEMAKER IMPLANT;  Surgeon: Lanier Prude, MD;  Location: [facility] INVASIVE CV LAB;  Service: Cardiovascular;  Laterality: N/A;    Current Outpatient Medications  Medication Sig Dispense Refill   acetaminophen (TYLENOL) 500 MG tablet Take 500-1,000 mg by mouth every 6 (six) hours as needed (pain.).     Coenzyme Q10 (COQ-10) 100 MG CAPS Take 100 mg by mouth daily.     diltiazem (CARDIZEM CD) 180 MG  24 hr capsule Take 1 capsule (180 mg total) by mouth 2 (two) times daily. 60 capsule 3   diltiazem (CARDIZEM) 60 MG tablet TAKE 1 TO 2 TABLETS BY MOUTH DAILY AS NEEDED FOR PALPITATIONS 30 tablet 6   ELIQUIS 5 MG TABS tablet TAKE 1 TABLET(5 MG) BY MOUTH TWICE DAILY 60 tablet 5   famotidine (PEPCID) 20 MG tablet TAKE 1 TABLET(20 MG) BY MOUTH TWICE DAILY 60 tablet 11   levETIRAcetam (KEPPRA) 500 MG tablet Take 500 mg by mouth Twice daily.     pantoprazole (PROTONIX) 40 MG tablet TAKE 1 TABLET(40 MG) BY MOUTH TWICE DAILY 60 tablet 11   vitamin B-12 (CYANOCOBALAMIN) 1000 MCG tablet Take 1,000 mcg by mouth daily.     Vitamin D, Ergocalciferol, (DRISDOL) 1.25 MG (50000 UNIT) CAPS capsule Take 50,000 Units by mouth once a week.     No current facility-administered medications for this encounter.    Allergies  Allergen Reactions   Sulfa Antibiotics Other (See Comments)    'couldn't breathe good'   Amoxicillin Other (See Comments)    Tremors   Codeine Other (See Comments)    unknown   Cortisone Hives and Other (See Comments)    Childhood reaction Unknown    Macrobid [Nitrofurantoin] Other (See Comments)    'achy all over', chills    Sulfonamide Derivatives Other (See Comments)    unknown   Prednisone Rash    Social History   Socioeconomic History   Marital status: Married    Spouse name: Not on file   Number of children: 4   Years of education: Not on file   Highest education level: Not on file  Occupational History   Occupation: retired  Tobacco Use   Smoking status: Never   Smokeless tobacco: Never   Tobacco comments:    Never smoke 03/03/22  Vaping Use   Vaping Use: Never used  Substance and Sexual Activity   Alcohol use: No   Drug use: No   Sexual activity: Not on file  Other Topics Concern   Not on file  Social History Narrative   Not on file   Social Determinants of Health   Financial Resource Strain: Not on file  Food Insecurity: Not on file  Transportation  Needs: Not on file  Physical Activity: Not on file  Stress: Not on file  Social Connections: Not on file  Intimate Partner Violence: Not on file     ROS- All systems are reviewed and negative except as per the HPI above.  Physical Exam: Vitals:   02/10/23 0839  BP: 130/66  Pulse: 67  Weight: 73.1 kg  Height: 5' 5.5" (1.664 m)    GEN- The patient is a well appearing elderly female, alert and oriented x 3 today.   HEENT-head normocephalic, atraumatic, sclera clear, conjunctiva pink, hearing intact, trachea midline. Lungs- Clear to ausculation bilaterally, normal work of breathing Heart- Regular rate and rhythm, no murmurs, rubs or gallops  GI- soft, NT, ND, + BS Extremities- no clubbing, cyanosis, or edema MS- no significant deformity or atrophy Skin-  no rash or lesion Psych- euthymic mood, full affect Neuro- strength and sensation are intact   Wt Readings from Last 3 Encounters:  02/10/23 73.1 kg  12/02/22 72.6 kg  11/24/22 71.8 kg    EKG today demonstrates  SR, 1st degree AV block, LVH with repol abnormality  Vent. rate 67 BPM PR interval 210 ms QRS duration 112 ms QT/QTcB 424/448 ms  Echo 05/26/20 demonstrated   1. Left ventricular ejection fraction, by estimation, is 60 to 65%. The  left ventricle has normal function. The left ventricle has no regional  wall motion abnormalities. There is moderate left ventricular hypertrophy. Left ventricular diastolic parameters are consistent with Grade II diastolic dysfunction (pseudonormalization). Elevated left ventricular end-diastolic pressure.   2. Right ventricular systolic function is normal. The right ventricular  size is normal. There is normal pulmonary artery systolic pressure.   3. Left atrial size was moderately dilated.   4. The mitral valve is abnormal. Mild mitral valve regurgitation.   5. The aortic valve is tricuspid. Aortic valve regurgitation is trivial.  Mild aortic valve sclerosis is present, with no  evidence of aortic valve  stenosis.   6. The inferior vena cava is normal in size with greater than 50%  respiratory variability, suggesting right atrial pressure of 3 mmHg.   Epic records are reviewed at length today  CHA2DS2-VASc Score = 3  The patient's score is based upon: CHF History: 0 HTN History: 0 Diabetes History: 0 Stroke History: 0 Vascular Disease History: 0 Age Score: 2 Gender Score: 1       ASSESSMENT AND PLAN: 1. Persistent Atrial Fibrillation/SVT/atypical atrial flutter The patient's CHA2DS2-VASc score is 3, indicating a 3.2% annual risk of stroke. PPM shows 28% burden. Patient happy with her present treatment. She does not want to start AAD.   Continue diltiazem 180 mg BID. May take one 60 mg PRN for heart racing. Continue Eliquis 5 mg BID  2. Secondary Hypercoagulable State (ICD10:  D68.69) The patient is at significant risk for stroke/thromboembolism based upon her CHA2DS2-VASc Score of 3.  Continue Apixaban (Eliquis).   3. Tachybradycardia syndrome S/p PPM, followed by Dr Lalla BrothersLambert and the device clinic.   Follow up with Dr Lalla BrothersLambert per recall. [place] in [place] moving forward.    Jorja Loaicky Carnisha Feltz PA-C [facility] [address] [place], [place] [address] [phone] 02/10/2023 9:35 AM

## 2023-02-13 ENCOUNTER — Other Ambulatory Visit: Payer: Self-pay | Admitting: Gastroenterology

## 2023-02-13 DIAGNOSIS — G8929 Other chronic pain: Secondary | ICD-10-CM

## 2023-02-13 DIAGNOSIS — K219 Gastro-esophageal reflux disease without esophagitis: Secondary | ICD-10-CM

## 2023-03-16 ENCOUNTER — Ambulatory Visit (INDEPENDENT_AMBULATORY_CARE_PROVIDER_SITE_OTHER): Payer: Medicare Other

## 2023-03-16 DIAGNOSIS — I495 Sick sinus syndrome: Secondary | ICD-10-CM

## 2023-03-16 LAB — CUP PACEART REMOTE DEVICE CHECK
Battery Remaining Longevity: 117 mo
Battery Remaining Percentage: 95.5 %
Battery Voltage: 3.01 V
Brady Statistic AP VP Percent: 9.6 %
Brady Statistic AP VS Percent: 14 %
Brady Statistic AS VP Percent: 1.5 %
Brady Statistic AS VS Percent: 74 %
Brady Statistic RA Percent Paced: 20 %
Brady Statistic RV Percent Paced: 16 %
Date Time Interrogation Session: 20240514032802
Implantable Lead Connection Status: 753985
Implantable Lead Connection Status: 753985
Implantable Lead Implant Date: 20230814
Implantable Lead Implant Date: 20230814
Implantable Lead Location: 753859
Implantable Lead Location: 753860
Implantable Pulse Generator Implant Date: 20230814
Lead Channel Impedance Value: 450 Ohm
Lead Channel Impedance Value: 490 Ohm
Lead Channel Pacing Threshold Amplitude: 0.5 V
Lead Channel Pacing Threshold Amplitude: 0.625 V
Lead Channel Pacing Threshold Pulse Width: 0.5 ms
Lead Channel Pacing Threshold Pulse Width: 0.5 ms
Lead Channel Sensing Intrinsic Amplitude: 1.9 mV
Lead Channel Sensing Intrinsic Amplitude: 12 mV
Lead Channel Setting Pacing Amplitude: 0.875
Lead Channel Setting Pacing Amplitude: 2.5 V
Lead Channel Setting Pacing Pulse Width: 0.5 ms
Lead Channel Setting Sensing Sensitivity: 2 mV
Pulse Gen Model: 2272
Pulse Gen Serial Number: 6843316

## 2023-03-22 ENCOUNTER — Other Ambulatory Visit (HOSPITAL_COMMUNITY): Payer: Self-pay | Admitting: *Deleted

## 2023-03-22 MED ORDER — DILTIAZEM HCL ER COATED BEADS 180 MG PO CP24: 180.0000 mg | ORAL_CAPSULE | Freq: Two times a day (BID) | ORAL | 6 refills | Status: DC

## 2023-03-26 ENCOUNTER — Telehealth (HOSPITAL_COMMUNITY): Payer: Self-pay | Admitting: *Deleted

## 2023-03-26 NOTE — Telephone Encounter (Signed)
Transmission received. Patient in AF at time of transmission for approximately 2hrs

## 2023-03-26 NOTE — Telephone Encounter (Addendum)
Jorja Loa PA reviewed transmission pt has been in/out afib since last PM around 9pm. HR currently 109. No heart rates less than 60 were noted on recent transmission. Pt has PRN cardizem she can use for elevated rates. If still in AF next week will call for assessment. Pt in agreement.

## 2023-03-26 NOTE — Telephone Encounter (Signed)
Pt woke up not feeling well this morning. She checked her HR and her machine is reading her HR in the 40s. Pt has taken her normal morning medications but unsure of what to do next. Instructed pt to send transmission so can see what rhythm she is currently in.

## 2023-03-30 ENCOUNTER — Other Ambulatory Visit: Payer: Self-pay | Admitting: Gastroenterology

## 2023-03-30 DIAGNOSIS — K219 Gastro-esophageal reflux disease without esophagitis: Secondary | ICD-10-CM

## 2023-03-30 DIAGNOSIS — G8929 Other chronic pain: Secondary | ICD-10-CM

## 2023-04-03 ENCOUNTER — Other Ambulatory Visit: Payer: Self-pay | Admitting: Gastroenterology

## 2023-04-03 DIAGNOSIS — G8929 Other chronic pain: Secondary | ICD-10-CM

## 2023-04-03 DIAGNOSIS — K219 Gastro-esophageal reflux disease without esophagitis: Secondary | ICD-10-CM

## 2023-04-12 NOTE — Progress Notes (Signed)
Remote pacemaker transmission.   

## 2023-04-13 DIAGNOSIS — D6869 Other thrombophilia: Secondary | ICD-10-CM | POA: Diagnosis not present

## 2023-04-13 DIAGNOSIS — Z950 Presence of cardiac pacemaker: Secondary | ICD-10-CM | POA: Diagnosis not present

## 2023-05-25 DIAGNOSIS — I4891 Unspecified atrial fibrillation: Secondary | ICD-10-CM | POA: Diagnosis not present

## 2023-05-25 DIAGNOSIS — N3289 Other specified disorders of bladder: Secondary | ICD-10-CM | POA: Diagnosis not present

## 2023-05-25 DIAGNOSIS — N390 Urinary tract infection, site not specified: Secondary | ICD-10-CM | POA: Diagnosis not present

## 2023-05-25 DIAGNOSIS — J984 Other disorders of lung: Secondary | ICD-10-CM | POA: Diagnosis not present

## 2023-05-25 DIAGNOSIS — K8020 Calculus of gallbladder without cholecystitis without obstruction: Secondary | ICD-10-CM | POA: Diagnosis not present

## 2023-05-25 DIAGNOSIS — K5730 Diverticulosis of large intestine without perforation or abscess without bleeding: Secondary | ICD-10-CM | POA: Diagnosis not present

## 2023-05-25 DIAGNOSIS — K828 Other specified diseases of gallbladder: Secondary | ICD-10-CM | POA: Diagnosis not present

## 2023-05-25 DIAGNOSIS — I517 Cardiomegaly: Secondary | ICD-10-CM | POA: Diagnosis not present

## 2023-05-25 DIAGNOSIS — R0989 Other specified symptoms and signs involving the circulatory and respiratory systems: Secondary | ICD-10-CM | POA: Diagnosis not present

## 2023-05-25 DIAGNOSIS — R109 Unspecified abdominal pain: Secondary | ICD-10-CM | POA: Diagnosis not present

## 2023-05-25 DIAGNOSIS — Z950 Presence of cardiac pacemaker: Secondary | ICD-10-CM | POA: Diagnosis not present

## 2023-05-25 DIAGNOSIS — R0789 Other chest pain: Secondary | ICD-10-CM | POA: Diagnosis not present

## 2023-05-26 DIAGNOSIS — R0989 Other specified symptoms and signs involving the circulatory and respiratory systems: Secondary | ICD-10-CM | POA: Diagnosis not present

## 2023-05-26 DIAGNOSIS — K828 Other specified diseases of gallbladder: Secondary | ICD-10-CM | POA: Diagnosis not present

## 2023-05-26 DIAGNOSIS — I517 Cardiomegaly: Secondary | ICD-10-CM | POA: Diagnosis not present

## 2023-05-26 DIAGNOSIS — N3289 Other specified disorders of bladder: Secondary | ICD-10-CM | POA: Diagnosis not present

## 2023-05-26 DIAGNOSIS — K5730 Diverticulosis of large intestine without perforation or abscess without bleeding: Secondary | ICD-10-CM | POA: Diagnosis not present

## 2023-05-26 DIAGNOSIS — R0789 Other chest pain: Secondary | ICD-10-CM | POA: Diagnosis not present

## 2023-05-26 DIAGNOSIS — K8020 Calculus of gallbladder without cholecystitis without obstruction: Secondary | ICD-10-CM | POA: Diagnosis not present

## 2023-05-26 DIAGNOSIS — J984 Other disorders of lung: Secondary | ICD-10-CM | POA: Diagnosis not present

## 2023-06-07 NOTE — Telephone Encounter (Signed)
Please see last note on 03/26/23. Appears patient has been in AF and I am not able to locate and follow up note from 03/26/23.

## 2023-06-09 NOTE — Telephone Encounter (Signed)
PPM shows 28% burden. Patient happy with her present treatment. She does not want to start AAD.    Patient does not want to follow up in [place] moving forward. Follow up is planned for DR. Lalla Brothers in [place] - will request follow up with him.

## 2023-06-14 DIAGNOSIS — N3001 Acute cystitis with hematuria: Secondary | ICD-10-CM | POA: Diagnosis not present

## 2023-06-14 DIAGNOSIS — R399 Unspecified symptoms and signs involving the genitourinary system: Secondary | ICD-10-CM | POA: Diagnosis not present

## 2023-06-14 DIAGNOSIS — Z1330 Encounter for screening examination for mental health and behavioral disorders, unspecified: Secondary | ICD-10-CM | POA: Diagnosis not present

## 2023-06-14 DIAGNOSIS — N3000 Acute cystitis without hematuria: Secondary | ICD-10-CM | POA: Diagnosis not present

## 2023-06-15 ENCOUNTER — Ambulatory Visit (INDEPENDENT_AMBULATORY_CARE_PROVIDER_SITE_OTHER): Payer: Medicare Other

## 2023-06-15 DIAGNOSIS — I495 Sick sinus syndrome: Secondary | ICD-10-CM | POA: Diagnosis not present

## 2023-06-17 ENCOUNTER — Ambulatory Visit (INDEPENDENT_AMBULATORY_CARE_PROVIDER_SITE_OTHER): Payer: Medicare Other | Admitting: Family Medicine

## 2023-06-17 ENCOUNTER — Encounter: Payer: Self-pay | Admitting: Family Medicine

## 2023-06-17 VITALS — BP 118/72 | HR 77 | Temp 97.7°F | Resp 16 | Ht 65.5 in | Wt 155.0 lb

## 2023-06-17 DIAGNOSIS — E559 Vitamin D deficiency, unspecified: Secondary | ICD-10-CM

## 2023-06-17 DIAGNOSIS — N390 Urinary tract infection, site not specified: Secondary | ICD-10-CM | POA: Diagnosis not present

## 2023-06-17 DIAGNOSIS — I480 Paroxysmal atrial fibrillation: Secondary | ICD-10-CM

## 2023-06-17 DIAGNOSIS — G40909 Epilepsy, unspecified, not intractable, without status epilepticus: Secondary | ICD-10-CM

## 2023-06-17 DIAGNOSIS — I495 Sick sinus syndrome: Secondary | ICD-10-CM

## 2023-06-17 DIAGNOSIS — I10 Essential (primary) hypertension: Secondary | ICD-10-CM

## 2023-06-17 DIAGNOSIS — Z13820 Encounter for screening for osteoporosis: Secondary | ICD-10-CM | POA: Diagnosis not present

## 2023-06-17 DIAGNOSIS — N1831 Chronic kidney disease, stage 3a: Secondary | ICD-10-CM

## 2023-06-17 DIAGNOSIS — E538 Deficiency of other specified B group vitamins: Secondary | ICD-10-CM

## 2023-06-17 DIAGNOSIS — R7309 Other abnormal glucose: Secondary | ICD-10-CM

## 2023-06-17 NOTE — Progress Notes (Unsigned)
SUBJECTIVE:   Chief Complaint  Patient presents with   Establish Care   HPI Presents to clinic to establish care  No acute concerns  A Fib/ICD/Pacemaker/HTN Asymptomatic.  Follows with cardiology.  Now sees Dr Lalla Brothers. Currently takes Eliquis 5 mg BID, Cardizem 60 mg as needed for increased palpitations and Cardizem 180 mg BID Has not had any further episodes of heart palpitations since ICD placed 1 year ago.  Seizure like activity Unsure is had seizure.  Was placed on Keppra 500 mg BID for seizure like activity.  Has not been following with Neurology.  Cannot recall previous neurologist.  Cannot recall if having EEG.  Review of chart cannot find any documentation of neurology visit or confirmed seizure activity documentation   PERTINENT PMH / PSH: CAD HTN A Fib   OBJECTIVE:  BP 118/72   Pulse 77   Temp 97.7 F (36.5 C)   Resp 16   Ht 5' 5.5" (1.664 m)   Wt 155 lb (70.3 kg)   SpO2 94%   BMI 25.40 kg/m    Physical Exam Vitals reviewed.  Constitutional:      General: She is not in acute distress.    Appearance: She is not ill-appearing.  HENT:     Head: Normocephalic.     Right Ear: Tympanic membrane, ear canal and external ear normal.     Left Ear: Tympanic membrane, ear canal and external ear normal.     Nose: Nose normal.     Mouth/Throat:     Mouth: Mucous membranes are moist.  Eyes:     Extraocular Movements: Extraocular movements intact.     Conjunctiva/sclera: Conjunctivae normal.     Pupils: Pupils are equal, round, and reactive to light.  Neck:     Thyroid: No thyromegaly or thyroid tenderness.     Vascular: No carotid bruit.  Cardiovascular:     Rate and Rhythm: Normal rate and regular rhythm.     Pulses: Normal pulses.     Heart sounds: Normal heart sounds.  Pulmonary:     Effort: Pulmonary effort is normal.     Breath sounds: Normal breath sounds.  Abdominal:     General: Bowel sounds are normal. There is no distension.     Palpations:  Abdomen is soft.     Tenderness: There is no abdominal tenderness. There is no right CVA tenderness, left CVA tenderness, guarding or rebound.  Musculoskeletal:        General: Normal range of motion.     Cervical back: Normal range of motion.     Right lower leg: No edema.     Left lower leg: No edema.  Lymphadenopathy:     Cervical: No cervical adenopathy.  Skin:    Capillary Refill: Capillary refill takes less than 2 seconds.  Neurological:     General: No focal deficit present.     Mental Status: She is alert and oriented to person, place, and time. Mental status is at baseline.     Motor: No weakness.  Psychiatric:        Mood and Affect: Mood normal.        Behavior: Behavior normal.        Thought Content: Thought content normal.        Judgment: Judgment normal.        06/17/2023    3:16 PM  Depression screen PHQ 2/9  Decreased Interest 0  Down, Depressed, Hopeless 0  PHQ - 2 Score 0  Altered sleeping 0  Tired, decreased energy 1  Change in appetite 0  Feeling bad or failure about yourself  0  Trouble concentrating 0  Moving slowly or fidgety/restless 0  Suicidal thoughts 0  PHQ-9 Score 1  Difficult doing work/chores Not difficult at all      06/17/2023    3:16 PM  GAD 7 : Generalized Anxiety Score  Nervous, Anxious, on Edge 0  Control/stop worrying 0  Worry too much - different things 0  Trouble relaxing 0  Restless 0  Easily annoyed or irritable 0  Afraid - awful might happen 0  Total GAD 7 Score 0  Anxiety Difficulty Not difficult at all    ASSESSMENT/PLAN:  Seizure disorder [facility]) Assessment & Plan: Has not had any recent seizure activity.  Cannot recall having seizure Taking Keppra 500 mg BID, currently managed by former PCP Would like Neurology's input for need of continued medication  Orders: -     Levetiracetam level -     Ambulatory referral to Neurology  Screening for osteoporosis -     DG Bone Density; Future  Vitamin D  deficiency -     VITAMIN D 25 Hydroxy (Vit-D Deficiency, Fractures)  Vitamin B 12 deficiency -     Vitamin B12  Paroxysmal atrial fibrillation (HCC) -     CBC  Stage 3a chronic kidney disease (HCC) -     Comprehensive metabolic panel  Benign essential HTN Assessment & Plan: Chronic  Well controlled Follows with Cardiology   Tachycardia-bradycardia [facility]) Assessment & Plan: ICD/pacemaker Follows with Dr Jess Barters Cardizem 180 mg BID  Has prn Cardizem 60 mg but has not required use     PDMP reviewed  Return if symptoms worsen or fail to improve.  Dana Allan, MD

## 2023-06-17 NOTE — Patient Instructions (Signed)
It was a pleasure meeting you today. Thank you for allowing me to take part in your health care.  Our goals for today as we discussed include:  We will get some labs today.  If they are abnormal or we need to do something about them, I will call you.  If they are normal, I will send you a message on MyChart (if it is active) or a letter in the mail.  If you don't hear from [place] in 2 weeks, please call the office at the number below.   Follow up as needed   If you have any questions or concerns, please do not hesitate to call the office at [phone].  I look forward to our next visit and until then take care and stay safe.  Regards,   Dana Allan, MD   [facility]

## 2023-06-18 ENCOUNTER — Telehealth: Payer: Self-pay

## 2023-06-18 LAB — CBC
HCT: 45.4 % (ref 36.0–46.0)
Hemoglobin: 14.8 g/dL (ref 12.0–15.0)
MCHC: 32.5 g/dL (ref 30.0–36.0)
MCV: 91.4 fl (ref 78.0–100.0)
Platelets: 312 10*3/uL (ref 150.0–400.0)
RBC: 4.97 Mil/uL (ref 3.87–5.11)
RDW: 13.5 % (ref 11.5–15.5)
WBC: 6.6 10*3/uL (ref 4.0–10.5)

## 2023-06-18 LAB — COMPREHENSIVE METABOLIC PANEL
ALT: 11 U/L (ref 0–35)
AST: 15 U/L (ref 0–37)
Albumin: 4.2 g/dL (ref 3.5–5.2)
Alkaline Phosphatase: 53 U/L (ref 39–117)
BUN: 16 mg/dL (ref 6–23)
CO2: 28 meq/L (ref 19–32)
Calcium: 10 mg/dL (ref 8.4–10.5)
Chloride: 99 meq/L (ref 96–112)
Creatinine, Ser: 1.17 mg/dL (ref 0.40–1.20)
GFR: 41.37 mL/min — ABNORMAL LOW (ref >60.00)
Glucose, Bld: 90 mg/dL (ref 70–99)
Potassium: 4.1 meq/L (ref 3.5–5.1)
Sodium: 134 meq/L — ABNORMAL LOW (ref 135–145)
Total Bilirubin: 0.5 mg/dL (ref 0.2–1.2)
Total Protein: 7.1 g/dL (ref 6.0–8.3)

## 2023-06-18 LAB — VITAMIN D 25 HYDROXY (VIT D DEFICIENCY, FRACTURES): VITD: >120 ng/mL

## 2023-06-18 LAB — VITAMIN B12: Vitamin B-12: 505 pg/mL (ref 211–911)

## 2023-06-18 NOTE — Telephone Encounter (Signed)
LMTCB

## 2023-06-18 NOTE — Telephone Encounter (Signed)
Patient needs to stop vitamin D supplementation. Forwarding to PCP for further follow-up on this.

## 2023-06-18 NOTE — Telephone Encounter (Signed)
CRITICAL VALUE STICKER  CRITICAL VALUE: Vitamin D > 120  RECEIVER (on-site recipient of call): Shanda Bumps, CMA  DATE & TIME NOTIFIED: 06/18/2023 @ 3:07pm  MESSENGER (representative from lab): Hope  MD NOTIFIED: Dr. Birdie Sons  TIME OF NOTIFICATION:  RESPONSE:

## 2023-06-18 NOTE — Telephone Encounter (Signed)
Per verbal from Dr. Birdie Sons pt needs to stop all vitamin d supplements.

## 2023-06-22 LAB — LEVETIRACETAM LEVEL: Keppra (Levetiracetam): 26.5 ug/mL

## 2023-06-30 NOTE — Progress Notes (Signed)
Remote pacemaker transmission.   

## 2023-07-06 ENCOUNTER — Encounter: Payer: Self-pay | Admitting: Family Medicine

## 2023-07-07 ENCOUNTER — Encounter: Payer: Self-pay | Admitting: Family Medicine

## 2023-07-07 ENCOUNTER — Other Ambulatory Visit: Payer: Self-pay | Admitting: Family Medicine

## 2023-07-07 DIAGNOSIS — Z13820 Encounter for screening for osteoporosis: Secondary | ICD-10-CM | POA: Insufficient documentation

## 2023-07-07 DIAGNOSIS — G40909 Epilepsy, unspecified, not intractable, without status epilepticus: Secondary | ICD-10-CM | POA: Insufficient documentation

## 2023-07-07 DIAGNOSIS — G8929 Other chronic pain: Secondary | ICD-10-CM

## 2023-07-07 DIAGNOSIS — K219 Gastro-esophageal reflux disease without esophagitis: Secondary | ICD-10-CM

## 2023-07-07 DIAGNOSIS — E538 Deficiency of other specified B group vitamins: Secondary | ICD-10-CM | POA: Insufficient documentation

## 2023-07-07 NOTE — Assessment & Plan Note (Signed)
ICD/pacemaker Follows with Dr Jess Barters Cardizem 180 mg BID  Has prn Cardizem 60 mg but has not required use

## 2023-07-07 NOTE — Telephone Encounter (Signed)
Previously filled by Tressia Danas, MD  Last OV: 06/17/2023 Next OV: not scheduled

## 2023-07-07 NOTE — Assessment & Plan Note (Signed)
Chronic  Well controlled Follows with Cardiology

## 2023-07-07 NOTE — Assessment & Plan Note (Signed)
Has not had any recent seizure activity.  Cannot recall having seizure Taking Keppra 500 mg BID, currently managed by former PCP Would like Neurology's input for need of continued medication

## 2023-07-21 ENCOUNTER — Encounter: Payer: Self-pay | Admitting: Neurology

## 2023-07-23 ENCOUNTER — Telehealth: Payer: Self-pay | Admitting: Family Medicine

## 2023-07-23 NOTE — Telephone Encounter (Signed)
Called pt and advised her that she will need to be seen in office for this. She stated she is alone and does not have anyone to bring her. I did advise her to go to UC, but she said she did not have away. She stated that she has never not be able to get medication for her UTI.

## 2023-07-23 NOTE — Telephone Encounter (Signed)
Pt called stating her UTI that she was treated for on 8/15 is not fully gone. Pt stated she is still hurting down there and want another antibotic

## 2023-07-26 NOTE — Telephone Encounter (Signed)
Called and informed her of this information and she stated she is going out of town.

## 2023-07-27 NOTE — Telephone Encounter (Signed)
Called and spoke to pt yesterday she stated that she had an old antibiotic and she has took them. She is feeling better. She is going out of town at the end of this week and will be gone for a while. She stated that she will call when she gets back in town if she needs to [place] Dr. Clent Ridges.

## 2023-08-16 ENCOUNTER — Ambulatory Visit (INDEPENDENT_AMBULATORY_CARE_PROVIDER_SITE_OTHER): Payer: Medicare Other | Admitting: Family Medicine

## 2023-08-16 ENCOUNTER — Encounter: Payer: Self-pay | Admitting: Family Medicine

## 2023-08-16 VITALS — BP 114/64 | HR 96 | Temp 97.7°F | Resp 16 | Ht 65.5 in | Wt 157.0 lb

## 2023-08-16 DIAGNOSIS — K649 Unspecified hemorrhoids: Secondary | ICD-10-CM | POA: Diagnosis not present

## 2023-08-16 DIAGNOSIS — G40909 Epilepsy, unspecified, not intractable, without status epilepticus: Secondary | ICD-10-CM | POA: Diagnosis not present

## 2023-08-16 DIAGNOSIS — N390 Urinary tract infection, site not specified: Secondary | ICD-10-CM | POA: Diagnosis not present

## 2023-08-16 DIAGNOSIS — N814 Uterovaginal prolapse, unspecified: Secondary | ICD-10-CM

## 2023-08-16 DIAGNOSIS — K219 Gastro-esophageal reflux disease without esophagitis: Secondary | ICD-10-CM

## 2023-08-16 MED ORDER — SACCHAROMYCES BOULARDII 250 MG PO CAPS: 250.0000 mg | ORAL_CAPSULE | Freq: Every day | ORAL | 0 refills | Status: AC

## 2023-08-16 MED ORDER — HYDROCORTISONE (PERIANAL) 2.5 % EX CREA: 1.0000 | TOPICAL_CREAM | Freq: Two times a day (BID) | CUTANEOUS | 0 refills | Status: AC

## 2023-08-16 NOTE — Patient Instructions (Addendum)
It was a pleasure meeting you today. Thank you for allowing me to take part in your health care.  Our goals for today as we discussed include:  Repeat urine in 2-3 weeks Please drop off urine to lab and will notify of results  Probiotics daily  Anusol ointment two times a day for hemorrhoids.  If worsening please follow up with PCP  Follow up as needed   If you have any questions or concerns, please do not hesitate to call the office at [phone].  I look forward to our next visit and until then take care and stay safe.  Regards,   Dana Allan, MD   [facility]

## 2023-08-16 NOTE — Progress Notes (Unsigned)
SUBJECTIVE:   Chief Complaint  Patient presents with   Hemorrhoids   HPI Presents to clinic with family member with multiple concerns  Discussed the use of AI scribe software for clinical note transcription with the patient, who gave verbal consent to proceed.  History of Present Illness The patient, known to have a history of recurrent urinary tract infections (UTIs) and atrial fibrillation, presented with ongoing issues related to UTIs. She reported having taken three rounds of antibiotics, including cephalexin and amoxicillin, with recurrent symptoms following each course. The patient expressed frustration with the recurrent nature of the UTIs and the difficulty in obtaining timely treatment due to her inability to drive.  In addition to the UTI concerns, the patient reported significant constipation and hemorrhoids, which have been causing discomfort and occasional bleeding, particularly during bowel movements. The patient also mentioned a sensation of a bulging bladder, which she believes is due to a prolapsed uterus. This has been causing discomfort while walking and has been associated with occasional incontinence.  The patient also reported experiencing bloating and excessive gas, which has been causing significant embarrassment and has affected her social activities. She expressed interest in trying a probiotic supplement, Align, to help manage these symptoms.  The patient also mentioned being on Eliquis for atrial fibrillation, and there was some confusion about who was managing this medication. The patient was unsure who initially prescribed the Eliquis and reported that her pharmacy had been having trouble refilling the prescription.    PERTINENT PMH / PSH: As above  OBJECTIVE:  BP 114/64   Pulse 96   Temp 97.7 F (36.5 C)   Resp 16   Ht 5' 5.5" (1.664 m)   Wt 157 lb (71.2 kg)   SpO2 94%   BMI 25.73 kg/m    Physical Exam Vitals reviewed.  Constitutional:       General: She is not in acute distress.    Appearance: Normal appearance. She is normal weight. She is not ill-appearing, toxic-appearing or diaphoretic.  Eyes:     General:        Right eye: No discharge.        Left eye: No discharge.     Conjunctiva/sclera: Conjunctivae normal.  Cardiovascular:     Rate and Rhythm: Normal rate and regular rhythm.     Heart sounds: Normal heart sounds.  Pulmonary:     Effort: Pulmonary effort is normal.     Breath sounds: Normal breath sounds.  Abdominal:     General: Bowel sounds are normal.  Musculoskeletal:        General: Normal range of motion.  Skin:    General: Skin is warm and dry.  Neurological:     General: No focal deficit present.     Mental Status: She is alert and oriented to person, place, and time. Mental status is at baseline.  Psychiatric:        Mood and Affect: Mood normal.        Behavior: Behavior normal.        Thought Content: Thought content normal.        Judgment: Judgment normal.        08/16/2023    4:33 PM 06/17/2023    3:16 PM  Depression screen PHQ 2/9  Decreased Interest 0 0  Down, Depressed, Hopeless 0 0  PHQ - 2 Score 0 0  Altered sleeping 0 0  Tired, decreased energy 1 1  Change in appetite 0 0  Feeling bad  or failure about yourself  0 0  Trouble concentrating 0 0  Moving slowly or fidgety/restless 0 0  Suicidal thoughts 0 0  PHQ-9 Score 1 1  Difficult doing work/chores Not difficult at all Not difficult at all      08/16/2023    4:34 PM 06/17/2023    3:16 PM  GAD 7 : Generalized Anxiety Score  Nervous, Anxious, on Edge 0 0  Control/stop worrying 0 0  Worry too much - different things 0 0  Trouble relaxing 0 0  Restless 0 0  Easily annoyed or irritable 0 0  Afraid - awful might happen 0 0  Total GAD 7 Score 0 0  Anxiety Difficulty Not difficult at all Not difficult at all    ASSESSMENT/PLAN:  Hemorrhoids, unspecified hemorrhoid type Assessment & Plan: Reports of bleeding with bowel  movements and discomfort. Constipation likely contributing to symptoms. -Prescribe hemorrhoid cream for symptomatic relief. -Continue Miralax for constipation management. -Consider referral to GI if symptoms persist or worsen.  Orders: -     Hydrocortisone (Perianal); Place 1 Application rectally 2 (two) times daily.  Dispense: 30 g; Refill: 0  Prolapsed uterus Assessment & Plan: Reports of discomfort and bulging sensation. Possible contribution to urinary symptoms. -Consider referral to gynecology for evaluation and potential pessary placement.  Orders: -     Ambulatory referral to Obstetrics / Gynecology  Recurrent UTI Assessment & Plan: Recent courses of cephalexin and amoxicillin with recurrent symptoms. Unclear if symptoms are due to UTI or vaginal atrophy. No recent urine culture to confirm diagnosis. -Collect urine sample in 2 weeks for culture and sensitivity to guide antibiotic choice. Provided patient with sample bottles with name and DOB labeled. -Consider probiotics for potential UTI prevention.  Orders: -     Urine Culture; Future -     Urinalysis, Routine w reflex microscopic; Future -     Saccharomyces boulardii; Take 1 capsule (250 mg total) by mouth daily.  Dispense: 90 capsule; Refill: 0  Seizure disorder [facility]) Assessment & Plan: Has not had any recent seizure activity.  Cannot recall having seizure Taking Keppra 500 mg BID, currently managed by former PCP Would like Neurology's input for need of continued medication Neurology appointment scheduled for 10/25   Gastroesophageal reflux disease without esophagitis Assessment & Plan: Managed with PPI -Continue Protonix 40 mg daily -Continue Famotidine 20 mg BID -Recommended to f/u with GI annually, last seen Dr Orvan Falconer in 2022.  Recommend she follow up with GI if symptoms worsening.      General Health Maintenance -Continue follow-up with cardiology for atrial fibrillation management. -Continue follow-up  with neurology for Keppra management.     PDMP reviewed  Return if symptoms worsen or fail to improve, for PCP.  Dana Allan, MD

## 2023-08-17 ENCOUNTER — Encounter: Payer: Self-pay | Admitting: Family Medicine

## 2023-08-17 DIAGNOSIS — N814 Uterovaginal prolapse, unspecified: Secondary | ICD-10-CM | POA: Insufficient documentation

## 2023-08-17 DIAGNOSIS — N390 Urinary tract infection, site not specified: Secondary | ICD-10-CM | POA: Insufficient documentation

## 2023-08-17 DIAGNOSIS — K649 Unspecified hemorrhoids: Secondary | ICD-10-CM | POA: Insufficient documentation

## 2023-08-17 NOTE — Assessment & Plan Note (Signed)
Managed with PPI -Continue Protonix 40 mg daily -Continue Famotidine 20 mg BID -Recommended to f/u with GI annually, last seen Dr Orvan Falconer in 2022.  Recommend she follow up with GI if symptoms worsening.

## 2023-08-17 NOTE — Assessment & Plan Note (Signed)
Reports of discomfort and bulging sensation. Possible contribution to urinary symptoms. -Consider referral to gynecology for evaluation and potential pessary placement.

## 2023-08-17 NOTE — Assessment & Plan Note (Addendum)
Recent courses of cephalexin and amoxicillin with recurrent symptoms. Unclear if symptoms are due to UTI or vaginal atrophy. No recent urine culture to confirm diagnosis. -Collect urine sample in 2 weeks for culture and sensitivity to guide antibiotic choice. Provided patient with sample bottles with name and DOB labeled. -Consider probiotics for potential UTI prevention.

## 2023-08-17 NOTE — Assessment & Plan Note (Signed)
Has not had any recent seizure activity.  Cannot recall having seizure Taking Keppra 500 mg BID, currently managed by former PCP Would like Neurology's input for need of continued medication Neurology appointment scheduled for 10/25

## 2023-08-17 NOTE — Assessment & Plan Note (Signed)
Reports of bleeding with bowel movements and discomfort. Constipation likely contributing to symptoms. -Prescribe hemorrhoid cream for symptomatic relief. -Continue Miralax for constipation management. -Consider referral to GI if symptoms persist or worsen.

## 2023-08-25 ENCOUNTER — Telehealth: Payer: Self-pay | Admitting: Cardiology

## 2023-08-25 NOTE — Telephone Encounter (Signed)
Spoke with the patient's daughter-in-law who states that the patient accidentally took an extra dose of diltiazem 180 mg this morning an hour after her first dose. She is not sure how the patient is feeling as she is not with her at the moment. I advised that she should just monitor for any lightheadedness or dizziness. She does have a pacemaker in place. She will let us know if there are any other concerns.

## 2023-08-25 NOTE — Telephone Encounter (Signed)
Pt c/o medication issue:  1. Name of Medication: diltiazem (CARDIZEM CD) 180 MG 24 hr capsule   2. How are you currently taking this medication (dosage and times per day)?    3. Are you having a reaction (difficulty breathing--STAT)? no  4. What is your medication issue? Daughter in law calling to say patient took the medicine at 7am this morning, but took it again at 8am not realizing she already took it. Calling to see if she would be okay and would she need to still take the 2nd dosage for today.

## 2023-08-27 ENCOUNTER — Ambulatory Visit: Payer: Medicare Other | Admitting: Neurology

## 2023-09-13 ENCOUNTER — Encounter: Payer: Medicare Other | Admitting: Obstetrics & Gynecology

## 2023-09-14 ENCOUNTER — Ambulatory Visit (INDEPENDENT_AMBULATORY_CARE_PROVIDER_SITE_OTHER): Payer: Medicare Other

## 2023-09-14 DIAGNOSIS — I495 Sick sinus syndrome: Secondary | ICD-10-CM

## 2023-09-16 LAB — CUP PACEART REMOTE DEVICE CHECK
Battery Remaining Longevity: 110 mo
Battery Remaining Percentage: 91 %
Battery Voltage: 3.01 V
Brady Statistic AP VP Percent: 7.4 %
Brady Statistic AP VS Percent: 12 %
Brady Statistic AS VP Percent: 1.7 %
Brady Statistic AS VS Percent: 79 %
Brady Statistic RA Percent Paced: 9.6 %
Brady Statistic RV Percent Paced: 20 %
Date Time Interrogation Session: 20241112020032
Implantable Lead Connection Status: 753985
Implantable Lead Connection Status: 753985
Implantable Lead Implant Date: 20230814
Implantable Lead Implant Date: 20230814
Implantable Lead Location: 753859
Implantable Lead Location: 753860
Implantable Pulse Generator Implant Date: 20230814
Lead Channel Impedance Value: 480 Ohm
Lead Channel Impedance Value: 530 Ohm
Lead Channel Pacing Threshold Amplitude: 0.5 V
Lead Channel Pacing Threshold Amplitude: 0.75 V
Lead Channel Pacing Threshold Pulse Width: 0.5 ms
Lead Channel Pacing Threshold Pulse Width: 0.5 ms
Lead Channel Sensing Intrinsic Amplitude: 1.2 mV
Lead Channel Sensing Intrinsic Amplitude: 12 mV
Lead Channel Setting Pacing Amplitude: 1 V
Lead Channel Setting Pacing Amplitude: 2.5 V
Lead Channel Setting Pacing Pulse Width: 0.5 ms
Lead Channel Setting Sensing Sensitivity: 2 mV
Pulse Gen Model: 2272
Pulse Gen Serial Number: 6843316

## 2023-10-08 DIAGNOSIS — N390 Urinary tract infection, site not specified: Secondary | ICD-10-CM | POA: Diagnosis not present

## 2023-10-08 LAB — URINALYSIS, ROUTINE W REFLEX MICROSCOPIC
Bilirubin Urine: NEGATIVE
Hgb urine dipstick: NEGATIVE
Ketones, ur: NEGATIVE
Leukocytes,Ua: NEGATIVE
Nitrite: NEGATIVE
RBC / HPF: NONE SEEN (ref 0–?)
Specific Gravity, Urine: 1.025 (ref 1.000–1.030)
Total Protein, Urine: NEGATIVE
Urine Glucose: NEGATIVE
Urobilinogen, UA: 0.2 (ref 0.0–1.0)
pH: 6 (ref 5.0–8.0)

## 2023-10-09 LAB — URINE CULTURE
MICRO NUMBER:: 15819117
Result:: NO GROWTH
SPECIMEN QUALITY:: ADEQUATE

## 2023-10-11 ENCOUNTER — Encounter: Payer: Self-pay | Admitting: Family Medicine

## 2023-10-12 NOTE — Progress Notes (Signed)
Remote pacemaker transmission.   

## 2023-10-13 ENCOUNTER — Telehealth: Payer: Self-pay | Admitting: Family Medicine

## 2023-10-13 NOTE — Telephone Encounter (Signed)
Called pt with lab results. Patient reviewed all information and repeated back to me. Will call if any questions.

## 2023-10-13 NOTE — Telephone Encounter (Signed)
Patient called requesting lab results

## 2023-11-01 ENCOUNTER — Ambulatory Visit (INDEPENDENT_AMBULATORY_CARE_PROVIDER_SITE_OTHER): Payer: Medicare Other | Admitting: *Deleted

## 2023-11-01 VITALS — Ht 65.5 in | Wt 156.4 lb

## 2023-11-01 DIAGNOSIS — Z0000 Encounter for general adult medical examination without abnormal findings: Secondary | ICD-10-CM

## 2023-11-01 NOTE — Patient Instructions (Signed)
Carla Flowers , Thank you for taking time to come for your Medicare Wellness Visit. I appreciate your ongoing commitment to your health goals. Please review the following plan we discussed and let me know if I can assist you in the future.   Referrals/Orders/Follow-Ups/Clinician Recommendations: Remember to update your vaccine. Remember to call and schedule your Bone Density test.  This is a list of the screening recommended for you and due dates:  Health Maintenance  Topic Date Due   DEXA scan (bone density measurement)  Never done   COVID-19 Vaccine (3 - 2024-25 season) 07/04/2023   Medicare Annual Wellness Visit  10/31/2024   Pneumonia Vaccine  Completed   Flu Shot  Completed   HPV Vaccine  Aged Out   DTaP/Tdap/Td vaccine  Discontinued   Zoster (Shingles) Vaccine  Discontinued    Advanced directives: (Copy Requested) Please bring a copy of your health care power of attorney and living will to the office to be added to your chart at your convenience.  Next Medicare Annual Wellness Visit scheduled for next year: Yes 11/06/2024 @ 10:50

## 2023-11-01 NOTE — Progress Notes (Signed)
Subjective:   Carla Flowers is a 89 y.o. female who presents for Medicare Annual (Subsequent) preventive examination.  Visit Complete: Virtual I connected with  Carla Flowers on 11/01/23 by a audio enabled telemedicine application and verified that I am speaking with the correct person using two identifiers.  Patient Location: Home  Provider Location: Office/Clinic  I discussed the limitations of evaluation and management by telemedicine. The patient expressed understanding and agreed to proceed.  Vital Signs: Because this visit was a virtual/telehealth visit, some criteria may be missing or patient reported. Any vitals not documented were not able to be obtained and vitals that have been documented are patient reported.   Cardiac Risk Factors include: advanced age (>65men, >49 women);dyslipidemia;hypertension, Risk factor comments: pacemaker     Objective:    Today's Vitals   11/01/23 0902  Weight: 156 lb 6 oz (70.9 kg)  Height: 5' 5.5" (1.664 m)   Body mass index is 25.63 kg/m.     11/01/2023    9:26 AM 09/17/2022   10:58 AM 06/15/2022    1:56 PM 08/17/2020    3:52 AM 05/26/2020    1:52 AM 05/26/2016   12:16 PM 11/28/2014    1:35 PM  Advanced Directives  Does Patient Have a Medical Advance Directive? Yes No Yes No Yes No No  Type of Estate agent of [place];Living will  Healthcare Power of [employer] Power of Attorney    Does patient want to make changes to medical advance directive?     No - Patient declined    Copy of Healthcare Power of Attorney in Chart? No - copy requested  No - copy requested      Would patient like information on creating a medical advance directive?    No - Patient declined  No - patient declined information No - patient declined information    Current Medications (verified) Outpatient Encounter Medications as of 11/01/2023  Medication Sig   acetaminophen (TYLENOL) 500 MG tablet Take 500-1,000 mg by mouth  every 6 (six) hours as needed (pain.).   Coenzyme Q10 (COQ-10) 100 MG CAPS Take 100 mg by mouth daily.   Cranberry 500 MG CAPS Take 500 mg by mouth as needed.   diltiazem (CARDIZEM CD) 180 MG 24 hr capsule Take 1 capsule (180 mg total) by mouth 2 (two) times daily.   diltiazem (CARDIZEM) 60 MG tablet TAKE 1 TO 2 TABLETS BY MOUTH DAILY AS NEEDED FOR PALPITATIONS   ELIQUIS 5 MG TABS tablet TAKE 1 TABLET(5 MG) BY MOUTH TWICE DAILY   famotidine (PEPCID) 20 MG tablet TAKE 1 TABLET(20 MG) BY MOUTH TWICE DAILY   hydrocortisone (ANUSOL-HC) 2.5 % rectal cream Place 1 Application rectally 2 (two) times daily.   levETIRAcetam (KEPPRA) 500 MG tablet Take 500 mg by mouth Twice daily.   pantoprazole (PROTONIX) 40 MG tablet TAKE 1 TABLET(40 MG) BY MOUTH TWICE DAILY   vitamin B-12 (CYANOCOBALAMIN) 1000 MCG tablet Take 1,000 mcg by mouth daily.   saccharomyces boulardii (FLORASTOR) 250 MG capsule Take 1 capsule (250 mg total) by mouth daily. (Patient not taking: Reported on 11/01/2023)   No facility-administered encounter medications on file as of 11/01/2023.    Allergies (verified) Sulfa antibiotics, Amoxicillin, Codeine, Cortisone, Macrobid [nitrofurantoin], Sulfonamide derivatives, and Prednisone   History: Past Medical History:  Diagnosis Date   A-fib (HCC)    Asymmetric septal hypertrophy    Basal cell carcinoma    nose   GERD (gastroesophageal reflux disease)  H/O: rheumatic fever    Hyperlipidemia    Mitral valve prolapse    Palpitations 10/01/2009   Qualifier: Diagnosis of   By: Graciela Husbands, MD, Susie Cassette        Persistent atrial fibrillation with rapid ventricular response (HCC) 05/26/2020   Seizure (HCC)    SVT (supraventricular tachycardia) (HCC)    documented short RP tachycardia, previously adenosine sensitive   Past Surgical History:  Procedure Laterality Date   ABDOMINAL HYSTERECTOMY     BASAL CELL CARCINOMA EXCISION     nose   PACEMAKER IMPLANT N/A 06/15/2022    Procedure: PACEMAKER IMPLANT;  Surgeon: Lanier Prude, MD;  Location: [facility];  Service: Cardiovascular;  Laterality: N/A;   Family History  Problem Relation Age of Onset   Heart attack Mother    Stroke Mother    Prostate cancer Father    Atrial fibrillation Sister    Social History   Socioeconomic History   Marital status: Married    Spouse name: Not on file   Number of children: 4   Years of education: Not on file   Highest education level: Not on file  Occupational History   Occupation: retired  Tobacco Use   Smoking status: Never   Smokeless tobacco: Never   Tobacco comments:    Never smoke 03/03/22  Vaping Use   Vaping status: Never Used  Substance and Sexual Activity   Alcohol use: No   Drug use: No   Sexual activity: Not on file  Other Topics Concern   Not on file  Social History Narrative   Not on file   Social Drivers of Health   Financial Resource Strain: Low Risk  (11/01/2023)   Overall Financial Resource Strain (CARDIA)    Difficulty of Paying Living Expenses: Not hard at all  Food Insecurity: No Food Insecurity (11/01/2023)   Hunger Vital Sign    Worried About Running Out of Food in the Last Year: Never true    Ran Out of Food in the Last Year: Never true  Transportation Needs: No Transportation Needs (11/01/2023)   PRAPARE - Administrator, Civil Service (Medical): No    Lack of Transportation (Non-Medical): No  Physical Activity: Inactive (11/01/2023)   Exercise Vital Sign    Days of Exercise per Week: 0 days    Minutes of Exercise per Session: 0 min  Stress: No Stress Concern Present (11/01/2023)   [employer] of Occupational Health - Occupational Stress Questionnaire    Feeling of Stress : Not at all  Social Connections: Socially Isolated (11/01/2023)   Social Connection and Isolation Panel [NHANES]    Frequency of Communication with Friends and Family: Three times a week    Frequency of Social Gatherings with  Friends and Family: More than three times a week    Attends Religious Services: Never    Database administrator or Organizations: No    Attends Banker Meetings: Never    Marital Status: Widowed    Tobacco Counseling Counseling given: Not Answered Tobacco comments: Never smoke 03/03/22   Clinical Intake:  Pre-visit preparation completed: Yes  Pain : No/denies pain     BMI - recorded: 25.63 Nutritional Status: BMI 25 -29 Overweight Nutritional Risks: None Diabetes: No  How often do you need to have someone help you when you read instructions, pamphlets, or other written materials from your doctor or pharmacy?: 1 - Never  Interpreter Needed?: No  Information entered by ::  RPasty Arch LPN   Activities of Daily Living    11/01/2023    9:06 AM  In your present state of health, do you have any difficulty performing the following activities:  Hearing? 1  Comment wears aids  Vision? 0  Comment readers  Difficulty concentrating or making decisions? 1  Walking or climbing stairs? 0  Dressing or bathing? 0  Doing errands, shopping? 1  Preparing Food and eating ? N  Using the Toilet? N  In the past six months, have you accidently leaked urine? Y  Comment wears pads  Do you have problems with loss of bowel control? Y  Managing your Medications? N  Managing your Finances? Y  Housekeeping or managing your Housekeeping? N    Patient Care Team: Dana Allan, MD as PCP - General (Family Medicine) Lanier Prude, MD as PCP - Electrophysiology (Cardiology)  Indicate any recent Medical Services you may have received from other than [facility] providers in the past year (date may be approximate).     Assessment:   This is a routine wellness examination for [facility].  Hearing/Vision screen Hearing Screening - Comments:: Wears aids Vision Screening - Comments:: readers   Goals Addressed             This Visit's Progress    Patient Stated       Start back  walking if able       Depression Screen    11/01/2023    9:18 AM 08/16/2023    4:33 PM 06/17/2023    3:16 PM  PHQ 2/9 Scores  PHQ - 2 Score 0 0 0  PHQ- 9 Score 2 1 1     Fall Risk    11/01/2023    9:11 AM 08/16/2023    4:33 PM 06/17/2023    3:16 PM  Fall Risk   Falls in the past year? 0 0 1  Number falls in past yr: 0 0 0  Injury with Fall? 0 0 0  Risk for fall due to : No Fall Risks No Fall Risks No Fall Risks  Follow up Falls prevention discussed;Falls evaluation completed Falls evaluation completed     MEDICARE RISK AT HOME: Medicare Risk at Home Any stairs in or around the home?: Yes If so, are there any without handrails?: No Home free of loose throw rugs in walkways, pet beds, electrical cords, etc?: Yes Adequate lighting in your home to reduce risk of falls?: Yes Life alert?: No Use of a cane, walker or w/c?: No Grab bars in the bathroom?: No Shower chair or bench in shower?: Yes Elevated toilet seat or a handicapped toilet?: No    Cognitive Function:        11/01/2023    9:27 AM  6CIT Screen  What Year? 0 points  What month? 0 points  What time? 0 points  Count back from 20 0 points  Months in reverse 0 points  Repeat phrase 0 points  Total Score 0 points    Immunizations Immunization History  Administered Date(s) Administered   Fluad Quad(high Dose 65+) 08/31/2017, 09/17/2021   Influenza, High Dose Seasonal PF 08/02/2016, 08/31/2017, 08/10/2018, 07/31/2019, 07/31/2019   PFIZER(Purple Top)SARS-COV-2 Vaccination 12/09/2019, 12/30/2019   Pneumococcal Conjugate-13 09/29/2017   Pneumococcal Polysaccharide-23 08/02/2016    TDAP status: Due, Education has been provided regarding the importance of this vaccine. Advised may receive this vaccine at local pharmacy or Health Dept. Aware to provide a copy of the vaccination record if obtained from local pharmacy  or Health Dept. Verbalized acceptance and understanding.  Flu Vaccine status: Up to  date  Pneumococcal vaccine status: Up to date  Covid-19 vaccine status: Information provided on how to obtain vaccines.   Qualifies for Shingles Vaccine? Yes   Zostavax completed No   Shingrix Completed?: No.    Education has been provided regarding the importance of this vaccine. Patient has been advised to call insurance company to determine out of pocket expense if they have not yet received this vaccine. Advised may also receive vaccine at local pharmacy or Health Dept. Verbalized acceptance and understanding.  Screening Tests Health Maintenance  Topic Date Due   DEXA SCAN  Never done   COVID-19 Vaccine (3 - 2024-25 season) 07/04/2023   Medicare Annual Wellness (AWV)  10/29/2023   INFLUENZA VACCINE  01/31/2024 (Originally 06/03/2023)   Pneumonia Vaccine 53+ Years old  Completed   HPV VACCINES  Aged Out   DTaP/Tdap/Td  Discontinued   Zoster Vaccines- Shingrix  Discontinued    Health Maintenance  Health Maintenance Due  Topic Date Due   DEXA SCAN  Never done   COVID-19 Vaccine (3 - 2024-25 season) 07/04/2023   Medicare Annual Wellness (AWV)  10/29/2023    Colorectal cancer screening: No longer required.   Mammogram status: No longer required due to NA age.   Bone Density status Order was placed 06/17/23. Patient was reminded to call and schedule.   Lung Cancer Screening: (Low Dose CT Chest recommended if Age 56-80 years, 20 pack-year currently smoking OR have quit w/in 15years.) does not qualify.    Additional Screening:  Hepatitis C Screening: does not qualify; Completed NA age  Vision Screening: Recommended annual ophthalmology exams for early detection of glaucoma and other disorders of the eye. Is the patient up to date with their annual eye exam?  No  Who is the provider or what is the name of the office in which the patient attends annual eye exams? [facility] Patient was advised to schedule an appointment If pt is not established with a provider, would they like  to be referred to a provider to establish care? No .   Dental Screening: Recommended annual dental exams for proper oral hygiene   Community Resource Referral / Chronic Care Management: CRR required this visit?  No   CCM required this visit?  No     Plan:     I have personally reviewed and noted the following in the patient's chart:   Medical and social history Use of alcohol, tobacco or illicit drugs  Current medications and supplements including opioid prescriptions. Patient is not currently taking opioid prescriptions. Functional ability and status Nutritional status Physical activity Advanced directives List of other physicians Hospitalizations, surgeries, and ER visits in previous 12 months Vitals Screenings to include cognitive, depression, and falls Referrals and appointments  In addition, I have reviewed and discussed with patient certain preventive protocols, quality metrics, and best practice recommendations. A written personalized care plan for preventive services as well as general preventive health recommendations were provided to patient.     Sydell Axon, LPN   16/08/[id number]   After Visit Summary: (MyChart) Due to this being a telephonic visit, the after visit summary with patients personalized plan was offered to patient via MyChart   Nurse Notes: None

## 2023-11-15 ENCOUNTER — Ambulatory Visit: Payer: Self-pay | Admitting: Family Medicine

## 2023-11-15 NOTE — Telephone Encounter (Signed)
 Copied from CRM [id number]. Topic: Clinical - Red Word Triage >> Nov 15, 2023 10:31 AM Carla Flowers wrote: Red Word that prompted transfer to Nurse Triage:Pt called stating she fell a week ago and her right leg is still hurting. Pt stated it is bruised and black all over. Pt states it hurts at night and it has an indentation in it that is concerning her   Chief Complaint: Fall Symptoms: intermittent pain RIGHT knee pain Frequency: intermittent, worse at night Pertinent Negatives: Patient denies abuse, recurring falls Disposition: [] ED /[] Urgent Care (no appt availability in office) / [x] Appointment(In office/virtual)/ []  [facility]/ [] Home Care/ [] Refused Recommended Disposition /[] [facility]/ []  Follow-up with PCP Additional Notes: Patient reports fall in her garage 1 week ago. States both knees hit the ground, but her right knee hurts more. States that she had a lot of pain initially, then it felt just sore. Now she is having pain at night. Pt denies any new weaknesses, states that she is still able to attend to her normal activities. Pt also sts that her right knee was bruised black at first from my knee to my ankle now it just looks purple and splotchy. Patient is on blood thinners.   Pt sts that she is not able to get transportation until tomorrow. Appt scheduled with Dr. Hope on 01/14 at 10am. Will route HP to clinical review for further review  Reason for Disposition  MILD weakness (i.e., does not interfere with ability to work, go to school, normal activities)  (Exception: Mild weakness is a chronic symptom.)  Answer Assessment - Initial Assessment Questions 1. MECHANISM: How did the fall happen?     Was in her garage and fell suddenly, unsure if she tripped over something.   2. DOMESTIC VIOLENCE AND ELDER ABUSE SCREENING: Did you fall because someone pushed you or tried to hurt you? If Yes, ask: Are you safe now?     Denies being abused  3. ONSET: When  did the fall happen? (e.g., minutes, hours, or days ago)     1 week ago  4. LOCATION: What part of the body hit the ground? (e.g., back, buttocks, head, hips, knees, hands, head, stomach)     Fell on knees; right knee hurts  5. INJURY: Did you hurt (injure) yourself when you fell? If Yes, ask: What did you injure? Tell me more about this? (e.g., body area; type of injury; pain severity)    Both knee, but right knee is worse  6. PAIN: Is there any pain? If Yes, ask: How bad is the pain? (e.g., Scale 1-10; or mild,  moderate, severe)   - NONE (0): No pain   - MILD (1-3): Doesn't interfere with normal activities    - MODERATE (4-7): Interferes with normal activities or awakens from sleep    - SEVERE (8-10): Excruciating pain, unable to do any normal activities      Not severe but enough to keep me awake at night  7. SIZE: For cuts, bruises, or swelling, ask: How large is it? (e.g., inches or centimeters)      Noticing a dent below knee, states that her leg was black from knee to ankle  8. PREGNANCY: Is there any chance you are pregnant? When was your last menstrual period?     N/A  9. OTHER SYMPTOMS: Do you have any other symptoms? (e.g., dizziness, fever, weakness; new onset or worsening).      No other symptoms  10. CAUSE:  What do you think caused the fall (or falling)? (e.g., tripped, dizzy spell)       Unsure of cause, doesn't recall if she was dizzy or even if she tripped.  Protocols used: Falls and [facility]

## 2023-11-16 ENCOUNTER — Ambulatory Visit: Payer: Medicare Other

## 2023-11-16 ENCOUNTER — Encounter: Payer: Self-pay | Admitting: Family Medicine

## 2023-11-16 ENCOUNTER — Ambulatory Visit (INDEPENDENT_AMBULATORY_CARE_PROVIDER_SITE_OTHER): Payer: Medicare Other | Admitting: Family Medicine

## 2023-11-16 VITALS — BP 128/74 | HR 97 | Temp 98.0°F | Resp 18 | Ht 65.5 in | Wt 158.0 lb

## 2023-11-16 DIAGNOSIS — M79604 Pain in right leg: Secondary | ICD-10-CM

## 2023-11-16 DIAGNOSIS — M85861 Other specified disorders of bone density and structure, right lower leg: Secondary | ICD-10-CM | POA: Diagnosis not present

## 2023-11-16 DIAGNOSIS — W19XXXA Unspecified fall, initial encounter: Secondary | ICD-10-CM

## 2023-11-16 DIAGNOSIS — Y92009 Unspecified place in unspecified non-institutional (private) residence as the place of occurrence of the external cause: Secondary | ICD-10-CM

## 2023-11-16 DIAGNOSIS — S8991XA Unspecified injury of right lower leg, initial encounter: Secondary | ICD-10-CM | POA: Diagnosis not present

## 2023-11-16 DIAGNOSIS — G40909 Epilepsy, unspecified, not intractable, without status epilepticus: Secondary | ICD-10-CM | POA: Diagnosis not present

## 2023-11-16 DIAGNOSIS — M25561 Pain in right knee: Secondary | ICD-10-CM | POA: Diagnosis not present

## 2023-11-16 DIAGNOSIS — D6869 Other thrombophilia: Secondary | ICD-10-CM

## 2023-11-16 DIAGNOSIS — M25461 Effusion, right knee: Secondary | ICD-10-CM | POA: Diagnosis not present

## 2023-11-16 DIAGNOSIS — I4819 Other persistent atrial fibrillation: Secondary | ICD-10-CM

## 2023-11-16 MED ORDER — LEVETIRACETAM 500 MG PO TABS: 500.0000 mg | ORAL_TABLET | Freq: Two times a day (BID) | ORAL | 0 refills | Status: DC

## 2023-11-16 NOTE — Progress Notes (Signed)
 SUBJECTIVE:   Chief Complaint  Patient presents with   Knee Pain    Right leg after fall X 3-4 days   HPI Presents for acute visit Discussed the use of AI scribe software for clinical note transcription with the patient, who gave verbal consent to proceed.  History of Present Illness Miss Carla Flowers, a patient on Eliquis , presented with a history of a fall seven days ago. The fall resulted in significant bruising and pain in the right leg, which has been causing sleep disturbances due to the severity of the discomfort. The initial impact was on the knee, which was initially blue but has since subsided. The bruising extended down to the shin over time, despite the shin not being directly impacted during the fall. The patient has been managing the pain with Tylenol . The swelling in the leg has decreased somewhat but is still present. The patient reports that the pain is more severe in the shin area than in the knee. Despite the pain and bruising, the patient has retained mobility in the knee. Denies any LOC, weakness, seizure like activity, heart palpitations,  chest pain, SOB prior to fall.  Unwitnessed fall.  Did not hit head.     PERTINENT PMH / PSH: As above  OBJECTIVE:  BP 128/74   Pulse 97   Temp 98 F (36.7 C)   Resp 18   Ht 5' 5.5 (1.664 m)   Wt 158 lb (71.7 kg)   SpO2 93%   BMI 25.89 kg/m    Physical Exam Vitals reviewed.  Constitutional:      General: She is not in acute distress.    Appearance: Normal appearance. She is normal weight. She is not ill-appearing, toxic-appearing or diaphoretic.  Eyes:     General:        Right eye: No discharge.        Left eye: No discharge.     Conjunctiva/sclera: Conjunctivae normal.  Cardiovascular:     Rate and Rhythm: Normal rate and regular rhythm.     Heart sounds: Normal heart sounds.  Pulmonary:     Effort: Pulmonary effort is normal.     Breath sounds: Normal breath sounds.  Abdominal:     General: Bowel sounds are  normal.  Musculoskeletal:        General: Normal range of motion.     Right knee: Swelling, effusion and ecchymosis (healing in various stages) present. No deformity or erythema.     Instability Tests: Anterior drawer test negative. Posterior drawer test negative. Anterior Lachman test negative. Medial McMurray test negative and lateral McMurray test negative.     Left knee: Normal.     Right lower leg: Tenderness (right lower mid anterior shin) present. No deformity, lacerations or bony tenderness.  Skin:    General: Skin is warm and dry.  Neurological:     General: No focal deficit present.     Mental Status: She is alert and oriented to person, place, and time. Mental status is at baseline.  Psychiatric:        Mood and Affect: Mood normal.        Behavior: Behavior normal.        Thought Content: Thought content normal.        Judgment: Judgment normal.           11/16/2023    9:55 AM 11/01/2023    9:18 AM 08/16/2023    4:33 PM 06/17/2023    3:16 PM  Depression  screen PHQ 2/9  Decreased Interest 0 0 0 0  Down, Depressed, Hopeless 0 0 0 0  PHQ - 2 Score 0 0 0 0  Altered sleeping 0 1 0 0  Tired, decreased energy 1 1 1 1   Change in appetite 0 0 0 0  Feeling bad or failure about yourself  0 0 0 0  Trouble concentrating 0 0 0 0  Moving slowly or fidgety/restless 0 0 0 0  Suicidal thoughts 0 0 0 0  PHQ-9 Score 1 2 1 1   Difficult doing work/chores Not difficult at all Somewhat difficult Not difficult at all Not difficult at all      11/16/2023    9:55 AM 08/16/2023    4:34 PM 06/17/2023    3:16 PM  GAD 7 : Generalized Anxiety Score  Nervous, Anxious, on Edge 1 0 0  Control/stop worrying 0 0 0  Worry too much - different things 0 0 0  Trouble relaxing 0 0 0  Restless 0 0 0  Easily annoyed or irritable 0 0 0  Afraid - awful might happen 0 0 0  Total GAD 7 Score 1 0 0  Anxiety Difficulty Not difficult at all Not difficult at all Not difficult at all     ASSESSMENT/PLAN:  Right leg pain Assessment & Plan: Patient fell 7 days ago, landing on the front part of her knee. She has been experiencing pain in the knee and shin, with the shin pain being more severe despite not having directly injured it. She has been managing the pain with Tylenol . She is also on Eliquis , which may have contributed to the extensive bruising. -Order X-ray of the knee and leg to rule out any fractures given age and unwitnessed fall. -Continue symptomatic management.  Ortho referral pending xray results.  Orders: -     DG Tibia/Fibula Right; Future -     DG Knee Complete 4 Views Right; Future  Fall at home, initial encounter -     DG Tibia/Fibula Right; Future -     DG Knee Complete 4 Views Right; Future  Seizure disorder [facility]) Assessment & Plan: Has been on Keppra  for years.  No recent seizure activity.   Has not seen Neurology for evaluation of continued medication.   Suspect will need to continue but would like Neuro input given not clear etiology of seizure like activity and was suspected to have been dx in [place]  many years ago. -Refill Keppra  500 mg BID.  Recent Levels normal  Orders: -     levETIRAcetam ; Take 1 tablet (500 mg total) by mouth 2 (two) times daily.  Dispense: 60 tablet; Refill: 0  Hypercoagulable state due to persistent atrial fibrillation [facility]) Assessment & Plan: Patient is currently on Eliquis , which may have contributed to the extensive bruising following her fall. -Continue Eliquis  as prescribed. Monitor for any signs of excessive bleeding.     PDMP reviewed  Return if symptoms worsen or fail to improve, for PCP.  Carla Ferrari, MD

## 2023-11-16 NOTE — Patient Instructions (Addendum)
 It was a pleasure meeting you today. Thank you for allowing me to take part in your health care.  Our goals for today as we discussed include:  Xray right knee and lower leg  Continue Tylenol  500 mg every 6 hours for pain Elevate leg when sitting Bruising will take some time to resolve   This is a list of the screening recommended for you and due dates:  Health Maintenance  Topic Date Due   DEXA scan (bone density measurement)  Never done   COVID-19 Vaccine (3 - 2024-25 season) 07/04/2023   Medicare Annual Wellness Visit  10/31/2024   Pneumonia Vaccine  Completed   Flu Shot  Completed   HPV Vaccine  Aged Out   DTaP/Tdap/Td vaccine  Discontinued   Zoster (Shingles) Vaccine  Discontinued    Follow up as needed  If you have any questions or concerns, please do not hesitate to call the office at [phone].  I look forward to our next visit and until then take care and stay safe.  Regards,   Glenys Ferrari, MD   [facility]

## 2023-11-18 ENCOUNTER — Ambulatory Visit: Payer: Medicare Other | Admitting: Neurology

## 2023-11-18 ENCOUNTER — Encounter: Payer: Self-pay | Admitting: Neurology

## 2023-11-24 ENCOUNTER — Encounter: Payer: Self-pay | Admitting: Obstetrics

## 2023-11-24 ENCOUNTER — Encounter: Payer: Medicare Other | Admitting: Obstetrics

## 2023-11-28 ENCOUNTER — Encounter: Payer: Self-pay | Admitting: Family Medicine

## 2023-11-28 DIAGNOSIS — M79604 Pain in right leg: Secondary | ICD-10-CM | POA: Insufficient documentation

## 2023-11-28 NOTE — Assessment & Plan Note (Signed)
Patient is currently on Eliquis, which may have contributed to the extensive bruising following her fall. -Continue Eliquis as prescribed. Monitor for any signs of excessive bleeding.

## 2023-11-28 NOTE — Assessment & Plan Note (Addendum)
Patient fell 7 days ago, landing on the front part of her knee. She has been experiencing pain in the knee and shin, with the shin pain being more severe despite not having directly injured it. She has been managing the pain with Tylenol. She is also on Eliquis, which may have contributed to the extensive bruising. -Order X-ray of the knee and leg to rule out any fractures given age and unwitnessed fall. -Continue symptomatic management.  Ortho referral pending xray results.

## 2023-11-28 NOTE — Assessment & Plan Note (Signed)
Has been on Keppra for years.  No recent seizure activity.   Has not seen Neurology for evaluation of continued medication.   Suspect will need to continue but would like Neuro input given not clear etiology of seizure like activity and was suspected to have been dx in [place] many years ago. -Refill Keppra 500 mg BID.  Recent Levels normal

## 2023-12-10 ENCOUNTER — Other Ambulatory Visit: Payer: Self-pay | Admitting: Family Medicine

## 2023-12-10 DIAGNOSIS — G8929 Other chronic pain: Secondary | ICD-10-CM

## 2023-12-10 DIAGNOSIS — K219 Gastro-esophageal reflux disease without esophagitis: Secondary | ICD-10-CM

## 2023-12-14 ENCOUNTER — Ambulatory Visit (INDEPENDENT_AMBULATORY_CARE_PROVIDER_SITE_OTHER): Payer: Medicare Other

## 2023-12-14 DIAGNOSIS — I4819 Other persistent atrial fibrillation: Secondary | ICD-10-CM

## 2023-12-14 DIAGNOSIS — I495 Sick sinus syndrome: Secondary | ICD-10-CM | POA: Diagnosis not present

## 2023-12-15 LAB — CUP PACEART REMOTE DEVICE CHECK
Battery Remaining Longevity: 106 mo
Battery Remaining Percentage: 89 %
Battery Voltage: 2.99 V
Brady Statistic AP VP Percent: 7.4 %
Brady Statistic AP VS Percent: 12 %
Brady Statistic AS VP Percent: 1.7 %
Brady Statistic AS VS Percent: 79 %
Brady Statistic RA Percent Paced: 7.5 %
Brady Statistic RV Percent Paced: 21 %
Date Time Interrogation Session: 20250211023028
Implantable Lead Connection Status: 753985
Implantable Lead Connection Status: 753985
Implantable Lead Implant Date: 20230814
Implantable Lead Implant Date: 20230814
Implantable Lead Location: 753859
Implantable Lead Location: 753860
Implantable Pulse Generator Implant Date: 20230814
Lead Channel Impedance Value: 450 Ohm
Lead Channel Impedance Value: 450 Ohm
Lead Channel Pacing Threshold Amplitude: 0.5 V
Lead Channel Pacing Threshold Amplitude: 0.625 V
Lead Channel Pacing Threshold Pulse Width: 0.5 ms
Lead Channel Pacing Threshold Pulse Width: 0.5 ms
Lead Channel Sensing Intrinsic Amplitude: 1.2 mV
Lead Channel Sensing Intrinsic Amplitude: 12 mV
Lead Channel Setting Pacing Amplitude: 0.875
Lead Channel Setting Pacing Amplitude: 2.5 V
Lead Channel Setting Pacing Pulse Width: 0.5 ms
Lead Channel Setting Sensing Sensitivity: 2 mV
Pulse Gen Model: 2272
Pulse Gen Serial Number: 6843316

## 2023-12-16 ENCOUNTER — Encounter: Payer: Self-pay | Admitting: Cardiology

## 2023-12-24 ENCOUNTER — Telehealth: Payer: Self-pay

## 2023-12-24 NOTE — Telephone Encounter (Signed)
Copied from CRM [id number]. Topic: General - Other >> Dec 24, 2023 10:58 AM Elizebeth Brooking wrote: Reason for CRM: Patient called in stating she would like to speak with Dr.Walsh or her nurse, is requesting a callback

## 2023-12-27 ENCOUNTER — Telehealth: Payer: Self-pay

## 2023-12-27 NOTE — Telephone Encounter (Signed)
 Copied from CRM [id number]. Topic: Clinical - Medication Question >> Dec 27, 2023  9:01 AM Fredrich Romans wrote: Reason for CRM: Patient called in stating that Dr Clent Ridges wants her to see a neurologist to continue receiving medication levETIRAcetam (KEPPRA) 500 MG tablet. She said that with her being 89 years old,she's not up for all of the testing that the neurologist is going to do.And she would like to know if Dr Clent Ridges will still be able to prescribe this medication for her without seeing a neurologist?

## 2023-12-29 ENCOUNTER — Other Ambulatory Visit: Payer: Self-pay | Admitting: Family Medicine

## 2023-12-29 DIAGNOSIS — G40909 Epilepsy, unspecified, not intractable, without status epilepticus: Secondary | ICD-10-CM

## 2023-12-29 MED ORDER — LEVETIRACETAM 500 MG PO TABS: 500.0000 mg | ORAL_TABLET | Freq: Two times a day (BID) | ORAL | 3 refills | Status: AC

## 2023-12-30 NOTE — Telephone Encounter (Signed)
 Left message to return call to our office.

## 2023-12-31 NOTE — Telephone Encounter (Signed)
 Called pt and she stated the pharmacy called her and let her know.

## 2024-01-07 ENCOUNTER — Ambulatory Visit
Admission: RE | Admit: 2024-01-07 | Discharge: 2024-01-07 | Disposition: A | Discharge status: Home / Self Care | Source: Ambulatory Visit | Attending: Family Medicine | Admitting: Family Medicine

## 2024-01-07 ENCOUNTER — Encounter: Payer: Self-pay | Admitting: Family Medicine

## 2024-01-07 ENCOUNTER — Ambulatory Visit: Admitting: Family Medicine

## 2024-01-07 VITALS — BP 100/60 | HR 90 | Temp 97.5°F | Resp 20 | Ht 65.5 in | Wt 160.0 lb

## 2024-01-07 DIAGNOSIS — G40909 Epilepsy, unspecified, not intractable, without status epilepticus: Secondary | ICD-10-CM | POA: Diagnosis not present

## 2024-01-07 DIAGNOSIS — I6782 Cerebral ischemia: Secondary | ICD-10-CM | POA: Diagnosis not present

## 2024-01-07 DIAGNOSIS — I495 Sick sinus syndrome: Secondary | ICD-10-CM | POA: Diagnosis not present

## 2024-01-07 DIAGNOSIS — M5382 Other specified dorsopathies, cervical region: Secondary | ICD-10-CM

## 2024-01-07 DIAGNOSIS — N390 Urinary tract infection, site not specified: Secondary | ICD-10-CM | POA: Diagnosis not present

## 2024-01-07 DIAGNOSIS — Z950 Presence of cardiac pacemaker: Secondary | ICD-10-CM | POA: Diagnosis not present

## 2024-01-07 DIAGNOSIS — Z7901 Long term (current) use of anticoagulants: Secondary | ICD-10-CM | POA: Diagnosis not present

## 2024-01-07 DIAGNOSIS — R195 Other fecal abnormalities: Secondary | ICD-10-CM

## 2024-01-07 DIAGNOSIS — R202 Paresthesia of skin: Secondary | ICD-10-CM | POA: Diagnosis not present

## 2024-01-07 DIAGNOSIS — R200 Anesthesia of skin: Secondary | ICD-10-CM | POA: Insufficient documentation

## 2024-01-07 DIAGNOSIS — R531 Weakness: Secondary | ICD-10-CM | POA: Diagnosis not present

## 2024-01-07 LAB — POC URINALSYSI DIPSTICK (AUTOMATED)
Bilirubin, UA: NEGATIVE
Blood, UA: NEGATIVE
Glucose, UA: NEGATIVE
Ketones, UA: NEGATIVE
Nitrite, UA: NEGATIVE
Protein, UA: NEGATIVE
Spec Grav, UA: 1.02 (ref 1.010–1.025)
Urobilinogen, UA: 0.2 U/dL
pH, UA: 6 (ref 5.0–8.0)

## 2024-01-07 LAB — COMPREHENSIVE METABOLIC PANEL
ALT: 9 U/L (ref 0–35)
AST: 14 U/L (ref 0–37)
Albumin: 4.1 g/dL (ref 3.5–5.2)
Alkaline Phosphatase: 53 U/L (ref 39–117)
BUN: 12 mg/dL (ref 6–23)
CO2: 27 meq/L (ref 19–32)
Calcium: 9.8 mg/dL (ref 8.4–10.5)
Chloride: 104 meq/L (ref 96–112)
Creatinine, Ser: 1.02 mg/dL (ref 0.40–1.20)
GFR: 48.58 mL/min — ABNORMAL LOW (ref >60.00)
Glucose, Bld: 112 mg/dL — ABNORMAL HIGH (ref 70–99)
Potassium: 4.1 meq/L (ref 3.5–5.1)
Sodium: 142 meq/L (ref 135–145)
Total Bilirubin: 0.5 mg/dL (ref 0.2–1.2)
Total Protein: 7.2 g/dL (ref 6.0–8.3)

## 2024-01-07 LAB — CBC WITH DIFFERENTIAL/PLATELET
Basophils Absolute: 0.1 10*3/uL (ref 0.0–0.1)
Basophils Relative: 0.8 % (ref 0.0–3.0)
Eosinophils Absolute: 0.1 10*3/uL (ref 0.0–0.7)
Eosinophils Relative: 2.2 % (ref 0.0–5.0)
HCT: 46.9 % — ABNORMAL HIGH (ref 36.0–46.0)
Hemoglobin: 15.3 g/dL — ABNORMAL HIGH (ref 12.0–15.0)
Lymphocytes Relative: 29.4 % (ref 12.0–46.0)
Lymphs Abs: 2 10*3/uL (ref 0.7–4.0)
MCHC: 32.6 g/dL (ref 30.0–36.0)
MCV: 91.2 fl (ref 78.0–100.0)
Monocytes Absolute: 0.5 10*3/uL (ref 0.1–1.0)
Monocytes Relative: 7.5 % (ref 3.0–12.0)
Neutro Abs: 4.1 10*3/uL (ref 1.4–7.7)
Neutrophils Relative %: 60.1 % (ref 43.0–77.0)
Platelets: 300 10*3/uL (ref 150.0–400.0)
RBC: 5.14 Mil/uL — ABNORMAL HIGH (ref 3.87–5.11)
RDW: 14.8 % (ref 11.5–15.5)
WBC: 6.7 10*3/uL (ref 4.0–10.5)

## 2024-01-07 LAB — TSH: TSH: 2.63 u[IU]/mL (ref 0.35–5.50)

## 2024-01-07 NOTE — Assessment & Plan Note (Addendum)
 Reports of pacemaker "flipping" and occasional needle-like sensation. -Recommend follow-up with cardiologist for pacemaker evaluation. -EKG: atrial fibrillation, rate 81, paced.

## 2024-01-07 NOTE — Assessment & Plan Note (Signed)
 ICD/pacemaker Follows with Dr Jess Barters Cardizem 180 mg BID  Has prn Cardizem 60 mg but has not required use - Recent episode of weakness.  Will obtain ECG today

## 2024-01-07 NOTE — Patient Instructions (Addendum)
 It was a pleasure meeting you today. Thank you for allowing me to take part in your health care.  Our goals for today as we discussed include:  [facility] [address]  [place]  We will get some labs today.  If they are abnormal or we need to do something about them, I will call you.  If they are normal, I will send you a message on MyChart (if it is active) or a letter in the mail.  If you don't hear from [place] in 2 weeks, please call the office at the number below.   Will notify you of results of brain imaging   No changes to EKG from previous.    This is a list of the screening recommended for you and due dates:  Health Maintenance  Topic Date Due   DEXA scan (bone density measurement)  Never done   COVID-19 Vaccine (3 - 2024-25 season) 07/04/2023   Medicare Annual Wellness Visit  10/31/2024   Pneumonia Vaccine  Completed   Flu Shot  Completed   HPV Vaccine  Aged Out   DTaP/Tdap/Td vaccine  Discontinued   Zoster (Shingles) Vaccine  Discontinued       If you have any questions or concerns, please do not hesitate to call the office at [phone].  I look forward to our next visit and until then take care and stay safe.  Regards,   Dana Allan, MD   [facility]

## 2024-01-07 NOTE — Progress Notes (Signed)
 SUBJECTIVE:   Chief Complaint  Patient presents with   Insomnia    Last night and when she sat on the side of the bed she said it felt like muscle in her neck went numb. She stated that he head dropped and when she pick it up it dropped again.   Nausea    X 6 weeks every now and then   HPI Presents for acute visit  Discussed the use of AI scribe software for clinical note transcription with the patient, who gave verbal consent to proceed.  History of Present Illness The patient is a 89 year old with a history of seizures who presents with sudden muscle weakness and head drop. She is accompanied by her daughter.  This morning, while sitting on the side of her bed, she experienced sudden muscle weakness and a sensation of losing control. Her head dropped involuntarily, and she felt as if she might pass out, though she did not lose consciousness. 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 No numbness, tingling, difficulty speaking, slurred speech, drooping, or vision problems. The sensation was similar to a chill or quiver, primarily affecting her upper body, but she did not feel cold. She recalls a similar episode in the past but is unsure of the details.  She has a history of seizures and is currently taking Keppra twice a day. She is concerned about potential side effects of Keppra, including gastrointestinal issues, and mentions that her Keppra levels have not been checked recently.  She reports issues with her pacemaker, describing a sensation of it flipping and causing a needle-like feeling. She has not seen her cardiologist in a couple of months but has a home monitoring device.  She did not sleep at all last night, which is unusual for her, although she typically has poor sleep quality, waking up multiple times a night. She took Tylenol at 5 AM to help with sleep but did not experience any pain.  She mentions a recent episode of dark-colored bowel  movements, which she attributes to hemorrhoids, and denies any recent falls or significant changes in her health. She experiences occasional vertigo and has a history of gallbladder issues.  She reports urinary issues, feeling the need to urinate more frequently than she is able to, but denies any recent pain with urination. She previously submitted a urine sample a couple of months ago but has not had recent testing.    PERTINENT PMH / PSH: As above  OBJECTIVE:  BP 100/60   Pulse 90   Temp (!) 97.5 F (36.4 C)   Resp 20   Ht 5' 5.5" (1.664 m)   Wt 160 lb (72.6 kg)   SpO2 94%   BMI 26.22 kg/m    Physical Exam Vitals reviewed.  Constitutional:      General: She is not in acute distress.    Appearance: Normal appearance. She is not ill-appearing.  HENT:     Head: Normocephalic.     Nose: Nose normal.     Mouth/Throat:     Mouth: Mucous membranes are moist.  Eyes:     Extraocular Movements: Extraocular movements intact.     Conjunctiva/sclera: Conjunctivae normal.     Pupils: Pupils are equal, round, and reactive to light.  Neck:     Thyroid: No thyromegaly or thyroid tenderness.     Vascular: No carotid bruit.  Cardiovascular:     Rate and Rhythm: Normal rate. Rhythm  irregular.     Pulses: Normal pulses.     Heart sounds: Normal heart sounds.  Pulmonary:     Effort: Pulmonary effort is normal.     Breath sounds: Normal breath sounds.  Abdominal:     General: Bowel sounds are normal. There is no distension.     Tenderness: There is no abdominal tenderness. There is no right CVA tenderness, left CVA tenderness, guarding or rebound.  Musculoskeletal:        General: Normal range of motion.     Cervical back: Normal range of motion. No tenderness.     Right lower leg: No edema.     Left lower leg: No edema.  Lymphadenopathy:     Cervical: No cervical adenopathy.  Skin:    Capillary Refill: Capillary refill takes less than 2 seconds.  Neurological:     General: No  focal deficit present.     Mental Status: She is alert and oriented to person, place, and time. Mental status is at baseline.     Cranial Nerves: No cranial nerve deficit.     Sensory: Sensation is intact. No sensory deficit.     Motor: Motor function is intact. No weakness.     Coordination: Coordination is intact. Coordination normal.     Gait: Gait is intact. Gait normal.  Psychiatric:        Mood and Affect: Mood normal.        Behavior: Behavior normal.        Thought Content: Thought content normal.        Judgment: Judgment normal.           01/07/2024    9:51 AM 11/16/2023    9:55 AM 11/01/2023    9:18 AM 08/16/2023    4:33 PM 06/17/2023    3:16 PM  Depression screen PHQ 2/9  Decreased Interest 0 0 0 0 0  Down, Depressed, Hopeless 0 0 0 0 0  PHQ - 2 Score 0 0 0 0 0  Altered sleeping 1 0 1 0 0  Tired, decreased energy 1 1 1 1 1   Change in appetite 0 0 0 0 0  Feeling bad or failure about yourself  0 0 0 0 0  Trouble concentrating 0 0 0 0 0  Moving slowly or fidgety/restless 0 0 0 0 0  Suicidal thoughts 0 0 0 0 0  PHQ-9 Score 2 1 2 1 1   Difficult doing work/chores Not difficult at all Not difficult at all Somewhat difficult Not difficult at all Not difficult at all      11/16/2023    9:55 AM 08/16/2023    4:34 PM 06/17/2023    3:16 PM  GAD 7 : Generalized Anxiety Score  Nervous, Anxious, on Edge 1 0 0  Control/stop worrying 0 0 0  Worry too much - different things 0 0 0  Trouble relaxing 0 0 0  Restless 0 0 0  Easily annoyed or irritable 0 0 0  Afraid - awful might happen 0 0 0  Total GAD 7 Score 1 0 0  Anxiety Difficulty Not difficult at all Not difficult at all Not difficult at all    ASSESSMENT/PLAN:  Weakness of neck Assessment & Plan: Acute onset of head drop and generalized shaking No associated numbness, tingling, slurred speech, vision changes, or loss of consciousness. Episode lasting few seconds.  Possible seizure activity vs. muscle fatigue. No  recent falls or head trauma. Given use of anticoagulant and advancing  age cannot r/o TIA -Order Keppra level to assess for therapeutic dosing. -Order CT scan of the brain without contrast to rule out intracranial bleeding or stroke. On eliquis.   -Refer to neurology for further evaluation and management of potential seizure activity. -Check Cmet, CBC, TSH  Orders: -     CBC with Differential/Platelet -     Comprehensive metabolic panel -     TSH -     CT HEAD WO CONTRAST ( )  Seizure disorder (HCC) Assessment & Plan: Has been on Keppra for years. Recent generalized body movements, witnessed.  Patient does not feel had seizure.  No LOC, aware of events and no loss of bowel or bladder. Has not seen Neurology for evaluation of continued medication.  Previously cancelled appointment.  Patient to call to see if can reschedule.  Mat need referral resent. Would like Neuro input given not clear etiology of seizure like activity and was suspected to have been dx in [place] many years ago. -Refill Keppra 500 mg BID. -Check Keppra level    Recurrent UTI Assessment & Plan: No urinary discomfort -POC urine positive for leuks -Send for culture   Orders: -     POCT Urinalysis Dipstick (Automated) -     Urine Culture  Tachycardia-bradycardia [facility]) Assessment & Plan: ICD/pacemaker Follows with Dr Jess Barters Cardizem 180 mg BID  Has prn Cardizem 60 mg but has not required use - Recent episode of weakness.  Will obtain ECG today  Orders: -     EKG 12-Lead  Dark stools Assessment & Plan: Reports of dark stool and frequent bowel movements. History of hemorrhoids. -Collect stool sample for occult blood testing.   Orders: -     Fecal occult blood, imunochemical; Future  Pacemaker Assessment & Plan: Reports of pacemaker "flipping" and occasional needle-like sensation. -Recommend follow-up with cardiologist for pacemaker evaluation. -EKG: atrial fibrillation, rate 81, paced.      PDMP reviewed  Return if symptoms worsen or fail to improve, for PCP.  Dana Allan, MD

## 2024-01-07 NOTE — Assessment & Plan Note (Signed)
 No urinary discomfort -POC urine positive for leuks -Send for culture

## 2024-01-07 NOTE — Assessment & Plan Note (Signed)
 Has been on Keppra for years. Recent generalized body movements, witnessed.  Patient does not feel had seizure.  No LOC, aware of events and no loss of bowel or bladder. Has not seen Neurology for evaluation of continued medication.  Previously cancelled appointment.  Patient to call to see if can reschedule.  Mat need referral resent. Would like Neuro input given not clear etiology of seizure like activity and was suspected to have been dx in [place] many years ago. -Refill Keppra 500 mg BID. -Check Keppra level

## 2024-01-07 NOTE — Assessment & Plan Note (Addendum)
 Acute onset of head drop and generalized shaking No associated numbness, tingling, slurred speech, vision changes, or loss of consciousness. Episode lasting few seconds.  Possible seizure activity vs. muscle fatigue. No recent falls or head trauma. Given use of anticoagulant and advancing age cannot r/o TIA -Order Keppra level to assess for therapeutic dosing. -Order CT scan of the brain without contrast to rule out intracranial bleeding or stroke. On eliquis.   -Refer to neurology for further evaluation and management of potential seizure activity. -Check Cmet, CBC, TSH

## 2024-01-07 NOTE — Assessment & Plan Note (Signed)
 Reports of dark stool and frequent bowel movements. History of hemorrhoids. -Collect stool sample for occult blood testing.

## 2024-01-08 LAB — URINE CULTURE
MICRO NUMBER:: 16173672
SPECIMEN QUALITY:: ADEQUATE

## 2024-01-24 NOTE — Progress Notes (Signed)
 Remote pacemaker transmission.

## 2024-01-24 NOTE — Addendum Note (Signed)
 Addended by: Geralyn Flash D on: 01/24/2024 02:11 PM   Modules accepted: Orders

## 2024-01-27 ENCOUNTER — Ambulatory Visit (INDEPENDENT_AMBULATORY_CARE_PROVIDER_SITE_OTHER)

## 2024-01-27 DIAGNOSIS — R195 Other fecal abnormalities: Secondary | ICD-10-CM | POA: Diagnosis not present

## 2024-01-27 LAB — FECAL OCCULT BLOOD, IMMUNOCHEMICAL: Fecal Occult Bld: NEGATIVE

## 2024-02-14 ENCOUNTER — Telehealth: Payer: Self-pay | Admitting: Gastroenterology

## 2024-02-14 NOTE — Telephone Encounter (Signed)
 Error

## 2024-03-06 DIAGNOSIS — Z0000 Encounter for general adult medical examination without abnormal findings: Secondary | ICD-10-CM | POA: Diagnosis not present

## 2024-03-06 DIAGNOSIS — R7989 Other specified abnormal findings of blood chemistry: Secondary | ICD-10-CM | POA: Diagnosis not present

## 2024-03-06 DIAGNOSIS — I480 Paroxysmal atrial fibrillation: Secondary | ICD-10-CM | POA: Diagnosis not present

## 2024-03-06 DIAGNOSIS — R5383 Other fatigue: Secondary | ICD-10-CM | POA: Diagnosis not present

## 2024-03-06 DIAGNOSIS — N1831 Chronic kidney disease, stage 3a: Secondary | ICD-10-CM | POA: Diagnosis not present

## 2024-03-06 DIAGNOSIS — G40909 Epilepsy, unspecified, not intractable, without status epilepticus: Secondary | ICD-10-CM | POA: Diagnosis not present

## 2024-03-14 ENCOUNTER — Ambulatory Visit (INDEPENDENT_AMBULATORY_CARE_PROVIDER_SITE_OTHER): Payer: Medicare Other

## 2024-03-14 DIAGNOSIS — I495 Sick sinus syndrome: Secondary | ICD-10-CM | POA: Diagnosis not present

## 2024-03-14 LAB — CUP PACEART REMOTE DEVICE CHECK
Battery Remaining Longevity: 104 mo
Battery Remaining Percentage: 86 %
Battery Voltage: 2.99 V
Brady Statistic AP VP Percent: 7.4 %
Brady Statistic AP VS Percent: 12 %
Brady Statistic AS VP Percent: 1.7 %
Brady Statistic AS VS Percent: 79 %
Brady Statistic RA Percent Paced: 6.2 %
Brady Statistic RV Percent Paced: 23 %
Date Time Interrogation Session: 20250513020014
Implantable Lead Connection Status: 753985
Implantable Lead Connection Status: 753985
Implantable Lead Implant Date: 20230814
Implantable Lead Implant Date: 20230814
Implantable Lead Location: 753859
Implantable Lead Location: 753860
Implantable Pulse Generator Implant Date: 20230814
Lead Channel Impedance Value: 490 Ohm
Lead Channel Impedance Value: 490 Ohm
Lead Channel Pacing Threshold Amplitude: 0.5 V
Lead Channel Pacing Threshold Amplitude: 0.75 V
Lead Channel Pacing Threshold Pulse Width: 0.5 ms
Lead Channel Pacing Threshold Pulse Width: 0.5 ms
Lead Channel Sensing Intrinsic Amplitude: 1.2 mV
Lead Channel Sensing Intrinsic Amplitude: 12 mV
Lead Channel Setting Pacing Amplitude: 1 V
Lead Channel Setting Pacing Amplitude: 2.5 V
Lead Channel Setting Pacing Pulse Width: 0.5 ms
Lead Channel Setting Sensing Sensitivity: 2 mV
Pulse Gen Model: 2272
Pulse Gen Serial Number: 6843316

## 2024-03-19 ENCOUNTER — Ambulatory Visit: Payer: Self-pay | Admitting: Cardiology

## 2024-03-31 NOTE — Progress Notes (Unsigned)
 Electrophysiology Clinic Note    Date:  04/04/2024  Patient ID:  Carla Flowers, Carla Flowers 11/30/1931, MRN [id number] PCP:  Valli Gaw, MD  Cardiologist:  None Electrophysiologist: Boyce Byes, MD   Discussed the use of AI scribe software for clinical note transcription with the patient, who gave verbal consent to proceed.   Patient Profile    Chief Complaint: PPM follow-up  History of Present Illness: Carla Flowers is a 89 y.o. female with PMH notable for persis AFib, aflutter, tachy-brady s/p PPM, rheumatic fever, MVP, HTN, CKD-III; seen today for Boyce Byes, MD for routine electrophysiology followup.   She last saw PA Fenton 02/2023 where she was dizzy and fatigued while out of rhythm, burden at that time was 28%. She was happy with her treatment and did not want to start AAD.   On follow-up today, she continues to have episodes of palpitations, and rarely takes her PRN dilt. She does continue to take her 180mg  dilt BID without missed doses. She is minimally active because she she walks around her HR races, and decreases with rest. She has increased ankle swelling during the days, is reduced in the AM when she wakes up. She occasionally has SOB and fatigue, which has been occurring for many years. She remains active in her home. She rarely checks BP, checked it earlier this week in preparation for appt and it was 118/70.  She has occasional "pins and needles" sensation around her PPM, especially at night when laying on L side. The sensation is uncomfortable, but not painful.   No bleeding concerns on eliquis .      Arrhythmia/Device History St. Jude dual chamber PPM, imp 06/2022; dx SND   AAD -  Flecanide - stopped d/t conduction dz Amio - stopped d/t concerns for SE    ROS:  Please see the history of present illness. All other systems are reviewed and otherwise negative.    Physical Exam    VS:  BP (!) 149/83   Pulse 78   Ht 5' 5.5" (1.664 m)   Wt 162  lb 12.8 oz (73.8 kg)   SpO2 93%   BMI 26.68 kg/m  BMI: Body mass index is 26.68 kg/m.  Wt Readings from Last 3 Encounters:  04/04/24 162 lb 12.8 oz (73.8 kg)  01/07/24 160 lb (72.6 kg)  11/16/23 158 lb (71.7 kg)     GEN- The patient is well appearing, alert and oriented x 3 today.   Lungs- Clear to ausculation bilaterally, normal work of breathing.  Heart- Irregularly irregular rate and rhythm, no murmurs, rubs or gallops Extremities- 1-2+ peripheral edema, warm, dry Skin-  device pocket well-healed, no tethering   Device interrogation done today and reviewed by myself:  Battery 8-9 years Lead thresholds, impedence, sensing stable  100% AF since 06/2023 Ventricular HR histograms slightly elevated Adjusted mode to VVI    Studies Reviewed   Previous EP, cardiology notes.    EKG is ordered. Personal review of EKG from today shows:    EKG Interpretation Date/Time:  Tuesday April 04 2024 10:51:21 EDT Ventricular Rate:  78 PR Interval:    QRS Duration:  126 QT Interval:  392 QTC Calculation: 446 R Axis:   -36  Text Interpretation: Atrial fibrillation  intermittent ventricular pacing Confirmed by Nanami Whitelaw [id number]) on 04/04/2024 4:16:47 PM    TTE, 05/26/2020  1. Left ventricular ejection fraction, by estimation, is 60 to 65%. The left ventricle has normal function. The left  ventricle has no regional wall motion abnormalities. There is moderate left ventricular hypertrophy. Left ventricular diastolic parameters are consistent with Grade II diastolic dysfunction (pseudonormalization). Elevated left ventricular end-diastolic pressure.   2. Right ventricular systolic function is normal. The right ventricular size is normal. There is normal pulmonary artery systolic pressure.   3. Left atrial size was moderately dilated.   4. The mitral valve is abnormal. Mild mitral valve regurgitation.   5. The aortic valve is tricuspid. Aortic valve regurgitation is trivial. Mild aortic valve  sclerosis is present, with no evidence of aortic valve stenosis.   6. The inferior vena cava is normal in size with greater than 50% respiratory variability, suggesting right atrial pressure of 3 mmHg.    Assessment and Plan     #) persis Afib #) aflutter #) HTN #) lower extremity edema Patient appears to only be aware of her atrial arrhythmia when her ventricular rates are uncontrolled She has been taking 180mg  diltiazem  BID without s/s hypotension Prior to increasing dilt dose, I've asked her ot check BP more regularly to ensure it is as robust as her in-office reading Update TTE, if LVEF reduced will switch to amiodarone   #) Hypercoag d/t persis afib CHA2DS2-VASc Score = at least 4 [CHF History: 0, HTN History: 1, Diabetes History: 0, Stroke History: 0, Vascular Disease History: 0, Age Score: 2, Gender Score: 1].  Therefore, the patient's annual risk of stroke is 4.8 %.    Stroke ppx - 5mg  eliquis  BID, appropriately dosed No bleeding concerns  #) SND s/p PPM Device functioning well, see paceart for details      Current medicines are reviewed at length with the patient today.   The patient has concerns regarding her medicines.  The following changes were made today:  none  Labs/ tests ordered today include:  Orders Placed This Encounter  Procedures   EKG 12-Lead   ECHOCARDIOGRAM COMPLETE     Disposition: Follow up with EP APP in 2 months    Signed, Braedyn Riggle, NP  04/04/24  4:19 PM  Electrophysiology [facility]

## 2024-04-02 ENCOUNTER — Encounter: Payer: Self-pay | Admitting: Cardiology

## 2024-04-02 DIAGNOSIS — I4892 Unspecified atrial flutter: Secondary | ICD-10-CM | POA: Insufficient documentation

## 2024-04-04 ENCOUNTER — Encounter: Payer: Self-pay | Admitting: Cardiology

## 2024-04-04 ENCOUNTER — Ambulatory Visit: Attending: Cardiology | Admitting: Cardiology

## 2024-04-04 VITALS — BP 149/83 | HR 78 | Ht 65.5 in | Wt 162.8 lb

## 2024-04-04 DIAGNOSIS — I484 Atypical atrial flutter: Secondary | ICD-10-CM | POA: Diagnosis not present

## 2024-04-04 DIAGNOSIS — Z950 Presence of cardiac pacemaker: Secondary | ICD-10-CM | POA: Diagnosis not present

## 2024-04-04 DIAGNOSIS — D6869 Other thrombophilia: Secondary | ICD-10-CM

## 2024-04-04 DIAGNOSIS — I4819 Other persistent atrial fibrillation: Secondary | ICD-10-CM

## 2024-04-04 DIAGNOSIS — I495 Sick sinus syndrome: Secondary | ICD-10-CM

## 2024-04-04 LAB — CUP PACEART INCLINIC DEVICE CHECK
Battery Remaining Longevity: 111 mo
Battery Voltage: 2.99 V
Brady Statistic RA Percent Paced: 5.9 %
Brady Statistic RV Percent Paced: 24 %
Date Time Interrogation Session: 20250603163356
Implantable Lead Connection Status: 753985
Implantable Lead Connection Status: 753985
Implantable Lead Implant Date: 20230814
Implantable Lead Implant Date: 20230814
Implantable Lead Location: 753859
Implantable Lead Location: 753860
Implantable Pulse Generator Implant Date: 20230814
Lead Channel Impedance Value: 487.5 Ohm
Lead Channel Impedance Value: 512.5 Ohm
Lead Channel Pacing Threshold Amplitude: 0.5 V
Lead Channel Pacing Threshold Amplitude: 0.75 V
Lead Channel Pacing Threshold Amplitude: 0.75 V
Lead Channel Pacing Threshold Pulse Width: 0.5 ms
Lead Channel Pacing Threshold Pulse Width: 0.5 ms
Lead Channel Pacing Threshold Pulse Width: 0.5 ms
Lead Channel Sensing Intrinsic Amplitude: 1.9 mV
Lead Channel Sensing Intrinsic Amplitude: 12 mV
Lead Channel Setting Pacing Amplitude: 1 V
Lead Channel Setting Pacing Pulse Width: 0.5 ms
Lead Channel Setting Sensing Sensitivity: 2 mV
Pulse Gen Model: 2272
Pulse Gen Serial Number: 6843316

## 2024-04-04 NOTE — Patient Instructions (Addendum)
 Medication Instructions:  Your Physician recommend you continue on your current medication as directed.    *If you need a refill on your cardiac medications before your next appointment, please call your pharmacy*  Lab Work: None ordered at this time  If you have labs (blood work) drawn today and your tests are completely normal, you will receive your results only by: MyChart Message (if you have MyChart) OR A paper copy in the mail If you have any lab test that is abnormal or we need to change your treatment, we will call you to review the results.  Testing/Procedures: Your physician has requested that you have an echocardiogram. Echocardiography is a painless test that uses sound waves to create images of your heart. It provides your doctor with information about the size and shape of your heart and how well your heart's chambers and valves are working.   You may receive an ultrasound enhancing agent through an IV if needed to better visualize your heart during the echo. This procedure takes approximately one hour.  There are no restrictions for this procedure.  This will take place at [address] [facility] [address], [place] [address]  Please note: We ask at that you not bring children with you during ultrasound (echo/ vascular) testing. Due to room size and safety concerns, children are not allowed in the ultrasound rooms during exams. Our front office staff cannot provide observation of children in our lobby area while testing is being conducted. An adult accompanying a patient to their appointment will only be allowed in the ultrasound room at the discretion of the ultrasound technician under special circumstances. We apologize for any inconvenience.   Follow-Up: At [facility], you and your health needs are our priority.  As part of our continuing mission to provide you with exceptional heart care, our providers are all part of one team.  This team includes  your primary Cardiologist (physician) and Advanced Practice Providers or APPs (Physician Assistants and Nurse Practitioners) who all work together to provide you with the care you need, when you need it.  Your next appointment:   2-3 month(s)  Provider:   Suzann Riddle, NP    We recommend signing up for the patient portal called "MyChart".  Sign up information is provided on this After Visit Summary.  MyChart is used to connect with patients for Virtual Visits (Telemedicine).  Patients are able to view lab/test results, encounter notes, upcoming appointments, etc.  Non-urgent messages can be sent to your provider as well.   To learn more about what you can do with MyChart, go to [email or web address].   Other Instructions Blood Pressure log given - please take your BP daily

## 2024-04-08 ENCOUNTER — Ambulatory Visit: Payer: Self-pay | Admitting: Cardiology

## 2024-04-13 ENCOUNTER — Telehealth: Payer: Self-pay

## 2024-04-13 NOTE — Telephone Encounter (Signed)
 Patient voiced concern that her heart rate on pulse ox is going into the 40's and 60's. Patient is currently asymptomatic. Explained to patient that she is in AF and the pulse ox is not able to decipher the irregular rhythm. Patient reassured the pacemaker is working as it should and heart rate will not go below 60 bpm. Patient reports recent GI bug but has improved over last 2 days. Is able to stay well hydrated as she reports drinking 64 oz. Of water daily.   Patient advised to call if she has further questions or concerns. Was appreciative of call back.

## 2024-04-13 NOTE — Telephone Encounter (Signed)
 Pt called in concerned that her heart rate has been in the low 60's and she is concerned because its not supposed to get that low. Pt will send in a transmission and would like a call back

## 2024-04-20 ENCOUNTER — Ambulatory Visit: Attending: Cardiology

## 2024-04-20 DIAGNOSIS — Z950 Presence of cardiac pacemaker: Secondary | ICD-10-CM | POA: Diagnosis not present

## 2024-04-20 DIAGNOSIS — I4819 Other persistent atrial fibrillation: Secondary | ICD-10-CM | POA: Diagnosis not present

## 2024-04-20 DIAGNOSIS — I484 Atypical atrial flutter: Secondary | ICD-10-CM

## 2024-04-20 DIAGNOSIS — I495 Sick sinus syndrome: Secondary | ICD-10-CM | POA: Diagnosis not present

## 2024-04-20 DIAGNOSIS — D6869 Other thrombophilia: Secondary | ICD-10-CM

## 2024-04-20 LAB — ECHOCARDIOGRAM COMPLETE
AR max vel: 2.38 cm2
AV Area VTI: 2.6 cm2
AV Area mean vel: 2.24 cm2
AV Mean grad: 3 mmHg
AV Peak grad: 5.6 mmHg
Ao pk vel: 1.18 m/s
Calc EF: 47.5 %
P 1/2 time: 592 ms
S' Lateral: 3.3 cm
Single Plane A2C EF: 53 %
Single Plane A4C EF: 40.7 %

## 2024-04-28 NOTE — Progress Notes (Signed)
 Remote pacemaker transmission.

## 2024-05-30 ENCOUNTER — Telehealth: Payer: Self-pay | Admitting: *Deleted

## 2024-05-30 NOTE — Telephone Encounter (Signed)
 Needs TOC From [employer]

## 2024-06-13 ENCOUNTER — Ambulatory Visit (INDEPENDENT_AMBULATORY_CARE_PROVIDER_SITE_OTHER): Payer: Medicare Other

## 2024-06-13 DIAGNOSIS — I495 Sick sinus syndrome: Secondary | ICD-10-CM

## 2024-06-14 LAB — CUP PACEART REMOTE DEVICE CHECK
Battery Remaining Longevity: 115 mo
Battery Remaining Percentage: 84 %
Battery Voltage: 3.01 V
Brady Statistic RV Percent Paced: 7.2 %
Date Time Interrogation Session: 20250812025532
Implantable Lead Connection Status: 753985
Implantable Lead Connection Status: 753985
Implantable Lead Implant Date: 20230814
Implantable Lead Implant Date: 20230814
Implantable Lead Location: 753859
Implantable Lead Location: 753860
Implantable Pulse Generator Implant Date: 20230814
Lead Channel Impedance Value: 530 Ohm
Lead Channel Pacing Threshold Amplitude: 0.75 V
Lead Channel Pacing Threshold Pulse Width: 0.5 ms
Lead Channel Sensing Intrinsic Amplitude: 12 mV
Lead Channel Setting Pacing Amplitude: 1 V
Lead Channel Setting Pacing Pulse Width: 0.5 ms
Lead Channel Setting Sensing Sensitivity: 2 mV
Pulse Gen Model: 2272
Pulse Gen Serial Number: 6843316

## 2024-06-15 ENCOUNTER — Ambulatory Visit: Payer: Self-pay | Admitting: Cardiology

## 2024-06-21 NOTE — Progress Notes (Unsigned)
 Electrophysiology Clinic Note    Date:  06/22/2024  Patient ID:  Carla Flowers, Carla Flowers 1931/12/29, MRN [id number] PCP:  Dorcus Lamar POUR, MD  Cardiologist:  None   Electrophysiologist:  OLE ONEIDA HOLTS, MD  Electrophysiology APP:  Brylin Stanislawski, NP    Discussed the use of AI scribe software for clinical note transcription with the patient, who gave verbal consent to proceed.   Patient Profile    Chief Complaint: Afib follow-up  History of Present Illness: Carla Flowers is a 89 y.o. female with PMH notable for persis AFib, aflutter, tachy-brady s/p PPM, rheumatic fever, MVP, HTN, CKD-III ; seen today for OLE ONEIDA HOLTS, MD for routine electrophysiology followup.   I last saw her 04/2024 where she was having intermittent palpitations and rarely taking PRN dilt. She was in AFib during that appt, appeared to be persistent by PPM. Her BP during visit was borderline, so recommended she check more regularly at home. She was having increased edema during the days, reduced by morning. Also having SOB, fatigue.  We discussed updating TTE, and if LVEF reduced, initiating amiodarone . LVEF 45-50%,  but patient did not want to start amiodarone .   On follow-up today, she has not had any significant palpitation episodes since she was last seen. She checks her pulse and it runs 60-70s at rest, and will rise to 100-110 when she is active. She had not needed her PRN diltiazem  at all.  Her lower extremity swelling has been well controlled the past several weeks.  She checks her BP regularly, systolics running 100-110s.  She denies chest pain, chest pressure, SOB, dizziness, LH or presyncope.  She continues to take eliquis  BID. She has stable hemorrhoids and intermittently has blood on TP after BM. This is a long-standing problem.    Arrhythmia/Device History St. Jude dual chamber PPM, imp 06/2022; dx SND   AAD -  Flecanide - stopped d/t conduction dz Amio - stopped d/t concerns for  SE    ROS:  Please see the history of present illness. All other systems are reviewed and otherwise negative.    Physical Exam    VS:  BP 110/60 (BP Location: Left Arm, Patient Position: Sitting, Cuff Size: Normal)   Pulse 78   Ht 5' 5 (1.651 m)   Wt 161 lb 6.4 oz (73.2 kg)   SpO2 94%   BMI 26.86 kg/m  BMI: Body mass index is 26.86 kg/m.      Wt Readings from Last 3 Encounters:  06/22/24 161 lb 6.4 oz (73.2 kg)  04/04/24 162 lb 12.8 oz (73.8 kg)  01/07/24 160 lb (72.6 kg)     GEN- The patient is well appearing, alert and oriented x 3 today.   Lungs- Clear to ausculation bilaterally, normal work of breathing.  Heart- Irregularly irregular rate and rhythm, no murmurs, rubs or gallops Extremities- Trace peripheral edema, warm, dry Skin-  device pocket well-healed, no tethering   8/12 remote interrogation reviewed done today and reviewed by myself:  Battery 9+ years Programmed VVI Lead thresholds, impedence, sensing stable  VP 7.2%    Studies Reviewed   Previous EP, cardiology notes.    EKG is ordered. Personal review of EKG from today shows:    EKG Interpretation Date/Time:  Thursday June 22 2024 10:34:22 EDT Ventricular Rate:  77 PR Interval:    QRS Duration:  118 QT Interval:  404 QTC Calculation: 457 R Axis:   -27  Text Interpretation: Atrial fibrillation with frequent  ventricular-paced complexes Left ventricular hypertrophy with QRS widening and repolarization abnormality ( R in aVL , Sokolow-Lyon , Cornell product , Romhilt-Estes ) Anteroseptal infarct , age undetermined Confirmed by Deiona Hooper [phone]) on 06/22/2024 10:36:02 AM    TTE, 04/20/2024  1. Left ventricular ejection fraction, by estimation, is 45 to 50%. Left ventricular ejection fraction by 2D MOD biplane is 47.5 %. The left ventricle has mildly decreased function. The left ventricle has no regional wall motion abnormalities. There is mild left ventricular hypertrophy. Left ventricular  diastolic parameters are indeterminate.   2. Right ventricular systolic function is mildly reduced. The right ventricular size is moderately enlarged. There is normal pulmonary artery systolic pressure. The estimated right ventricular systolic pressure is 35.9 mmHg.   3. Left atrial size was severely dilated.   4. The mitral valve is normal in structure. Mild to moderate mitral valve regurgitation. No evidence of mitral stenosis.   5. Tricuspid valve regurgitation is moderate.   6. The aortic valve is normal in structure. Aortic valve regurgitation is mild. Aortic valve sclerosis is present, with no evidence of aortic valve stenosis. Aortic valve mean gradient measures 3.0 mmHg.   7. The inferior vena cava is normal in size with greater than 50% respiratory variability, suggesting right atrial pressure of 3 mmHg.   TTE, 05/26/2020  1. Left ventricular ejection fraction, by estimation, is 60 to 65%. The left ventricle has normal function. The left ventricle has no regional wall motion abnormalities. There is moderate left ventricular hypertrophy. Left ventricular diastolic parameters are consistent with Grade II diastolic dysfunction (pseudonormalization). Elevated left ventricular end-diastolic pressure.   2. Right ventricular systolic function is normal. The right ventricular size is normal. There is normal pulmonary artery systolic pressure.   3. Left atrial size was moderately dilated.   4. The mitral valve is abnormal. Mild mitral valve regurgitation.   5. The aortic valve is tricuspid. Aortic valve regurgitation is trivial. Mild aortic valve sclerosis is present, with no evidence of aortic valve stenosis.   6. The inferior vena cava is normal in size with greater than 50% respiratory variability, suggesting right atrial pressure of 3 mmHg.    Assessment and Plan     #) long-standing persis AFib #) HFmrEF Long discussion with patient regarding elevated ventricular rates being potential cause  of her mid-range LVEF.  Unable to increase diltiazem  d/t borderline BP She overall feels well and does not want to make any medication changes at this time. She is not interested in starting amiodarone  or trailing any different medications  #) Hypercoag d/t persis afib CHA2DS2-VASc Score = at least 4 [CHF History: 0, HTN History: 1, Diabetes History: 0, Stroke History: 0, Vascular Disease History: 0, Age Score: 2, Gender Score: 1].  Therefore, the patient's annual risk of stroke is 4.8 %.    Stroke ppx - 5mg  eliquis  BID, appropriately dosed Stable bleeding hemorrhoids Recent H/H stable  #) tachy-brady s/p PPM Low VP at 7% Device functioning well, see paceart for details       Current medicines are reviewed at length with the patient today.   The patient does not have concerns regarding her medicines.  The following changes were made today:  none  Labs/ tests ordered today include:  Orders Placed This Encounter  Procedures   EKG 12-Lead     Disposition: Follow up with Dr. Cindie or EP APP in 12 months   Signed, Chantal Needle, NP  06/22/24  11:08 AM  Electrophysiology [facility]

## 2024-06-22 ENCOUNTER — Ambulatory Visit: Attending: Cardiology | Admitting: Cardiology

## 2024-06-22 VITALS — BP 110/60 | HR 78 | Ht 65.0 in | Wt 161.4 lb

## 2024-06-22 DIAGNOSIS — I495 Sick sinus syndrome: Secondary | ICD-10-CM

## 2024-06-22 DIAGNOSIS — I4819 Other persistent atrial fibrillation: Secondary | ICD-10-CM | POA: Diagnosis not present

## 2024-06-22 DIAGNOSIS — D6869 Other thrombophilia: Secondary | ICD-10-CM | POA: Diagnosis not present

## 2024-06-22 DIAGNOSIS — Z950 Presence of cardiac pacemaker: Secondary | ICD-10-CM

## 2024-06-22 NOTE — Patient Instructions (Signed)
 Medication Instructions:  The current medical regimen is effective;  continue present plan and medications as directed. Please refer to the Current Medication list given to you today.   *If you need a refill on your cardiac medications before your next appointment, please call your pharmacy*  Follow-Up: At [facility], you and your health needs are our priority.  As part of our continuing mission to provide you with exceptional heart care, our providers are all part of one team.  This team includes your primary Cardiologist (physician) and Advanced Practice Providers or APPs (Physician Assistants and Nurse Practitioners) who all work together to provide you with the care you need, when you need it.  Your next appointment:   12 month(s)  Provider:   Steffanie Dunn, MD or Sherie Don, NP    We recommend signing up for the patient portal called "MyChart".  Sign up information is provided on this After Visit Summary.  MyChart is used to connect with patients for Virtual Visits (Telemedicine).  Patients are able to view lab/test results, encounter notes, upcoming appointments, etc.  Non-urgent messages can be sent to your provider as well.   To learn more about what you can do with MyChart, go to [email or web address].

## 2024-06-28 DIAGNOSIS — L738 Other specified follicular disorders: Secondary | ICD-10-CM | POA: Diagnosis not present

## 2024-06-28 DIAGNOSIS — L720 Epidermal cyst: Secondary | ICD-10-CM | POA: Diagnosis not present

## 2024-06-28 DIAGNOSIS — L821 Other seborrheic keratosis: Secondary | ICD-10-CM | POA: Diagnosis not present

## 2024-06-28 DIAGNOSIS — Z85828 Personal history of other malignant neoplasm of skin: Secondary | ICD-10-CM | POA: Diagnosis not present

## 2024-07-27 NOTE — Progress Notes (Signed)
 Remote PPM Transmission

## 2024-08-18 ENCOUNTER — Other Ambulatory Visit: Payer: Self-pay | Admitting: Cardiology

## 2024-08-21 MED ORDER — DILTIAZEM HCL ER COATED BEADS 180 MG PO CP24: 180.0000 mg | ORAL_CAPSULE | Freq: Two times a day (BID) | ORAL | 3 refills | Status: AC

## 2024-09-12 ENCOUNTER — Ambulatory Visit: Payer: Medicare Other

## 2024-09-12 DIAGNOSIS — I4819 Other persistent atrial fibrillation: Secondary | ICD-10-CM

## 2024-09-13 LAB — CUP PACEART REMOTE DEVICE CHECK
Battery Remaining Longevity: 113 mo
Battery Remaining Percentage: 82 %
Battery Voltage: 3.01 V
Brady Statistic RV Percent Paced: 8.8 %
Date Time Interrogation Session: 20251111023617
Implantable Lead Connection Status: 753985
Implantable Lead Connection Status: 753985
Implantable Lead Implant Date: 20230814
Implantable Lead Implant Date: 20230814
Implantable Lead Location: 753859
Implantable Lead Location: 753860
Implantable Pulse Generator Implant Date: 20230814
Lead Channel Impedance Value: 530 Ohm
Lead Channel Pacing Threshold Amplitude: 0.75 V
Lead Channel Pacing Threshold Pulse Width: 0.5 ms
Lead Channel Sensing Intrinsic Amplitude: 11.8 mV
Lead Channel Setting Pacing Amplitude: 1 V
Lead Channel Setting Pacing Pulse Width: 0.5 ms
Lead Channel Setting Sensing Sensitivity: 2 mV
Pulse Gen Model: 2272
Pulse Gen Serial Number: 6843316

## 2024-09-15 NOTE — Progress Notes (Signed)
 Remote PPM Transmission

## 2024-09-24 ENCOUNTER — Ambulatory Visit: Payer: Self-pay | Admitting: Cardiology

## 2024-11-06 ENCOUNTER — Ambulatory Visit: Payer: Medicare Other

## 2024-12-12 ENCOUNTER — Ambulatory Visit: Payer: Medicare Other

## 2025-03-13 ENCOUNTER — Ambulatory Visit: Payer: Medicare Other
